# Patient Record
Sex: Female | Born: 1965 | Race: White | Hispanic: No | Marital: Married | Smoking: Never smoker
Health system: Southern US, Community
[De-identification: ages and names within clinical notes are randomized; demographics above are authoritative.]

## PROBLEM LIST (undated history)

## (undated) DIAGNOSIS — F32A Depression, unspecified: Secondary | ICD-10-CM

## (undated) DIAGNOSIS — K219 Gastro-esophageal reflux disease without esophagitis: Secondary | ICD-10-CM

## (undated) DIAGNOSIS — F329 Major depressive disorder, single episode, unspecified: Secondary | ICD-10-CM

## (undated) DIAGNOSIS — D649 Anemia, unspecified: Secondary | ICD-10-CM

## (undated) DIAGNOSIS — F419 Anxiety disorder, unspecified: Secondary | ICD-10-CM

## (undated) HISTORY — DX: Gastro-esophageal reflux disease without esophagitis: K21.9

## (undated) HISTORY — DX: Anemia, unspecified: D64.9

## (undated) HISTORY — DX: Depression, unspecified: F32.A

## (undated) HISTORY — DX: Major depressive disorder, single episode, unspecified: F32.9

## (undated) HISTORY — DX: Anxiety disorder, unspecified: F41.9

---

## 2005-09-28 ENCOUNTER — Ambulatory Visit: Payer: Self-pay | Admitting: Internal Medicine

## 2006-04-21 ENCOUNTER — Ambulatory Visit: Payer: Self-pay | Admitting: Internal Medicine

## 2006-05-09 ENCOUNTER — Inpatient Hospital Stay (HOSPITAL_COMMUNITY): Admission: AD | Admit: 2006-05-09 | Discharge: 2006-05-11 | Payer: Self-pay | Admitting: Internal Medicine

## 2006-05-09 ENCOUNTER — Ambulatory Visit: Payer: Self-pay | Admitting: Internal Medicine

## 2006-05-10 ENCOUNTER — Ambulatory Visit: Payer: Self-pay | Admitting: Internal Medicine

## 2006-05-18 ENCOUNTER — Ambulatory Visit: Payer: Self-pay | Admitting: Internal Medicine

## 2006-12-22 ENCOUNTER — Ambulatory Visit: Payer: Self-pay | Admitting: Internal Medicine

## 2006-12-22 LAB — CONVERTED CEMR LAB
Basophils Absolute: 0.1 10*3/uL (ref 0.0–0.1)
Basophils Relative: 1.4 % — ABNORMAL HIGH (ref 0.0–1.0)
Eosinophils Absolute: 0.5 10*3/uL (ref 0.0–0.6)
Eosinophils Relative: 8.9 % — ABNORMAL HIGH (ref 0.0–5.0)
HCT: 29.5 % — ABNORMAL LOW (ref 36.0–46.0)
Hemoglobin: 9.7 g/dL — ABNORMAL LOW (ref 12.0–15.0)
Iron: 18 ug/dL — ABNORMAL LOW (ref 42–145)
Lymphocytes Relative: 35.1 % (ref 12.0–46.0)
MCHC: 33 g/dL (ref 30.0–36.0)
MCV: 70.5 fL — ABNORMAL LOW (ref 78.0–100.0)
Monocytes Absolute: 0.5 10*3/uL (ref 0.2–0.7)
Monocytes Relative: 8.7 % (ref 3.0–11.0)
Neutro Abs: 2.6 10*3/uL (ref 1.4–7.7)
Neutrophils Relative %: 45.9 % (ref 43.0–77.0)
Platelets: 313 10*3/uL (ref 150–400)
RBC: 4.24 M/uL (ref 3.87–5.11)
RDW: 15.4 % — ABNORMAL HIGH (ref 11.5–14.6)
TSH: 2.39 microintl units/mL (ref 0.35–5.50)
WBC: 5.7 10*3/uL (ref 4.5–10.5)

## 2007-07-28 ENCOUNTER — Encounter: Payer: Self-pay | Admitting: *Deleted

## 2007-07-28 DIAGNOSIS — K2900 Acute gastritis without bleeding: Secondary | ICD-10-CM | POA: Insufficient documentation

## 2007-07-28 DIAGNOSIS — Z8719 Personal history of other diseases of the digestive system: Secondary | ICD-10-CM | POA: Insufficient documentation

## 2007-07-28 DIAGNOSIS — K2100 Gastro-esophageal reflux disease with esophagitis, without bleeding: Secondary | ICD-10-CM | POA: Insufficient documentation

## 2007-07-28 DIAGNOSIS — M797 Fibromyalgia: Secondary | ICD-10-CM | POA: Insufficient documentation

## 2007-07-28 DIAGNOSIS — K449 Diaphragmatic hernia without obstruction or gangrene: Secondary | ICD-10-CM | POA: Insufficient documentation

## 2007-07-28 DIAGNOSIS — Z87898 Personal history of other specified conditions: Secondary | ICD-10-CM | POA: Insufficient documentation

## 2007-07-28 DIAGNOSIS — K219 Gastro-esophageal reflux disease without esophagitis: Secondary | ICD-10-CM | POA: Insufficient documentation

## 2007-07-28 DIAGNOSIS — F329 Major depressive disorder, single episode, unspecified: Secondary | ICD-10-CM | POA: Insufficient documentation

## 2007-08-09 ENCOUNTER — Ambulatory Visit: Payer: Self-pay | Admitting: Internal Medicine

## 2007-08-09 DIAGNOSIS — F418 Other specified anxiety disorders: Secondary | ICD-10-CM | POA: Insufficient documentation

## 2007-08-09 DIAGNOSIS — F411 Generalized anxiety disorder: Secondary | ICD-10-CM | POA: Insufficient documentation

## 2007-08-09 DIAGNOSIS — D509 Iron deficiency anemia, unspecified: Secondary | ICD-10-CM | POA: Insufficient documentation

## 2007-08-10 LAB — CONVERTED CEMR LAB
BUN: 9 mg/dL (ref 6–23)
Basophils Absolute: 0.1 10*3/uL (ref 0.0–0.1)
Basophils Relative: 1.4 % — ABNORMAL HIGH (ref 0.0–1.0)
CO2: 27 meq/L (ref 19–32)
Calcium: 9 mg/dL (ref 8.4–10.5)
Chloride: 106 meq/L (ref 96–112)
Creatinine, Ser: 0.9 mg/dL (ref 0.4–1.2)
Eosinophils Absolute: 0.3 10*3/uL (ref 0.0–0.7)
Eosinophils Relative: 4.2 % (ref 0.0–5.0)
GFR calc Af Amer: 89 mL/min
GFR calc non Af Amer: 74 mL/min
Glucose, Bld: 98 mg/dL (ref 70–99)
HCT: 32.4 % — ABNORMAL LOW (ref 36.0–46.0)
Hemoglobin: 10.3 g/dL — ABNORMAL LOW (ref 12.0–15.0)
Iron: 20 ug/dL — ABNORMAL LOW (ref 42–145)
Lymphocytes Relative: 27.7 % (ref 12.0–46.0)
MCHC: 31.9 g/dL (ref 30.0–36.0)
MCV: 69 fL — ABNORMAL LOW (ref 78.0–100.0)
Monocytes Absolute: 0.5 10*3/uL (ref 0.1–1.0)
Monocytes Relative: 8.4 % (ref 3.0–12.0)
Neutro Abs: 3.6 10*3/uL (ref 1.4–7.7)
Neutrophils Relative %: 58.3 % (ref 43.0–77.0)
Platelets: 344 10*3/uL (ref 150–400)
Potassium: 4.4 meq/L (ref 3.5–5.1)
RBC: 4.69 M/uL (ref 3.87–5.11)
RDW: 16.9 % — ABNORMAL HIGH (ref 11.5–14.6)
Saturation Ratios: 4 % — ABNORMAL LOW (ref 20.0–50.0)
Sodium: 140 meq/L (ref 135–145)
Transferrin: 358.6 mg/dL (ref >=212.0)
WBC: 6.2 10*3/uL (ref 4.5–10.5)

## 2008-02-13 ENCOUNTER — Telehealth: Payer: Self-pay | Admitting: Internal Medicine

## 2008-04-29 ENCOUNTER — Ambulatory Visit: Payer: Self-pay | Admitting: Internal Medicine

## 2008-04-29 ENCOUNTER — Encounter: Payer: Self-pay | Admitting: Internal Medicine

## 2008-04-29 DIAGNOSIS — R079 Chest pain, unspecified: Secondary | ICD-10-CM | POA: Insufficient documentation

## 2008-05-08 ENCOUNTER — Ambulatory Visit: Payer: Self-pay | Admitting: Internal Medicine

## 2008-09-04 ENCOUNTER — Telehealth: Payer: Self-pay | Admitting: Internal Medicine

## 2008-12-04 ENCOUNTER — Encounter: Payer: Self-pay | Admitting: Internal Medicine

## 2009-01-20 ENCOUNTER — Ambulatory Visit: Payer: Self-pay | Admitting: Internal Medicine

## 2009-01-20 DIAGNOSIS — R209 Unspecified disturbances of skin sensation: Secondary | ICD-10-CM | POA: Insufficient documentation

## 2009-01-20 DIAGNOSIS — R5383 Other fatigue: Secondary | ICD-10-CM | POA: Insufficient documentation

## 2009-01-23 LAB — CONVERTED CEMR LAB
ALT: 14 units/L (ref 0–35)
AST: 19 units/L (ref 0–37)
Albumin: 4 g/dL (ref 3.5–5.2)
Alkaline Phosphatase: 54 units/L (ref 39–117)
BUN: 10 mg/dL (ref 6–23)
Basophils Absolute: 0.1 10*3/uL (ref 0.0–0.1)
Basophils Relative: 0.7 % (ref 0.0–3.0)
Bilirubin Urine: NEGATIVE
Bilirubin, Direct: 0 mg/dL (ref 0.0–0.3)
CO2: 28 meq/L (ref 19–32)
Calcium: 9 mg/dL (ref 8.4–10.5)
Chloride: 105 meq/L (ref 96–112)
Creatinine, Ser: 0.8 mg/dL (ref 0.4–1.2)
Eosinophils Absolute: 0.2 10*3/uL (ref 0.0–0.7)
Eosinophils Relative: 2.9 % (ref 0.0–5.0)
Ferritin: 3 ng/mL — ABNORMAL LOW (ref 10.0–291.0)
GFR calc non Af Amer: 83.73 mL/min (ref >60)
Glucose, Bld: 80 mg/dL (ref 70–99)
HCT: 31.8 % — ABNORMAL LOW (ref 36.0–46.0)
Hemoglobin: 10.1 g/dL — ABNORMAL LOW (ref 12.0–15.0)
Ketones, ur: NEGATIVE mg/dL
Leukocytes, UA: NEGATIVE
Lymphocytes Relative: 28.5 % (ref 12.0–46.0)
Lymphs Abs: 2.3 10*3/uL (ref 0.7–4.0)
MCHC: 31.7 g/dL (ref 30.0–36.0)
MCV: 71.5 fL — ABNORMAL LOW (ref 78.0–100.0)
Monocytes Absolute: 0.6 10*3/uL (ref 0.1–1.0)
Monocytes Relative: 7.3 % (ref 3.0–12.0)
Neutro Abs: 4.9 10*3/uL (ref 1.4–7.7)
Neutrophils Relative %: 60.6 % (ref 43.0–77.0)
Nitrite: NEGATIVE
Platelets: 351 10*3/uL (ref 150.0–400.0)
Potassium: 3.8 meq/L (ref 3.5–5.1)
RBC: 4.45 M/uL (ref 3.87–5.11)
RDW: 15.1 % — ABNORMAL HIGH (ref 11.5–14.6)
Sed Rate: 26 mm/hr — ABNORMAL HIGH (ref 0–22)
Sodium: 141 meq/L (ref 135–145)
Specific Gravity, Urine: 1.025 (ref 1.000–1.030)
TSH: 3.19 microintl units/mL (ref 0.35–5.50)
Total Bilirubin: 0.7 mg/dL (ref 0.3–1.2)
Total Protein, Urine: NEGATIVE mg/dL
Total Protein: 7.8 g/dL (ref 6.0–8.3)
Urine Glucose: NEGATIVE mg/dL
Urobilinogen, UA: 0.2 (ref 0.0–1.0)
Vitamin B-12: 224 pg/mL (ref 211–911)
WBC: 8.1 10*3/uL (ref 4.5–10.5)
pH: 6 (ref 5.0–8.0)

## 2009-02-03 ENCOUNTER — Telehealth: Payer: Self-pay | Admitting: Internal Medicine

## 2009-02-06 ENCOUNTER — Ambulatory Visit: Payer: Self-pay | Admitting: Internal Medicine

## 2009-02-27 ENCOUNTER — Telehealth: Payer: Self-pay | Admitting: Internal Medicine

## 2010-06-04 NOTE — Progress Notes (Signed)
Summary: zoloft  Phone Note Other Incoming Call back at fax   [facility] [phone] Summary of Call: reqest #90 supply----ok. per dr. Derrill Kay Initial call taken by: Tora Perches,  February 27, 2009 2:29 PM    Prescriptions: ZOLOFT 100 MG TABS (SERTRALINE HCL) 1 by mouth daily  #90 x 3   Entered by:   Tora Perches   Authorized by:   [name] MD   Signed by:   Tora Perches on 02/27/2009   Method used:   [facility]        [facility]  [address] (retail)       [address]       [place], [place]  [address]       Ph: [phone] or [phone]       Fax: [phone]   RxID:   [id number]

## 2010-06-04 NOTE — Assessment & Plan Note (Signed)
Summary: ROA/AWARE OF FEE/NML   Vital Signs:  Patient Profile:   45 Years Old Female Weight:      206 pounds Temp:     96.8 degrees F oral Pulse rate:   81 / minute BP sitting:   121 / 88  (left arm)  Vitals Entered By: Tora Perches (August 09, 2007 11:33 AM)             Is Patient Diabetic? No     Chief Complaint:  Multiple medical problems or concerns.  History of Present Illness: The patient presents for a follow up of back pain, anemia, depression and headaches.     Current Allergies: No known allergies   Past Medical History:    Reviewed history from 07/28/2007 and no changes required:       GASTROINTESTINAL HEMORRHAGE, HX OF (ICD-V12.79)       Hx of GASTRITIS, ACUTE (ICD-535.00)       HIATAL HERNIA (ICD-553.3)       ESOPHAGITIS, REFLUX (ICD-530.11)       * PREMENSTRUAL DISORDER       HEADACHES, HX OF (ICD-V13.8)       FIBROMYALGIA (ICD-729.1)       DEPRESSION (ICD-311)       GERD (ICD-530.81)               Anemia-iron deficiency       Anxiety   Family History:    M OA, DM, HTN    F Parkinson's  Social History:    Occupation: Investment banker, operational    Married    Never Smoked   Risk Factors:  Tobacco use:  never    Physical Exam  General:     Well-developed,well-nourished,in no acute distress; alert,appropriate and cooperative throughout examination Eyes:     No corneal or conjunctival inflammation noted. EOMI. Perrla. Funduscopic exam benign, without hemorrhages, exudates or papilledema. Vision grossly normal. Ears:     External ear exam shows no significant lesions or deformities.  Otoscopic examination reveals clear canals, tympanic membranes are intact bilaterally without bulging, retraction, inflammation or discharge. Hearing is grossly normal bilaterally. Nose:     External nasal examination shows no deformity or inflammation. Nasal mucosa are pink and moist without lesions or exudates. Mouth:     Oral mucosa and oropharynx without lesions or  exudates.  Teeth in good repair. Neck:     No deformities, masses, or tenderness noted. Lungs:     Normal respiratory effort, chest expands symmetrically. Lungs are clear to auscultation, no crackles or wheezes. Heart:     Normal rate and regular rhythm. S1 and S2 normal without gallop, murmur, click, rub or other extra sounds. Abdomen:     Bowel sounds positive,abdomen soft and non-tender without masses, organomegaly or hernias noted. Msk:     No deformity or scoliosis noted of thoracic or lumbar spine.   Extremities:     No clubbing, cyanosis, edema, or deformity noted with normal full range of motion of all joints.   Neurologic:     No cranial nerve deficits noted. Station and gait are normal. Plantar reflexes are down-going bilaterally. DTRs are symmetrical throughout. Sensory, motor and coordinative functions appear intact. Skin:     Intact without suspicious lesions or rashes Psych:     Oriented X3, normally interactive, good eye contact, and depressed affect.      Impression & Recommendations:  Problem # 1:  DEPRESSION (ICD-311) Assessment: Deteriorated  Her updated medication list for this problem includes:  Fluoxetine Hcl 10 Mg Tabs (Fluoxetine hcl) .Marland Kitchen... Take once daily   Problem # 2:  GERD (ICD-530.81) Assessment: Unchanged  Her updated medication list for this problem includes:    Protonix 40 Mg Tbec (Pantoprazole sodium) .Marland Kitchen... Take once daily   Problem # 3:  ANEMIA-IRON DEFICIENCY (ICD-280.9) Assessment: Comment Only  Orders: TLB-CBC Platelet - w/Differential (85025-CBCD) TLB-BMP (Basic Metabolic Panel-BMET) (80048-METABOL) TLB-IBC Pnl(Iron/FE;Transferrin) (83550-IBC)   Problem # 4:  * PREMENSTRUAL DISORDER Assessment: Unchanged Gyn apt next wk  Complete Medication List: 1)  Fluoxetine Hcl 10 Mg Tabs (Fluoxetine hcl) .... Take once daily 2)  Protonix 40 Mg Tbec (Pantoprazole sodium) .... Take once daily 3)  Vitamin D3 1000 Unit Tabs  (Cholecalciferol) .Marland Kitchen.. 1 by mouth daily   Patient Instructions: 1)  Please schedule a follow-up appointment in 3 months.    Prescriptions: PROTONIX 40 MG  TBEC (PANTOPRAZOLE SODIUM) Take once daily  #30 x 12   Entered and Authorized by:   [name] MD   Signed by:   [name] MD on 08/09/2007   Method used:   Print then Give to Patient   RxID:   [id number] FLUOXETINE HCL 10 MG  TABS (FLUOXETINE HCL) Take once daily  #30 x 6   Entered and Authorized by:   [name] MD   Signed by:   [name] MD on 08/09/2007   Method used:   Print then Give to Patient   RxID:   [id number]  ]

## 2010-06-04 NOTE — Progress Notes (Signed)
Summary: Paperwork  Phone Note Call from Patient Call back at Home Phone [phone]   Caller: Daughter Call For: [name] MD Summary of Call: Patient's daughter dropped off paperwork to be completed by Dr. Posey Rea. It needs to be mailed in the enclosed envelope when completed. Gave to Triage A. Initial call taken by: Irma Newness,  February 03, 2009 4:05 PM  Follow-up for Phone Call        Put paperwork on md desk Follow-up by: Orlan Leavens,  February 05, 2009 8:27 AM  Additional Follow-up for Phone Call Additional follow up Details #1::        Done Additional Follow-up by: [name] MD,  February 06, 2009 4:26 PM    Additional Follow-up for Phone Call Additional follow up Details #2::    Paperwork mailed. Copy sent to medical records to scan into chart. Follow-up by: Irma Newness,  February 07, 2009 9:08 AM

## 2010-06-04 NOTE — Progress Notes (Signed)
Summary: Work Note  Phone Note From Other Clinic   Caller: Elizabeth HR at [employer] Ph# [phone] Summary of Call: PT's HR is requesting a note that states pt is unable to climb ladders at work b/c of dizzyness, to be faxed to [phone]. Note needs to state a date that this ends. Initial call taken by: Lamar Sprinkles,  February 13, 2008 9:37 AM  Follow-up for Phone Call        OK to give x 1 year. Thx Follow-up by: [name] MD,  February 13, 2008 11:50 AM  Additional Follow-up for Phone Call Additional follow up Details #1::        Note to be faxed today Additional Follow-up by: Lamar Sprinkles,  February 13, 2008 1:56 PM

## 2010-06-04 NOTE — Letter (Signed)
Summary: Out of Work  [facility]  [address] [place], [place] [address]   Phone: [phone]  Fax: [phone]    April 29, 2008   Employee:  Richel Nelva Bush    To Whom It May Concern:   For Medical reasons, please excuse the above named employee from work for the following dates:  Start:   sun April 28, 2008  End:   sat May 04, 2008 ---   to return to work sun May 05, 2008  If you need additional information, please feel free to contact our office.         Sincerely,    Corwin Levins MD

## 2010-06-04 NOTE — Progress Notes (Signed)
Summary: PROTONIX  Phone Note Refill Request Message from:  Fax from Pharmacy on Sep 04, 2008 1:54 PM  Refills Requested: Medication #1:  PROTONIX 40 MG  TBEC Take 1 once daily   [facility]: [facility] [address] [phone]   Method Requested: Electronic Initial call taken by: Orlan Leavens,  Sep 04, 2008 1:54 PM      Prescriptions: PROTONIX 40 MG  TBEC (PANTOPRAZOLE SODIUM) Take 1 once daily  #30 x 3   Entered by:   Orlan Leavens   Authorized by:   [name] MD   Signed by:   Orlan Leavens on 09/04/2008   Method used:   [facility]        [facility]  [address] (retail)       [address]       [place], [place]  [address]       Ph: [phone] or [phone]       Fax: [phone]   RxID:   [id number]

## 2010-06-04 NOTE — Miscellaneous (Signed)
Summary: sertraline  Clinical Lists Changes  Medications: Rx of ZOLOFT 50 MG TABS (SERTRALINE HCL) 1 by mouth qd;  #30 x 5;  Signed;  Entered by: Tora Perches;  Authorized by: [name] MD;  Method used: [facility]  [address] [place], [place]  [address], Ph: [phone] or [phone], Fax: [phone]    Prescriptions: ZOLOFT 50 MG TABS (SERTRALINE HCL) 1 by mouth qd  #30 x 5   Entered by:   Tora Perches   Authorized by:   [name] MD   Signed by:   Tora Perches on 12/04/2008   Method used:   [facility]ronically [facility]        [facility]  [address] (retail)       [address]       [place], [place]  [address]       Ph: [phone] or [phone]       Fax: [phone]   RxID:   [id number]

## 2010-06-04 NOTE — Assessment & Plan Note (Signed)
Summary: wants to "talk" to dr plot/$50/cd   Vital Signs:  Patient Profile:   45 Years Old Female Weight:      205 pounds Temp:     97.4 degrees F oral Pulse rate:   80 / minute BP sitting:   116 / 84  (left arm)  Vitals Entered By: Tora Perches (May 08, 2008 4:11 PM)                 Chief Complaint:  Multiple medical problems or concerns.  History of Present Illness: C/o stress. C/o anxiety, can't focus well. Depressed. Had a CP episode x 1 F/u CP    Current Allergies: No known allergies   Past Medical History:    Reviewed history from 04/29/2008 and no changes required:       GASTROINTESTINAL HEMORRHAGE, HX OF (ICD-V12.79)       Hx of GASTRITIS, ACUTE (ICD-535.00)       HIATAL HERNIA (ICD-553.3)       ESOPHAGITIS, REFLUX (ICD-530.11)       * PREMENSTRUAL DISORDER       HEADACHES, HX OF (ICD-V13.8)       FIBROMYALGIA (ICD-729.1)       DEPRESSION (ICD-311)       GERD (ICD-530.81)       Anemia-iron deficiency       Anxiety   Family History:    Reviewed history from 08/09/2007 and no changes required:       M OA, DM, HTN       F Parkinson's  Social History:    Reviewed history from 04/29/2008 and no changes required:       Occupation: Investment banker, 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   Nose:     External nasal examination shows no deformity or inflammation. Nasal mucosa are pink and moist without lesions or exudates. Mouth:     Oral mucosa and oropharynx without lesions or exudates.  Teeth in good repair. Neck:     No deformities, masses, or tenderness noted. Lungs:     Normal respiratory effort, chest expands symmetrically. Lungs are clear to auscultation, no crackles or wheezes. Heart:     Normal rate and regular rhythm. S1 and S2 normal without gallop, murmur, click, rub or other extra sounds. Abdomen:     Bowel  sounds positive,abdomen soft and non-tender without masses, organomegaly or hernias noted. Msk:     tender left lower costal margin and left upper chest wall tender  Neurologic:     No cranial nerve deficits noted. Station and gait are normal. Plantar reflexes are down-going bilaterally. DTRs are symmetrical throughout. Sensory, motor and coordinative functions appear intact. Skin:     Intact without suspicious lesions or rashes Psych:     flat affect and moderately anxious.  depressed affect.  Not suicidal    Impression & Recommendations:  Problem # 1:  CHEST PAIN (ICD-786.50) due to #1 resolved Assessment: Comment Only  Problem # 2:  ANXIETY (ICD-300.00) Assessment: Deteriorated Stress discussed The following medications were removed from the medication list:    Fluoxetine Hcl 10 Mg Tabs (Fluoxetine hcl) .Marland Kitchen... Take once daily  Her updated medication list for this problem includes:    Lorazepam 0.5 Mg Tabs (Lorazepam) .Marland KitchenMarland KitchenMarland KitchenMarland Kitchen  1-2 by mouth two times a day as needed anxiety    Zoloft 50 Mg Tabs (Sertraline hcl) .Marland Kitchen... 1 by mouth qd   Problem # 3:  ANEMIA-IRON DEFICIENCY (ICD-280.9)  Her updated medication list for this problem includes:    Ferrous Sulfate 325 (65 Fe) Mg Tbec (Ferrous sulfate) .Marland Kitchen... 1 by mouth once daily with 100 mg of vit c   Problem # 4:  FIBROMYALGIA (ICD-729.1) Assessment: Unchanged  Her updated medication list for this problem includes:    Naproxen 500 Mg Tabs (Naproxen) .Marland Kitchen... 1 by mouth two times a day as needed   Problem # 5:  DEPRESSION (ICD-311) Assessment: Deteriorated  The following medications were removed from the medication list:    Fluoxetine Hcl 10 Mg Tabs (Fluoxetine hcl) .Marland Kitchen... Take once daily  Her updated medication list for this problem includes:    Lorazepam 0.5 Mg Tabs (Lorazepam) .Marland Kitchen... 1-2 by mouth two times a day as needed anxiety    Zoloft 50 Mg Tabs (Sertraline hcl) .Marland Kitchen... 1 by mouth qd   Complete Medication List: 1)  Protonix  40 Mg Tbec (Pantoprazole sodium) .... Take 1 once daily 2)  Naproxen 500 Mg Tabs (Naproxen) .Marland Kitchen.. 1 by mouth two times a day as needed 3)  Lorazepam 0.5 Mg Tabs (Lorazepam) .Marland Kitchen.. 1-2 by mouth two times a day as needed anxiety 4)  Zoloft 50 Mg Tabs (Sertraline hcl) .Marland Kitchen.. 1 by mouth qd 5)  Vitamin D3 1000 Unit Tabs (Cholecalciferol) .Marland Kitchen.. 1 by mouth daily 6)  Ferrous Sulfate 325 (65 Fe) Mg Tbec (Ferrous sulfate) .Marland Kitchen.. 1 by mouth once daily with 100 mg of vit c   Patient Instructions: 1)  Please schedule a follow-up appointment in 3 months. 2)  BMP prior to visit, ICD-9: 3)  Hepatic Panel prior to visit, ICD-9: 4)  TSH prior to visit, ICD-9: 5)  CBC w/ Diff prior to visit, ICD-9: 6)  iron   Prescriptions: LORAZEPAM 0.5 MG TABS (LORAZEPAM) 1-2 by mouth two times a day as needed anxiety  #60 x 3   Entered and Authorized by:   [name] MD   Signed by:   [name] MD on 05/08/2008   Method used:   Print then Give to Patient   RxID:   [id number] PROTONIX 40 MG  TBEC (PANTOPRAZOLE SODIUM) Take 1 once daily  #90 x 3   Entered and Authorized by:   [name] MD   Signed by:   [name] MD on 05/08/2008   Method used:   Print then Give to Patient   RxID:   [id number] ZOLOFT 50 MG TABS (SERTRALINE HCL) 1 by mouth qd  #30 x 6   Entered and Authorized by:   [name] MD   Signed by:   [name] MD on 05/08/2008   Method used:   Print then Give to Patient   RxID:   [id number]  ]

## 2010-06-04 NOTE — Assessment & Plan Note (Signed)
Summary: depression/cd   Vital Signs:  Patient profile:   45 year old female Height:      62 inches Weight:      187 pounds BMI:     34.33 Temp:     97.3 degrees F oral Pulse rate:   80 / minute BP sitting:   124 / 84  (left arm)  Vitals Entered By: Tora Perches (January 20, 2009 4:54 PM) CC: depression Is Patient Diabetic? No   CC:  depression.  History of Present Illness: C/o fatigue and depression - worse, no energy She could not work for a wk due to weakness  Current Medications (verified): 1)  Protonix 40 Mg  Tbec (Pantoprazole Sodium) .... Take 1 Once Daily 2)  Naproxen 500 Mg Tabs (Naproxen) .Marland Kitchen.. 1 By Mouth Two Times A Day As Needed 3)  Lorazepam 0.5 Mg Tabs (Lorazepam) .Marland Kitchen.. 1-2 By Mouth Two Times A Day As Needed Anxiety 4)  Zoloft 50 Mg Tabs (Sertraline Hcl) .Marland Kitchen.. 1 By Mouth Qd 5)  Vitamin D3 1000 Unit  Tabs (Cholecalciferol) .Marland Kitchen.. 1 By Mouth Daily 6)  Ferrous Sulfate 325 (65 Fe) Mg Tbec (Ferrous Sulfate) .Marland Kitchen.. 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   Mouth:  Oral mucosa and oropharynx without lesions or exudates.  Teeth in good repair. Lungs:  Normal respiratory effort, chest expands symmetrically. Lungs are clear to auscultation, no crackles or wheezes. Heart:  Normal rate and regular rhythm. S1 and S2 normal without gallop, murmur, click, rub or other  extra sounds. Abdomen:  Bowel sounds positive,abdomen soft and non-tender without masses, organomegaly or hernias noted. Msk:  tender left lower costal margin and left upper chest wall tender  Extremities:  no edema, no ulcers  Neurologic:  No cranial nerve deficits noted. Station and gait are normal. Plantar reflexes are down-going bilaterally. DTRs are symmetrical throughout. Sensory, motor and coordinative functions appear intact. Skin:  Intact without suspicious lesions or rashes Psych:  flat affect and moderately anxious.  depressed affect.  Not suicidal   Impression & Recommendations:  Problem # 1:  FATIGUE (ICD-780.79) Assessment Comment Only On prescription therapy  Orders: TLB-B12, Serum-Total ONLY ([address]-U04) TLB-BMP (Basic Metabolic Panel-BMET) (80048-METABOL) TLB-CBC Platelet - w/Differential (85025-CBCD) TLB-Hepatic/Liver Function Pnl (80076-HEPATIC) TLB-Sedimentation Rate (ESR) (85652-ESR) TLB-TSH (Thyroid Stimulating Hormone) (84443-TSH) TLB-Udip ONLY (81003-UDIP) TLB-Ferritin (82728-FER)  Problem # 2:  FIBROMYALGIA (ICD-729.1) Assessment: Deteriorated  Her updated medication list for this problem includes:    Naproxen 500 Mg Tabs (Naproxen) .Marland Kitchen... 1 by mouth two times a day as needed    Cyclobenzaprine Hcl 10 Mg Tabs (Cyclobenzaprine hcl) .Marland Kitchen... 1 by mouth 2 times daily as needed for pain  Orders: TLB-B12, Serum-Total ONLY (54098-J19) TLB-BMP (Basic Metabolic Panel-BMET) (80048-METABOL) TLB-CBC Platelet - w/Differential (85025-CBCD) TLB-Hepatic/Liver Function Pnl (80076-HEPATIC) TLB-Sedimentation Rate (ESR) (85652-ESR) TLB-TSH (Thyroid Stimulating Hormone) (84443-TSH) TLB-Udip ONLY (81003-UDIP) TLB-Ferritin (82728-FER)  Problem # 3:  PARESTHESIA (ICD-782.0) R>>L hand Assessment: New  Orders: TLB-B12, Serum-Total ONLY (14782-N56) TLB-BMP (Basic Metabolic Panel-BMET) (80048-METABOL) TLB-CBC Platelet - w/Differential (85025-CBCD) TLB-Hepatic/Liver Function  Pnl (80076-HEPATIC) TLB-Sedimentation Rate (ESR) (85652-ESR) TLB-TSH (Thyroid Stimulating Hormone) (84443-TSH) TLB-Udip ONLY (81003-UDIP) TLB-Ferritin (82728-FER) Splints- All Types (Y8657)  Problem # 4:  ANEMIA-IRON DEFICIENCY (ICD-280.9) Assessment: Comment Only  Her updated medication list for this problem includes:    Ferrous Sulfate 325 (65 Fe) Mg Tbec (Ferrous sulfate) .Marland Kitchen... 1 by mouth once daily with 100 mg of vit c    Vitamin B-12 Cr 1000 Mcg Tbcr (Cyanocobalamin) .Marland Kitchen... Take one tablet by mouth daily  Complete Medication List: 1)  Protonix 40 Mg Tbec (Pantoprazole sodium) .... Take 1 once daily 2)  Naproxen 500 Mg Tabs (Naproxen) .Marland Kitchen.. 1 by mouth two times a day as needed 3)  Lorazepam 0.5 Mg Tabs (Lorazepam) .Marland Kitchen.. 1-2 by mouth two times a day as needed anxiety 4)  Ferrous Sulfate 325 (65 Fe) Mg Tbec (Ferrous sulfate) .Marland Kitchen.. 1 by mouth once daily with 100 mg of vit c 5)  Cyclobenzaprine Hcl 10 Mg Tabs (Cyclobenzaprine hcl) .Marland Kitchen.. 1 by mouth 2 times daily as needed for pain 6)  Zoloft 100 Mg Tabs (Sertraline hcl) .Marland Kitchen.. 1 by mouth daily 7)  Vitamin D (ergocalciferol) 50000 Unit Caps (Ergocalciferol) .Marland Kitchen.. 1 by mouth q 1 month 8)  Vitamin B-12 Cr 1000 Mcg Tbcr (Cyanocobalamin) .... Take one tablet by mouth daily  Patient Instructions: 1)  Try to eat more raw plant food, fresh and dry fruit, raw almonds, leafy vegetables, whole foods and less red meat, less animal fat. Poultry and fish is better for you than pork and beef. Avoid processed foods (canned soups, hot dogs, sausage, bacon , frozen dinners). Avoid corn syrup, high fructose syrup or aspartam and Splenda  containing drinks. Honey, Agave and Stevia are better sweeteners. Make your own  dressing with olive oil, wine vinegar, lemon juce, garlic etc. for your salads. 2)  Use stretching exercises that I have provided (15 min. or longer every day)  3)  Please schedule a follow-up appointment in 2 months. 4)  To work 9/21 (sick since  9/14) 5)  Call if you are not better in a reasonable amount of time or if worse.  Prescriptions: VITAMIN B-12 CR 1000 MCG  TBCR (CYANOCOBALAMIN) Take one tablet by mouth daily  #30 x 12   Entered and Authorized by:   [name] MD   Signed by:   [name] MD on 01/21/2009   Method used:   Print then Give to Patient   RxID:   [id number] VITAMIN D (ERGOCALCIFEROL) 50000 UNIT CAPS (ERGOCALCIFEROL) 1 by mouth q 1 month  #3 x 4   Entered and Authorized by:   [name] MD   Signed by:   [name] MD on 01/20/2009   Method used:   Print then Give to Patient   RxID:   [id number] ZOLOFT 100 MG TABS (SERTRALINE HCL) 1 by mouth daily  #30 x 6   Entered and Authorized by:   [name] MD   Signed by:   [name] MD on 01/20/2009   Method used:   Print then Give to Patient   RxID:   [id number] CYCLOBENZAPRINE HCL 10 MG  TABS (CYCLOBENZAPRINE HCL) 1 by mouth 2 times daily as needed for pain  #30 x 6   Entered and Authorized by:   [name] MD   Signed by:   [name] MD on 01/20/2009   Method used:   Print then Give to Patient   RxID:   [id number]

## 2010-06-04 NOTE — Assessment & Plan Note (Signed)
Summary: NAUSEA/CHEST PRESSURE/DIZZY/CHEST PAIN/ NWS   Vital Signs:  Patient Profile:   45 Years Old Female Weight:      200 pounds O2 Sat:      97 % O2 treatment:    Room Air Temp:     96.5 degrees F oral Pulse rate:   80 / minute BP sitting:   130 / 90  (left arm) Cuff size:   regular  Vitals Entered By: Payton Spark CMA (April 29, 2008 1:42 PM)                 Chief Complaint:  chest pain and pressure/nausea/dizzy x 3 days.  History of Present Illness: here with pain to the upper and left mid chest burning pressure for 4 days since xmas eve, constant but better and worse - at the worst is about 7/10; no radaition except to the left upper back on occasion, assoc with some sob when the pain is worse, no nausea , diaphoresis, and palp's, syncope; non exertional it seems; moveement of the left shoulder does not affect it; she has valeria root med from her country ([place]) but it makes her sleepy;p no trauma or falls, has some dryness of the throat but no cough or DOE, no orthopnea, pnd or worsening LE edema; pain is better it seems to deep inspiration; is taking now antacid in the form of protonix;  has had 5 lbs wt loss recently she thinks due to cutting back on calories; did have EGD earlier this year    Updated Prior Medication List: FLUOXETINE HCL 10 MG  TABS (FLUOXETINE HCL) Take once daily PROTONIX 40 MG  TBEC (PANTOPRAZOLE SODIUM) Take once daily VITAMIN D3 1000 UNIT  TABS (CHOLECALCIFEROL) 1 by mouth daily NAPROXEN 500 MG TABS (NAPROXEN) 1 by mouth two times a day as needed  Current Allergies (reviewed today): No known allergies   Past Medical History:    Reviewed history from 08/09/2007 and no changes required:       GASTROINTESTINAL HEMORRHAGE, HX OF (ICD-V12.79)       Hx of GASTRITIS, ACUTE (ICD-535.00)       HIATAL HERNIA (ICD-553.3)       ESOPHAGITIS, REFLUX (ICD-530.11)       * PREMENSTRUAL DISORDER       HEADACHES, HX OF (ICD-V13.8)       FIBROMYALGIA  (ICD-729.1)       DEPRESSION (ICD-311)       GERD (ICD-530.81)       Anemia-iron deficiency       Anxiety  Past Surgical History:    Reviewed history and no changes required:       Denies surgical history   Social History:    Reviewed history from 08/09/2007 and no changes required:       Occupation: Investment banker, [employer] [place] [employer]       Married       Never Smoked       Alcohol use-no       3 children   Risk Factors:  Alcohol use:  no   Review of Systems       all otherwise negative , denies worsening depression or anxiety recently   Physical Exam  General:     alert and overweight-appearing.   Head:     Normocephalic and atraumatic without obvious abnormalities. No apparent alopecia or balding. Eyes:     No corneal or conjunctival inflammation noted. EOMI. Perrla.  Ears:     External ear exam shows  no significant lesions or deformities.  Otoscopic examination reveals clear canals, tympanic membranes are intact bilaterally without bulging, retraction, inflammation or discharge. Hearing is grossly normal bilaterally. Nose:     External nasal examination shows no deformity or inflammation. Nasal mucosa are pink and moist without lesions or exudates. Mouth:     Oral mucosa and oropharynx without lesions or exudates.  Teeth in good repair. Neck:     No deformities, masses, or tenderness noted. Lungs:     Normal respiratory effort, chest expands symmetrically. Lungs are clear to auscultation, no crackles or wheezes. Heart:     Normal rate and regular rhythm. S1 and S2 normal without gallop, murmur, click, rub or other extra sounds. Abdomen:     Bowel sounds positive,abdomen soft and non-tender without masses, organomegaly or hernias noted. Msk:     tender left lower costal margin and left upper chest wall tender  Extremities:     no edema, no ulcers  Psych:     flat affect and moderately anxious.      Impression & Recommendations:  Problem # 1:  CHEST  PAIN (ICD-786.50) exam c/w prob msk strain involving the left chest due to increase lifting at work recently - tx wiht nsaid, and given note for off work this week Orders: T-2 View CXR, Same Day (71020.5TC)   Problem # 2:  ANXIETY (ICD-300.00)  Her updated medication list for this problem includes:    Fluoxetine Hcl 10 Mg Tabs (Fluoxetine hcl) .Marland Kitchen... Take once daily overall o/w stable it seems, cont meds as is  Complete Medication List: 1)  Fluoxetine Hcl 10 Mg Tabs (Fluoxetine hcl) .... Take once daily 2)  Protonix 40 Mg Tbec (Pantoprazole sodium) .... Take once daily 3)  Vitamin D3 1000 Unit Tabs (Cholecalciferol) .Marland Kitchen.. 1 by mouth daily 4)  Naproxen 500 Mg Tabs (Naproxen) .Marland Kitchen.. 1 by mouth two times a day as needed   Patient Instructions: 1)  Please take all new medications as prescribed 2)  Continue all medications that you may have been taking previously 3)  you are given the note off work for this week 4)  Please go to Radiology in the basement level for your X-Ray today  5)  Please schedule an appointment with your primary doctor as needed   Prescriptions: NAPROXEN 500 MG TABS (NAPROXEN) 1 by mouth two times a day as needed  #60 x 3   Entered and Authorized by:   Corwin Levins MD   Signed by:   Corwin Levins MD on 04/29/2008   Method used:   Print then Give to Patient   RxID:   [id number]  ]

## 2010-06-04 NOTE — Miscellaneous (Signed)
Summary: pantoprazole  Clinical Lists Changes  Medications: Rx of PROTONIX 40 MG  TBEC (PANTOPRAZOLE SODIUM) Take 1 once daily;  #30 x 5;  Signed;  Entered by: Tora Perches;  Authorized by: [name] MD;  Method used: Electronically to [facility]  [address], [place], [place]  [address], Ph: [phone] or [phone], Fax: [phone]    Prescriptions: PROTONIX 40 MG  TBEC (PANTOPRAZOLE SODIUM) Take 1 once daily  #30 x 5   Entered by:   Tora Perches   Authorized by:   [name] MD   Signed by:   Tora Perches on 12/04/2008   Method used:   Electronically to        [facility]  [address] (retail)       [address]       [place], [place]  [address]       Ph: [phone] or [phone]       Fax: [phone]   RxID:   [id number]

## 2010-09-18 NOTE — Discharge Summary (Signed)
[name], [name] NO.:  [id number]   MEDICAL RECORD NO.:  [id number]          PATIENT TYPE:  INP   LOCATION:  3031                         FACILITY:  [facility]   PHYSICIAN:  Georgina Quint. Plotnikov, MDDATE OF BIRTH:  [birth year]   DATE OF ADMISSION:  05/09/2006  DATE OF DISCHARGE:  05/11/2006                               DISCHARGE SUMMARY   HISTORY:  This is a 45 year old female who was admitted from the office  with a clinical picture of upper gastrointestinal bleeding.  For the  details, please address to my history and physical from May 09, 2006.   HOSPITAL COURSE:  During the course of hospitalization, she was treated  with proton pump inhibitors intravenously.  Her labs were checked.  GI  consultation with Dr. Marina Goodell was obtained.  She underwent EGD on May 10, 2006 and was found to have a bleeding esophageal erosion.  On the day  of discharge she was feeling well.  Her physical examination was stable.   FINDINGS OF PROCEDURE:  EGD May 10, 2006 revealed reflux esophagitis,  hiatal hernia, acute gastritis, GERD, esophageal stricture, bleeding  erosion noted.   LAB WORK:  Hemoglobin lowest is 9.6, admission 10.4, white cell count  8.5, potassium 3.3 the lowest, glucose 112-143.  Urinalysis normal.   DISCHARGE DIAGNOSES:  1. Upper gastrointestinal bleeding minor due to bleeding erosion,      antral erosions.  2. Esophagitis reflux.  3. Hiatal hernia.  4. Acute gastritis.   DISCHARGE MEDICATIONS:  Protonix 40 mg a day.   FOLLOWUP PLANS:  1. Follow up with Dr. Marina Goodell.  2. Follow up with Dr. Posey Rea.  3. Recheck blood sugar, hemoglobin, iron.      Georgina Quint. Plotnikov, MD  Electronically Signed     AVP/MEDQ  D:  07/14/2006  T:  07/15/2006  Job:  [id number]   cc:   Wilhemina Bonito. Marina Goodell, MD

## 2010-09-18 NOTE — H&P (Signed)
NAMEHEAVENLEE, MAIORANA NO.:  [id number]   MEDICAL RECORD NO.:  [id number]          PATIENT TYPE:  EMS   LOCATION:  MAJO                         FACILITY:  [facility]   PHYSICIAN:  Georgina Quint. Plotnikov, MDDATE OF BIRTH:  [birth year]   DATE OF ADMISSION:  05/09/2006  DATE OF DISCHARGE:                              HISTORY & PHYSICAL   CHIEF COMPLAINT:  Vomiting, abdominal pain.   HISTORY OF PRESENT ILLNESS:  The patient is a 45 year old female, who  presents with complaint a bad heartburn over the past several weeks,  much worse over the past week; in the past 2-3 days having a lot of pain  in the epigastrium.  Yesterday she was vomiting all night long with  coffee ground like emesis.  Also had some dark stools, weakness, and  dizziness.  She took a BC powder this morning.   PAST MEDICAL HISTORY:  Depressed mood, history of indigestion.   CURRENT MEDICINES:  1. Fluoxetine 10 mg daily.  2. Furosemide 20 mg p.r.n. swelling.  3. Klor-Con 20 mEq p.r.n.  4. BC powder for the variety of headaches and pain.  5. She took Pepto-Bismol this morning, only.   SOCIAL HISTORY:  Does not drink alcohol, nonsmoker.  She is married with  children.   FAMILY HISTORY:  Father with Parkinson's.  Mother with arthritis and  hypertension.   ALLERGIES:  NONE.   REVIEW OF SYSTEMS:  No weight loss.  No dysphagia.  The rest of the 10-  point system review is as above or negative.   PHYSICAL:  VITAL SIGNS:  Blood pressure 123/80, pulse 76, temperature  98.1, weight 221 pounds, blood pressure rechecked supine 130/85, upright  139/85.  GENERAL:  She appears tired, no acute distress, no pallor.  HEENT:  With moist mucosa.  NECK:  Supple, no meningeal signs.  LUNGS:  Clear, no wheezes or rales.  HEART:  With S1, S2, very slight tachycardia.  ABDOMEN:  Soft, sensitive in the epigastric area, no rebound symptoms,  no masses felt.  EXTREMITIES:  Low extremities with no edema.  NEUROLOGIC:   She is alert, cooperative, denies being depressed.  RECTAL EXAM:  Was not done.   ASSESSMENT AND PLAN:  1. Coffee ground emesis likely due to problem number 2.  We will admit      for 24-hour observation, check hemoglobin tonight and tomorrow.      Protonix IV 40 mg twice daily.  GI consultation called.  Possibly      EGD tomorrow.  Check serology for Helicobacter pylori.  2. Likely bleeding ulcer, could be NSAID use related; discontinue      Goody Powder.  Rectal exam was not done due to a Pepto-Bismol      intake earlier.  3. Gastroesophageal reflux disease.  Plan as above.  4. Aspirin use, will have to discontinue.  5. Mild depression, on therapy.      Georgina Quint. Plotnikov, MD  Electronically Signed     AVP/MEDQ  D:  05/09/2006  T:  05/10/2006  Job:  [id number]

## 2013-04-04 ENCOUNTER — Other Ambulatory Visit: Payer: Self-pay | Admitting: Internal Medicine

## 2013-04-04 MED ORDER — PANTOPRAZOLE SODIUM 40 MG PO TBEC: 40.0000 mg | DELAYED_RELEASE_TABLET | Freq: Every day | ORAL | Status: DC

## 2013-09-16 ENCOUNTER — Other Ambulatory Visit: Payer: Self-pay | Admitting: Internal Medicine

## 2016-05-31 ENCOUNTER — Telehealth: Payer: Self-pay | Admitting: Emergency Medicine

## 2016-05-31 NOTE — Telephone Encounter (Signed)
Pts daughter brought her grandfather Amanda [name] in for an appt today. She is wondering if you will still be willing to see her as a patient. She hasnt been seen since 2008. Please advise thanks.

## 2016-05-31 NOTE — Telephone Encounter (Signed)
Ok Thx 

## 2016-06-28 ENCOUNTER — Ambulatory Visit (INDEPENDENT_AMBULATORY_CARE_PROVIDER_SITE_OTHER)
Admission: RE | Admit: 2016-06-28 | Discharge: 2016-06-28 | Disposition: A | Discharge status: Home / Self Care | Payer: Managed Care, Other (non HMO) | Source: Ambulatory Visit | Attending: Internal Medicine | Admitting: Internal Medicine

## 2016-06-28 ENCOUNTER — Other Ambulatory Visit (INDEPENDENT_AMBULATORY_CARE_PROVIDER_SITE_OTHER): Payer: Managed Care, Other (non HMO)

## 2016-06-28 ENCOUNTER — Ambulatory Visit (INDEPENDENT_AMBULATORY_CARE_PROVIDER_SITE_OTHER): Payer: Managed Care, Other (non HMO) | Admitting: Internal Medicine

## 2016-06-28 ENCOUNTER — Encounter: Payer: Self-pay | Admitting: Internal Medicine

## 2016-06-28 VITALS — BP 108/88 | HR 78 | Temp 97.8°F | Resp 16 | Ht 62.0 in | Wt 215.5 lb

## 2016-06-28 DIAGNOSIS — F4320 Adjustment disorder, unspecified: Secondary | ICD-10-CM | POA: Diagnosis not present

## 2016-06-28 DIAGNOSIS — G5710 Meralgia paresthetica, unspecified lower limb: Secondary | ICD-10-CM

## 2016-06-28 DIAGNOSIS — M545 Low back pain, unspecified: Secondary | ICD-10-CM

## 2016-06-28 DIAGNOSIS — G8929 Other chronic pain: Secondary | ICD-10-CM

## 2016-06-28 DIAGNOSIS — J069 Acute upper respiratory infection, unspecified: Secondary | ICD-10-CM | POA: Diagnosis not present

## 2016-06-28 DIAGNOSIS — F329 Major depressive disorder, single episode, unspecified: Secondary | ICD-10-CM

## 2016-06-28 DIAGNOSIS — F4321 Adjustment disorder with depressed mood: Secondary | ICD-10-CM

## 2016-06-28 DIAGNOSIS — M549 Dorsalgia, unspecified: Secondary | ICD-10-CM | POA: Insufficient documentation

## 2016-06-28 LAB — HEPATIC FUNCTION PANEL
ALT: 15 U/L (ref 0–35)
AST: 18 U/L (ref 0–37)
Albumin: 4.5 g/dL (ref 3.5–5.2)
Alkaline Phosphatase: 64 U/L (ref 39–117)
Bilirubin, Direct: 0 mg/dL (ref 0.0–0.3)
Total Bilirubin: 0.3 mg/dL (ref 0.2–1.2)
Total Protein: 8.4 g/dL — ABNORMAL HIGH (ref 6.0–8.3)

## 2016-06-28 LAB — CBC WITH DIFFERENTIAL/PLATELET
Basophils Absolute: 0.2 10*3/uL — ABNORMAL HIGH (ref 0.0–0.1)
Basophils Relative: 3.1 % — ABNORMAL HIGH (ref 0.0–3.0)
Eosinophils Absolute: 0.3 10*3/uL (ref 0.0–0.7)
Eosinophils Relative: 4.1 % (ref 0.0–5.0)
HCT: 33.1 % — ABNORMAL LOW (ref 36.0–46.0)
Hemoglobin: 10.1 g/dL — ABNORMAL LOW (ref 12.0–15.0)
Lymphocytes Relative: 25.2 % (ref 12.0–46.0)
Lymphs Abs: 1.6 10*3/uL (ref 0.7–4.0)
MCHC: 30.7 g/dL (ref 30.0–36.0)
MCV: 63 fl — ABNORMAL LOW (ref 78.0–100.0)
Monocytes Absolute: 0.4 10*3/uL (ref 0.1–1.0)
Monocytes Relative: 6.5 % (ref 3.0–12.0)
Neutro Abs: 3.9 10*3/uL (ref 1.4–7.7)
Neutrophils Relative %: 61.1 % (ref 43.0–77.0)
Platelets: 421 10*3/uL — ABNORMAL HIGH (ref 150.0–400.0)
RBC: 5.25 Mil/uL — ABNORMAL HIGH (ref 3.87–5.11)
RDW: 17.2 % — ABNORMAL HIGH (ref 11.5–15.5)
WBC: 6.4 10*3/uL (ref 4.0–10.5)

## 2016-06-28 LAB — VITAMIN D 25 HYDROXY (VIT D DEFICIENCY, FRACTURES): VITD: 15.83 ng/mL — ABNORMAL LOW (ref 30.00–100.00)

## 2016-06-28 LAB — URINALYSIS
Bilirubin Urine: NEGATIVE
Hgb urine dipstick: NEGATIVE
Ketones, ur: NEGATIVE
Leukocytes, UA: NEGATIVE
Nitrite: NEGATIVE
Specific Gravity, Urine: 1.015 (ref 1.000–1.030)
Total Protein, Urine: NEGATIVE
Urine Glucose: NEGATIVE
Urobilinogen, UA: 0.2 (ref 0.0–1.0)
pH: 6 (ref 5.0–8.0)

## 2016-06-28 LAB — BASIC METABOLIC PANEL
BUN: 10 mg/dL (ref 6–23)
CO2: 26 mEq/L (ref 19–32)
Calcium: 9.5 mg/dL (ref 8.4–10.5)
Chloride: 100 mEq/L (ref 96–112)
Creatinine, Ser: 0.8 mg/dL (ref 0.40–1.20)
GFR: 80.99 mL/min (ref >60.00)
Glucose, Bld: 89 mg/dL (ref 70–99)
Potassium: 4 mEq/L (ref 3.5–5.1)
Sodium: 136 mEq/L (ref 135–145)

## 2016-06-28 LAB — TSH: TSH: 2.18 u[IU]/mL (ref 0.35–4.50)

## 2016-06-28 LAB — SEDIMENTATION RATE: Sed Rate: 73 mm/hr — ABNORMAL HIGH (ref 0–20)

## 2016-06-28 LAB — VITAMIN B12: Vitamin B-12: 289 pg/mL (ref 211–911)

## 2016-06-28 MED ORDER — NORTRIPTYLINE HCL 10 MG PO CAPS: 10.0000 mg | ORAL_CAPSULE | Freq: Every day | ORAL | 5 refills | Status: DC

## 2016-06-28 MED ORDER — NAPROXEN 500 MG PO TABS: 500.0000 mg | ORAL_TABLET | Freq: Two times a day (BID) | ORAL | 2 refills | Status: DC | PRN

## 2016-06-28 MED ORDER — VITAMIN D3 50 MCG (2000 UT) PO CAPS: 2000.0000 [IU] | ORAL_CAPSULE | Freq: Every day | ORAL | 3 refills | Status: DC

## 2016-06-28 MED ORDER — PROMETHAZINE-CODEINE 6.25-10 MG/5ML PO SYRP: 5.0000 mL | ORAL_SOLUTION | ORAL | 0 refills | Status: DC | PRN

## 2016-06-28 MED ORDER — AZITHROMYCIN 250 MG PO TABS: ORAL_TABLET | ORAL | 0 refills | Status: DC

## 2016-06-28 NOTE — Progress Notes (Signed)
Pre-visit discussion using our clinic review tool. No additional management support is needed unless otherwise documented below in the visit note.  

## 2016-06-28 NOTE — Assessment & Plan Note (Signed)
Son died in MVA 3 years ago; mom died in 2018

## 2016-06-28 NOTE — Assessment & Plan Note (Addendum)
Loose waist pants Labs

## 2016-06-28 NOTE — Progress Notes (Signed)
Subjective:  Patient ID: Amanda [name], female    DOB: August 08, 1965  Age: 51 y.o. MRN: [id number]  CC: Annual Exam (back pain, coughing, sore throat)   HPI Amanda [name] presents for new pt visit; Ibuprofen helps some C/o severe LBP x 2-3 years - worse Pain is worse w/standing C/o burning in the R lat thigh at times Grieving  Outpatient Medications Prior to Visit  Medication Sig Dispense Refill  . pantoprazole (PROTONIX) 40 MG tablet TAKE ONE TABLET BY MOUTH ONE TIME DAILY  30 tablet 2   No facility-administered medications prior to visit.     ROS Review of Systems  Constitutional: Negative for activity change, appetite change, chills, fatigue and unexpected weight change.  HENT: Negative for congestion, mouth sores and sinus pressure.   Eyes: Negative for visual disturbance.  Respiratory: Negative for cough and chest tightness.   Gastrointestinal: Negative for abdominal pain and nausea.  Genitourinary: Negative for difficulty urinating, frequency and vaginal pain.  Musculoskeletal: Positive for gait problem. Negative for back pain.  Skin: Negative for pallor and rash.  Neurological: Negative for dizziness, tremors, weakness, numbness and headaches.  Psychiatric/Behavioral: Negative for confusion and sleep disturbance. The patient is nervous/anxious.     Objective:  BP 108/88   Pulse 78   Temp 97.8 F (36.6 C) (Oral)   Resp 16   Ht 5\' 2"  (1.575 m)   Wt 215 lb 8 oz (97.8 kg)   SpO2 98%   BMI 39.42 kg/m   BP Readings from Last 3 Encounters:  06/28/16 108/88  01/20/09 124/84  05/08/08 116/84    Wt Readings from Last 3 Encounters:  06/28/16 215 lb 8 oz (97.8 kg)  01/20/09 187 lb (84.8 kg)  05/08/08 205 lb (93 kg)    Physical Exam  Constitutional: She appears well-developed. No distress.  HENT:  Head: Normocephalic.  Right Ear: External ear normal.  Left Ear: External ear normal.  Nose: Nose normal.  Mouth/Throat: Oropharynx is clear and moist.  Eyes:  Conjunctivae are normal. Pupils are equal, round, and reactive to light. Right eye exhibits no discharge. Left eye exhibits no discharge.  Neck: Normal range of motion. Neck supple. No JVD present. No tracheal deviation present. No thyromegaly present.  Cardiovascular: Normal rate, regular rhythm and normal heart sounds.   Pulmonary/Chest: No stridor. No respiratory distress. She has no wheezes.  Abdominal: Soft. Bowel sounds are normal. She exhibits no distension and no mass. There is no tenderness. There is no rebound and no guarding.  Musculoskeletal: She exhibits tenderness. She exhibits no edema.  Lymphadenopathy:    She has no cervical adenopathy.  Neurological: She displays normal reflexes. No cranial nerve deficit. She exhibits normal muscle tone. Coordination normal.  Skin: No rash noted. No erythema.  Psychiatric: She has a normal mood and affect. Her behavior is normal. Judgment and thought content normal.  Obese  Lab Results  Component Value Date   WBC 8.1 01/20/2009   HGB 10.1 (L) 01/20/2009   HCT 31.8 (L) 01/20/2009   PLT 351.0 01/20/2009   GLUCOSE 80 01/20/2009   ALT 14 01/20/2009   AST 19 01/20/2009   NA 141 01/20/2009   K 3.8 01/20/2009   CL 105 01/20/2009   CREATININE 0.8 01/20/2009   BUN 10 01/20/2009   CO2 28 01/20/2009   TSH 3.19 01/20/2009    No results found.  Assessment & Plan:   There are no diagnoses linked to this encounter. I have discontinued Ms. Dzuima's pantoprazole. I am  also having her maintain her naproxen sodium and cetirizine.  Meds ordered this encounter  Medications  . naproxen sodium (ANAPROX) 220 MG tablet    Sig: Take 220 mg by mouth 2 (two) times daily with a meal.  . cetirizine (ZYRTEC) 10 MG tablet    Sig: Take 10 mg by mouth daily.     Follow-up: No Follow-up on file.  Sonda PrimesAlex Alydia Gosser, MD

## 2016-06-28 NOTE — Assessment & Plan Note (Addendum)
[facility] McKenzie lumbar roll Exercise Labs X ray

## 2016-06-28 NOTE — Patient Instructions (Addendum)
Get a McKenzie lumbar roll Exercise Back Exercises Introduction If you have pain in your back, do these exercises 2-3 times each day or as told by your doctor. When the pain goes away, do the exercises once each day, but repeat the steps more times for each exercise (do more repetitions). If you do not have pain in your back, do these exercises once each day or as told by your doctor. Exercises Single Knee to Chest  Do these steps 3-5 times in a row for each leg: 1. Lie on your back on a firm bed or the floor with your legs stretched out. 2. Bring one knee to your chest. 3. Hold your knee to your chest by grabbing your knee or thigh. 4. Pull on your knee until you feel a gentle stretch in your lower back. 5. Keep doing the stretch for 10-30 seconds. 6. Slowly let go of your leg and straighten it. Pelvic Tilt  Do these steps 5-10 times in a row: 1. Lie on your back on a firm bed or the floor with your legs stretched out. 2. Bend your knees so they point up to the ceiling. Your feet should be flat on the floor. 3. Tighten your lower belly (abdomen) muscles to press your lower back against the floor. This will make your tailbone point up to the ceiling instead of pointing down to your feet or the floor. 4. Stay in this position for 5-10 seconds while you gently tighten your muscles and breathe evenly. Cat-Cow  Do these steps until your lower back bends more easily: 1. Get on your hands and knees on a firm surface. Keep your hands under your shoulders, and keep your knees under your hips. You may put padding under your knees. 2. Let your head hang down, and make your tailbone point down to the floor so your lower back is round like the back of a cat. 3. Stay in this position for 5 seconds. 4. Slowly lift your head and make your tailbone point up to the ceiling so your back hangs low (sags) like the back of a cow. 5. Stay in this position for 5 seconds. Press-Ups  Do these steps 5-10 times in  a row: 1. Lie on your belly (face-down) on the floor. 2. Place your hands near your head, about shoulder-width apart. 3. While you keep your back relaxed and keep your hips on the floor, slowly straighten your arms to raise the top half of your body and lift your shoulders. Do not use your back muscles. To make yourself more comfortable, you may change where you place your hands. 4. Stay in this position for 5 seconds. 5. Slowly return to lying flat on the floor. Bridges  Do these steps 10 times in a row: 1. Lie on your back on a firm surface. 2. Bend your knees so they point up to the ceiling. Your feet should be flat on the floor. 3. Tighten your butt muscles and lift your butt off of the floor until your waist is almost as high as your knees. If you do not feel the muscles working in your butt and the back of your thighs, slide your feet 1-2 inches farther away from your butt. 4. Stay in this position for 3-5 seconds. 5. Slowly lower your butt to the floor, and let your butt muscles relax. If this exercise is too easy, try doing it with your arms crossed over your chest. Belly Crunches  Do these steps 5-10  times in a row: 1. Lie on your back on a firm bed or the floor with your legs stretched out. 2. Bend your knees so they point up to the ceiling. Your feet should be flat on the floor. 3. Cross your arms over your chest. 4. Tip your chin a little bit toward your chest but do not bend your neck. 5. Tighten your belly muscles and slowly raise your chest just enough to lift your shoulder blades a tiny bit off of the floor. 6. Slowly lower your chest and your head to the floor. Back Lifts  Do these steps 5-10 times in a row: 1. Lie on your belly (face-down) with your arms at your sides, and rest your forehead on the floor. 2. Tighten the muscles in your legs and your butt. 3. Slowly lift your chest off of the floor while you keep your hips on the floor. Keep the back of your head in line  with the curve in your back. Look at the floor while you do this. 4. Stay in this position for 3-5 seconds. 5. Slowly lower your chest and your face to the floor. Contact a doctor if:  Your back pain gets a lot worse when you do an exercise.  Your back pain does not lessen 2 hours after you exercise. If you have any of these problems, stop doing the exercises. Do not do them again unless your doctor says it is okay. Get help right away if:  You have sudden, very bad back pain. If this happens, stop doing the exercises. Do not do them again unless your doctor says it is okay. This information is not intended to replace advice given to you by your health care provider. Make sure you discuss any questions you have with your health care provider. Document Released: 05/22/2010 Document Revised: 09/25/2015 Document Reviewed: 06/13/2014  2017 Elsevier   Use over-the-counter  "cold" medicines  such as "Tylenol cold" , "Advil cold",  "Mucinex" or" Mucinex D"  for cough and congestion.   Avoid decongestants if you have high blood pressure and use "Afrin" nasal spray for nasal congestion as directed instead. Use " Delsym" or" Robitussin" cough syrup varietis for cough.  You can use plain "Tylenol" or "Advil" for fever, chills and achyness. Use Halls or Ricola cough drops.  On average, a" viral cold" illness would take 4-7 days to resolve. Please, make an appointment if you are not better or if you're worse.

## 2016-06-28 NOTE — Assessment & Plan Note (Signed)
Grieving - mom ust died Son died 3 years ago in a MVA Discussed Pamelor Rx

## 2016-06-28 NOTE — Assessment & Plan Note (Signed)
As directed Zpac

## 2016-06-29 ENCOUNTER — Other Ambulatory Visit: Payer: Self-pay | Admitting: Internal Medicine

## 2016-06-29 MED ORDER — VITAMIN D3 1.25 MG (50000 UT) PO CAPS: 1.0000 | ORAL_CAPSULE | ORAL | 0 refills | Status: DC

## 2016-06-29 MED ORDER — FERROUS SULFATE 325 (65 FE) MG PO TABS: 325.0000 mg | ORAL_TABLET | Freq: Every day | ORAL | 6 refills | Status: DC

## 2016-08-09 ENCOUNTER — Encounter: Payer: Self-pay | Admitting: Internal Medicine

## 2016-08-09 ENCOUNTER — Ambulatory Visit (INDEPENDENT_AMBULATORY_CARE_PROVIDER_SITE_OTHER): Payer: Managed Care, Other (non HMO) | Admitting: Internal Medicine

## 2016-08-09 DIAGNOSIS — M797 Fibromyalgia: Secondary | ICD-10-CM | POA: Diagnosis not present

## 2016-08-09 DIAGNOSIS — E538 Deficiency of other specified B group vitamins: Secondary | ICD-10-CM | POA: Diagnosis not present

## 2016-08-09 DIAGNOSIS — F411 Generalized anxiety disorder: Secondary | ICD-10-CM | POA: Diagnosis not present

## 2016-08-09 DIAGNOSIS — E559 Vitamin D deficiency, unspecified: Secondary | ICD-10-CM | POA: Diagnosis not present

## 2016-08-09 MED ORDER — B COMPLEX PO TABS: 1.0000 | ORAL_TABLET | Freq: Every day | ORAL | 3 refills | Status: AC

## 2016-08-09 MED ORDER — CYCLOBENZAPRINE HCL 10 MG PO TABS: 10.0000 mg | ORAL_TABLET | Freq: Every day | ORAL | 5 refills | Status: DC

## 2016-08-09 MED ORDER — CYANOCOBALAMIN 1000 MCG/ML IJ SOLN
1000.0000 ug | Freq: Once | INTRAMUSCULAR | Status: AC
  Administered 2016-08-09: 1000 ug via INTRAMUSCULAR

## 2016-08-09 MED ORDER — DULOXETINE HCL 30 MG PO CPEP
30.0000 mg | ORAL_CAPSULE | Freq: Every day | ORAL | 5 refills | Status: DC
Start: 2016-08-09 — End: 2017-10-11

## 2016-08-09 NOTE — Assessment & Plan Note (Signed)
vitamin d - see Rx

## 2016-08-09 NOTE — Assessment & Plan Note (Signed)
Cymbalta Flexeril

## 2016-08-09 NOTE — Assessment & Plan Note (Signed)
Start Cymbalta 

## 2016-08-09 NOTE — Progress Notes (Signed)
Pre visit review using our clinic review tool, if applicable. No additional management support is needed unless otherwise documented below in the visit note. 

## 2016-08-09 NOTE — Assessment & Plan Note (Signed)
Start B12/B complex

## 2016-08-09 NOTE — Progress Notes (Signed)
Subjective:  Patient ID: Amanda [name], female    DOB: December 01, 1965  Age: 51 y.o. MRN: [id number]  CC: No chief complaint on file.   HPI [name] presents for LBP, body aches - not better F/u anemia - on iron, Vit D def  Outpatient Medications Prior to Visit  Medication Sig Dispense Refill  . azithromycin (ZITHROMAX) 250 MG tablet As directed 6 tablet 0  . cetirizine (ZYRTEC) 10 MG tablet Take 10 mg by mouth daily.    . Cholecalciferol (VITAMIN D3) 2000 units capsule Take 1 capsule (2,000 Units total) by mouth daily. 100 capsule 3  . Cholecalciferol (VITAMIN D3) 50000 units CAPS Take 1 capsule by mouth once a week. 6 capsule 0  . ferrous sulfate 325 (65 FE) MG tablet Take 1 tablet (325 mg total) by mouth daily with breakfast. 30 tablet 6  . naproxen (NAPROSYN) 500 MG tablet Take 1 tablet (500 mg total) by mouth 2 (two) times daily as needed. 60 tablet 2  . naproxen sodium (ANAPROX) 220 MG tablet Take 220 mg by mouth 2 (two) times daily with a meal.    . nortriptyline (PAMELOR) 10 MG capsule Take 1 capsule (10 mg total) by mouth at bedtime. 30 capsule 5  . promethazine-codeine (PHENERGAN WITH CODEINE) 6.25-10 MG/5ML syrup Take 5 mLs by mouth every 4 (four) hours as needed. 300 mL 0   No facility-administered medications prior to visit.     ROS Review of Systems  Constitutional: Negative for activity change, appetite change, chills, fatigue and unexpected weight change.  HENT: Negative for congestion, mouth sores and sinus pressure.   Eyes: Negative for visual disturbance.  Respiratory: Negative for cough and chest tightness.   Gastrointestinal: Negative for abdominal pain and nausea.  Genitourinary: Negative for difficulty urinating, frequency and vaginal pain.  Musculoskeletal: Positive for arthralgias and back pain. Negative for gait problem.  Skin: Negative for pallor and rash.  Neurological: Negative for dizziness, tremors, weakness, numbness and headaches.    Psychiatric/Behavioral: Negative for confusion and sleep disturbance.    Objective:  BP 124/72 (BP Location: Right Arm, Patient Position: Sitting, Cuff Size: Large)   Pulse (!) 103   Temp 98 F (36.7 C) (Oral)   Ht  (1.575 m)   Wt 215 lb 1.3 oz (97.6 kg)   SpO2 99%   BMI 39.34 kg/m   BP Readings from Last 3 Encounters:  08/09/16 124/72  06/28/16 108/88  01/20/09 124/84    Wt Readings from Last 3 Encounters:  08/09/16 215 lb 1.3 oz (97.6 kg)  06/28/16 215 lb 8 oz (97.8 kg)  01/20/09 187 lb (84.8 kg)    Physical Exam  Constitutional: She appears well-developed. No distress.  HENT:  Head: Normocephalic.  Right Ear: External ear normal.  Left Ear: External ear normal.  Nose: Nose normal.  Mouth/Throat: Oropharynx is clear and moist.  Eyes: Conjunctivae are normal. Pupils are equal, round, and reactive to light. Right eye exhibits no discharge. Left eye exhibits no discharge.  Neck: Normal range of motion. Neck supple. No JVD present. No tracheal deviation present. No thyromegaly present.  Cardiovascular: Normal rate, regular rhythm and normal heart sounds.   Pulmonary/Chest: No stridor. No respiratory distress. She has no wheezes.  Abdominal: Soft. Bowel sounds are normal. She exhibits no distension and no mass. There is no tenderness. There is no rebound and no guarding.  Musculoskeletal: She exhibits tenderness. She exhibits no edema.  Lymphadenopathy:    She has no cervical adenopathy.  Neurological: She displays normal reflexes. No cranial nerve deficit. She exhibits normal muscle tone. Coordination normal.  Skin: No rash noted. No erythema.  Psychiatric: She has a normal mood and affect. Her behavior is normal. Judgment and thought content normal.    Lab Results  Component Value Date   WBC 6.4 06/28/2016   HGB 10.1 (L) 06/28/2016   HCT 33.1 (L) 06/28/2016   PLT 421.0 (H) 06/28/2016   GLUCOSE 89 06/28/2016   ALT 15 06/28/2016   AST 18 06/28/2016   NA 136  06/28/2016   K 4.0 06/28/2016   CL 100 06/28/2016   CREATININE 0.80 06/28/2016   BUN 10 06/28/2016   CO2 26 06/28/2016   TSH 2.18 06/28/2016    Dg Chest 2 View  Result Date: 06/28/2016 CLINICAL DATA:  Cough and congestion. EXAM: CHEST  2 VIEW COMPARISON:  04/29/2008. FINDINGS: Mediastinum and hilar structures normal. Mild infiltrate anteriorly on the lateral view cannot be excluded. Follow-up PA lateral chest x-ray suggested to demonstrate resolution . Heart size normal. No pleural effusion or pneumothorax. Sliding hiatal hernia. IMPRESSION: 1. Mild anterior pulmonary infiltrate seen on lateral view noted. Follow-up PA lateral chest x-ray suggested to demonstrate resolution. 2. Sliding hiatal hernia. Electronically Signed   By: Maisie Fus  Register   On: 06/28/2016 15:51   Dg Lumbar Spine 2-3 Views  Result Date: 06/28/2016 CLINICAL DATA:  Chronic back pain. EXAM: LUMBAR SPINE - 2-3 VIEW COMPARISON:  11/24/2005 . FINDINGS: Mild scoliosis. Mild degenerative change. No acute bony abnormality identified. Normal alignment in mineralization. Pelvic calcifications noted consistent phleboliths. Stool noted throughout the colon . IMPRESSION: Mild scoliosis concave right with diffuse mild degenerative change. No acute bony abnormality. Electronically Signed   By: Maisie Fus  Register   On: 06/28/2016 15:51    Assessment & Plan:   There are no diagnoses linked to this encounter. I am having Ms. Beckom maintain her naproxen sodium, cetirizine, azithromycin, naproxen, Vitamin D3, nortriptyline, promethazine-codeine, ferrous sulfate, and Vitamin D3.  No orders of the defined types were placed in this encounter.    Follow-up: No Follow-up on file.  Sonda Primes, MD

## 2016-08-12 ENCOUNTER — Telehealth: Payer: Self-pay

## 2016-08-12 NOTE — Telephone Encounter (Signed)
Rec'd fax from [facility]. They only have a B Complex with Vitamin C, is this okay? Please advise.  [facility] [address] [place].

## 2016-08-13 ENCOUNTER — Other Ambulatory Visit: Payer: Self-pay

## 2016-08-13 MED ORDER — B COMPLEX-C PO TABS: 1.0000 | ORAL_TABLET | Freq: Every day | ORAL | 3 refills | Status: DC

## 2016-08-13 NOTE — Telephone Encounter (Signed)
Ok Thx 

## 2016-08-13 NOTE — Telephone Encounter (Signed)
Pharmacy notified, new RX sent

## 2016-09-20 ENCOUNTER — Ambulatory Visit: Payer: Managed Care, Other (non HMO) | Admitting: Internal Medicine

## 2016-10-04 ENCOUNTER — Ambulatory Visit: Payer: Managed Care, Other (non HMO) | Admitting: Internal Medicine

## 2016-11-12 ENCOUNTER — Telehealth: Payer: Self-pay | Admitting: Internal Medicine

## 2016-11-12 MED ORDER — FERROUS SULFATE 325 (65 FE) MG PO TABS: 325.0000 mg | ORAL_TABLET | Freq: Every day | ORAL | 6 refills | Status: DC

## 2016-11-12 NOTE — Telephone Encounter (Signed)
RX sent

## 2016-11-12 NOTE — Telephone Encounter (Signed)
Pt needs a refill of ferrous sulfate 325 (65 FE) MG tablet

## 2016-11-22 ENCOUNTER — Other Ambulatory Visit: Payer: Self-pay | Admitting: Internal Medicine

## 2016-11-23 NOTE — Telephone Encounter (Signed)
Pls advise if ok to refill../lmb 

## 2016-12-31 ENCOUNTER — Other Ambulatory Visit: Payer: Self-pay | Admitting: Internal Medicine

## 2017-01-10 ENCOUNTER — Other Ambulatory Visit: Payer: Self-pay | Admitting: Internal Medicine

## 2017-01-12 ENCOUNTER — Other Ambulatory Visit: Payer: Self-pay | Admitting: Internal Medicine

## 2017-01-12 NOTE — Telephone Encounter (Signed)
Routing to dr plotnikov, please advise, thanks 

## 2017-03-16 ENCOUNTER — Other Ambulatory Visit: Payer: Self-pay | Admitting: Internal Medicine

## 2017-03-31 ENCOUNTER — Telehealth: Payer: Self-pay | Admitting: Internal Medicine

## 2017-03-31 NOTE — Telephone Encounter (Signed)
Called patient.  States she has to work tomorrow but is feeling better so she is cancelling her appt.

## 2017-03-31 NOTE — Telephone Encounter (Signed)
Copied from CRM [id number]. Topic: Quick Communication - See Telephone Encounter >> Mar 31, 2017 12:39 PM Rudi CocoLathan, Samah Lapiana M, NT wrote: CRM for notification. See Telephone encounter for:   03/31/17. Pt. Running a low grade fever of 100.0 tried to schedule pt. For appt. Tomorrow 11/30 with Dr. Lawerance BachBurns but pt. Denied and said she will wait until Dr. Posey ReaPlotnikov has something open. Please give pt. A call if able to schedule something sooner than 12/24 [phone] CRM for notification. See Telephone encounter for: 03/31/17.

## 2017-03-31 NOTE — Telephone Encounter (Signed)
Provider is at session limit for tomorrow.  Can we schedule?

## 2017-03-31 NOTE — Telephone Encounter (Signed)
noted 

## 2017-03-31 NOTE — Telephone Encounter (Signed)
3:45 tomorrow

## 2017-04-19 ENCOUNTER — Other Ambulatory Visit: Payer: Self-pay | Admitting: Internal Medicine

## 2017-04-21 ENCOUNTER — Other Ambulatory Visit: Payer: Self-pay

## 2017-04-21 MED ORDER — FERROUS SULFATE 325 (65 FE) MG PO TABS: 325.0000 mg | ORAL_TABLET | Freq: Every day | ORAL | 1 refills | Status: DC

## 2017-04-25 ENCOUNTER — Ambulatory Visit: Payer: Managed Care, Other (non HMO) | Admitting: Internal Medicine

## 2017-05-03 DIAGNOSIS — D649 Anemia, unspecified: Secondary | ICD-10-CM

## 2017-05-03 HISTORY — DX: Anemia, unspecified: D64.9

## 2017-10-11 ENCOUNTER — Encounter: Payer: Self-pay | Admitting: Internal Medicine

## 2017-10-11 ENCOUNTER — Other Ambulatory Visit (INDEPENDENT_AMBULATORY_CARE_PROVIDER_SITE_OTHER): Payer: BLUE CROSS/BLUE SHIELD

## 2017-10-11 ENCOUNTER — Ambulatory Visit: Payer: BLUE CROSS/BLUE SHIELD | Admitting: Internal Medicine

## 2017-10-11 ENCOUNTER — Ambulatory Visit (INDEPENDENT_AMBULATORY_CARE_PROVIDER_SITE_OTHER)
Admission: RE | Admit: 2017-10-11 | Discharge: 2017-10-11 | Disposition: A | Discharge status: Home / Self Care | Payer: BLUE CROSS/BLUE SHIELD | Source: Ambulatory Visit | Attending: Internal Medicine | Admitting: Internal Medicine

## 2017-10-11 VITALS — BP 122/74 | HR 93 | Temp 97.9°F | Ht 62.0 in | Wt 211.0 lb

## 2017-10-11 DIAGNOSIS — F329 Major depressive disorder, single episode, unspecified: Secondary | ICD-10-CM

## 2017-10-11 DIAGNOSIS — R079 Chest pain, unspecified: Secondary | ICD-10-CM

## 2017-10-11 DIAGNOSIS — D509 Iron deficiency anemia, unspecified: Secondary | ICD-10-CM | POA: Diagnosis not present

## 2017-10-11 DIAGNOSIS — F411 Generalized anxiety disorder: Secondary | ICD-10-CM | POA: Diagnosis not present

## 2017-10-11 DIAGNOSIS — E538 Deficiency of other specified B group vitamins: Secondary | ICD-10-CM

## 2017-10-11 LAB — CBC WITH DIFFERENTIAL/PLATELET
Basophils Absolute: 0.1 10*3/uL (ref 0.0–0.1)
Basophils Relative: 2 % (ref 0.0–3.0)
Eosinophils Absolute: 0.2 10*3/uL (ref 0.0–0.7)
Eosinophils Relative: 4 % (ref 0.0–5.0)
HCT: 19.7 % — CL (ref 36.0–46.0)
Hemoglobin: 5.7 g/dL — CL (ref 12.0–15.0)
Lymphocytes Relative: 24.6 % (ref 12.0–46.0)
Lymphs Abs: 1.1 10*3/uL (ref 0.7–4.0)
MCHC: 28.8 g/dL — ABNORMAL LOW (ref 30.0–36.0)
MCV: 54.9 fl — ABNORMAL LOW (ref 78.0–100.0)
Monocytes Absolute: 0.4 10*3/uL (ref 0.1–1.0)
Monocytes Relative: 8.4 % (ref 3.0–12.0)
Neutro Abs: 2.7 10*3/uL (ref 1.4–7.7)
Neutrophils Relative %: 61 % (ref 43.0–77.0)
Platelets: 269 10*3/uL (ref 150.0–400.0)
RBC: 3.59 Mil/uL — ABNORMAL LOW (ref 3.87–5.11)
RDW: 20.7 % — ABNORMAL HIGH (ref 11.5–15.5)
WBC: 4.4 10*3/uL (ref 4.0–10.5)

## 2017-10-11 LAB — BASIC METABOLIC PANEL
BUN: 12 mg/dL (ref 6–23)
CO2: 27 mEq/L (ref 19–32)
Calcium: 9.2 mg/dL (ref 8.4–10.5)
Chloride: 104 mEq/L (ref 96–112)
Creatinine, Ser: 0.84 mg/dL (ref 0.40–1.20)
GFR: 76.15 mL/min (ref >60.00)
Glucose, Bld: 103 mg/dL — ABNORMAL HIGH (ref 70–99)
Potassium: 3.8 mEq/L (ref 3.5–5.1)
Sodium: 140 mEq/L (ref 135–145)

## 2017-10-11 LAB — URINALYSIS
Bilirubin Urine: NEGATIVE
Hgb urine dipstick: NEGATIVE
Ketones, ur: NEGATIVE
Leukocytes, UA: NEGATIVE
Nitrite: NEGATIVE
Specific Gravity, Urine: 1.015 (ref 1.000–1.030)
Total Protein, Urine: NEGATIVE
Urine Glucose: NEGATIVE
Urobilinogen, UA: 0.2 (ref 0.0–1.0)
pH: 7.5 (ref 5.0–8.0)

## 2017-10-11 LAB — HEPATIC FUNCTION PANEL
ALT: 11 U/L (ref 0–35)
AST: 12 U/L (ref 0–37)
Albumin: 4.2 g/dL (ref 3.5–5.2)
Alkaline Phosphatase: 60 U/L (ref 39–117)
Bilirubin, Direct: 0.1 mg/dL (ref 0.0–0.3)
Total Bilirubin: 0.4 mg/dL (ref 0.2–1.2)
Total Protein: 7.5 g/dL (ref 6.0–8.3)

## 2017-10-11 LAB — TSH: TSH: 1.64 u[IU]/mL (ref 0.35–4.50)

## 2017-10-11 LAB — H. PYLORI ANTIBODY, IGG: H Pylori IgG: NEGATIVE

## 2017-10-11 LAB — T4, FREE: Free T4: 0.89 ng/dL (ref 0.60–1.60)

## 2017-10-11 LAB — VITAMIN B12: Vitamin B-12: 222 pg/mL (ref 211–911)

## 2017-10-11 MED ORDER — VITAMIN B-12 1000 MCG SL SUBL: 1.0000 | SUBLINGUAL_TABLET | Freq: Every day | SUBLINGUAL | 3 refills | Status: DC

## 2017-10-11 MED ORDER — ZOLPIDEM TARTRATE 5 MG PO TABS: 2.5000 mg | ORAL_TABLET | Freq: Every evening | ORAL | 1 refills | Status: DC | PRN

## 2017-10-11 NOTE — Assessment & Plan Note (Signed)
Not taking meds The stress is worse

## 2017-10-11 NOTE — Assessment & Plan Note (Signed)
Labs

## 2017-10-11 NOTE — Assessment & Plan Note (Addendum)
Labs On B12 - take SL

## 2017-10-11 NOTE — Progress Notes (Signed)
Subjective:  Patient ID: Amanda [name], female    DOB: [birth year]  Age: 52 y.o. MRN: [id number]  CC: No chief complaint on file.   HPI Amanda [name] presents for a severe stress - tearful. C/o stress at work - just quit 18 h/d. C/o insomnia C/o severe weakness C/o CP with bending over x 3 -4 mo Stress w/dtrs's divorce   Outpatient Medications Prior to Visit  Medication Sig Dispense Refill  . b complex vitamins tablet Take 1 tablet by mouth daily. 100 tablet 3  . cetirizine (ZYRTEC) 10 MG tablet Take 10 mg by mouth daily.    . DULoxetine (CYMBALTA) 30 MG capsule Take 1 capsule (30 mg total) by mouth daily. 30 capsule 5  . ferrous sulfate 325 (65 FE) MG tablet Take 1 tablet (325 mg total) by mouth daily with breakfast. 90 tablet 1  . naproxen (NAPROSYN) 500 MG tablet TAKE 1 TABLET (500 MG TOTAL) BY MOUTH 2 (TWO) TIMES DAILY AS NEEDED. 60 tablet 2  . SUPER B COMPLEX & C TABS TAKE 1 TABLET BY MOUTH DAILY. 100 tablet 3  . Cholecalciferol (VITAMIN D3) 2000 units capsule Take 1 capsule (2,000 Units total) by mouth daily. 100 capsule 3  . Cholecalciferol (VITAMIN D3) 50000 units CAPS TAKE 1 CAPSULE BY MOUTH ONCE A WEEK (Patient not taking: Reported on 10/11/2017) 6 capsule 0  . cyclobenzaprine (FLEXERIL) 10 MG tablet Take 1 tablet (10 mg total) by mouth at bedtime. (Patient not taking: Reported on 10/11/2017) 30 tablet 5   No facility-administered medications prior to visit.     ROS: Review of Systems  Constitutional: Positive for fatigue. Negative for activity change, appetite change, chills and unexpected weight change.  HENT: Negative for congestion, mouth sores and sinus pressure.   Eyes: Negative for visual disturbance.  Respiratory: Negative for cough and chest tightness.   Cardiovascular: Positive for chest pain.  Gastrointestinal: Negative for abdominal pain and nausea.  Genitourinary: Negative for difficulty urinating, frequency and vaginal pain.  Musculoskeletal: Negative for back  pain and gait problem.  Skin: Positive for pallor. Negative for rash.  Neurological: Positive for dizziness and weakness. Negative for tremors, numbness and headaches.  Psychiatric/Behavioral: Positive for dysphoric mood. Negative for confusion, self-injury, sleep disturbance and suicidal ideas. The patient is nervous/anxious.     Objective:  BP 122/74 (BP Location: Left Arm, Patient Position: Sitting, Cuff Size: Large)   Pulse 93   Temp 97.9 F (36.6 C) (Oral)   Ht 5\' 2"  (1.575 m)   Wt 211 lb (95.7 kg)   SpO2 100%   BMI 38.59 kg/m   BP Readings from Last 3 Encounters:  10/11/17 122/74  08/09/16 124/72  06/28/16 108/88    Wt Readings from Last 3 Encounters:  10/11/17 211 lb (95.7 kg)  08/09/16 215 lb 1.3 oz (97.6 kg)  06/28/16 215 lb 8 oz (97.8 kg)    Physical Exam  Constitutional: She appears well-developed. No distress.  HENT:  Head: Normocephalic.  Right Ear: External ear normal.  Left Ear: External ear normal.  Nose: Nose normal.  Mouth/Throat: Oropharynx is clear and moist.  Eyes: Pupils are equal, round, and reactive to light. Conjunctivae are normal. Right eye exhibits no discharge. Left eye exhibits no discharge.  Neck: Normal range of motion. Neck supple. No JVD present. No tracheal deviation present. No thyromegaly present.  Cardiovascular: Normal rate, regular rhythm and normal heart sounds.  Pulmonary/Chest: No stridor. No respiratory distress. She has no wheezes.  Abdominal: Soft. Bowel sounds  are normal. She exhibits no distension and no mass. There is no tenderness. There is no rebound and no guarding.  Musculoskeletal: She exhibits no edema or tenderness.  Lymphadenopathy:    She has no cervical adenopathy.  Neurological: She displays normal reflexes. No cranial nerve deficit. She exhibits normal muscle tone. Coordination normal.  Skin: No rash noted. No erythema.  Psychiatric: She has a normal mood and affect. Her behavior is normal. Judgment and  thought content normal.  pale Sad Chest NT  Procedure: EKG Indication: chest pain Impression: NSR. No acute changes.   Lab Results  Component Value Date   WBC 6.4 06/28/2016   HGB 10.1 (L) 06/28/2016   HCT 33.1 (L) 06/28/2016   PLT 421.0 (H) 06/28/2016   GLUCOSE 89 06/28/2016   ALT 15 06/28/2016   AST 18 06/28/2016   NA 136 06/28/2016   K 4.0 06/28/2016   CL 100 06/28/2016   CREATININE 0.80 06/28/2016   BUN 10 06/28/2016   CO2 26 06/28/2016   TSH 2.18 06/28/2016    Dg Chest 2 View  Result Date: 06/28/2016 CLINICAL DATA:  Cough and congestion. EXAM: CHEST  2 VIEW COMPARISON:  04/29/2008. FINDINGS: Mediastinum and hilar structures normal. Mild infiltrate anteriorly on the lateral view cannot be excluded. Follow-up PA lateral chest x-ray suggested to demonstrate resolution . Heart size normal. No pleural effusion or pneumothorax. Sliding hiatal hernia. IMPRESSION: 1. Mild anterior pulmonary infiltrate seen on lateral view noted. Follow-up PA lateral chest x-ray suggested to demonstrate resolution. 2. Sliding hiatal hernia. Electronically Signed   By: Maisie Fushomas  Register   On: 06/28/2016 15:51   Dg Lumbar Spine 2-3 Views  Result Date: 06/28/2016 CLINICAL DATA:  Chronic back pain. EXAM: LUMBAR SPINE - 2-3 VIEW COMPARISON:  11/24/2005 . FINDINGS: Mild scoliosis. Mild degenerative change. No acute bony abnormality identified. Normal alignment in mineralization. Pelvic calcifications noted consistent phleboliths. Stool noted throughout the colon . IMPRESSION: Mild scoliosis concave right with diffuse mild degenerative change. No acute bony abnormality. Electronically Signed   By: Maisie Fushomas  Register   On: 06/28/2016 15:51    Assessment & Plan:   There are no diagnoses linked to this encounter.   No orders of the defined types were placed in this encounter.    Follow-up: No follow-ups on file.  Sonda PrimesAlex Codie Krogh, MD

## 2017-10-11 NOTE — Assessment & Plan Note (Addendum)
Labs    severe chronic anemia with Hgb 5.7  Hem and GI ref

## 2017-10-11 NOTE — Assessment & Plan Note (Addendum)
Not taking meds The stress is worse Grieving - mom died 2017 Son died  in a MVA 2014 Husband w/cancer 2019 discussed

## 2017-10-11 NOTE — Assessment & Plan Note (Signed)
?  costochondritis EKG CXR Labs

## 2017-10-12 LAB — IRON,TIBC AND FERRITIN PANEL
%SAT: 1 % (calc) — ABNORMAL LOW (ref 11–50)
Ferritin: 1 ng/mL — ABNORMAL LOW (ref 10–232)
Iron: <10 ug/dL — ABNORMAL LOW (ref 45–160)
TIBC: 514 mcg/dL (calc) — ABNORMAL HIGH (ref 250–450)

## 2017-10-13 ENCOUNTER — Telehealth: Payer: Self-pay | Admitting: Internal Medicine

## 2017-10-13 NOTE — Telephone Encounter (Signed)
Pt scheduled to see Hyacinth MeekerJennifer Lemmon PA 10/14/17@2 :45pm. Please notify pt of appt.

## 2017-10-13 NOTE — Telephone Encounter (Signed)
Left message for patient to return my call to confirm appointment. °

## 2017-10-14 ENCOUNTER — Encounter (HOSPITAL_COMMUNITY): Payer: Self-pay | Admitting: *Deleted

## 2017-10-14 ENCOUNTER — Encounter: Payer: Self-pay | Admitting: Physician Assistant

## 2017-10-14 ENCOUNTER — Ambulatory Visit: Payer: BLUE CROSS/BLUE SHIELD | Admitting: Physician Assistant

## 2017-10-14 ENCOUNTER — Inpatient Hospital Stay (HOSPITAL_COMMUNITY)
Admission: AD | Admit: 2017-10-14 | Discharge: 2017-10-16 | DRG: 812 | Disposition: A | Discharge status: Home / Self Care | Payer: BLUE CROSS/BLUE SHIELD | Source: Ambulatory Visit | Attending: Family Medicine | Admitting: Family Medicine

## 2017-10-14 VITALS — BP 110/70 | HR 113 | Ht 62.0 in | Wt 209.0 lb

## 2017-10-14 DIAGNOSIS — K319 Disease of stomach and duodenum, unspecified: Secondary | ICD-10-CM | POA: Diagnosis not present

## 2017-10-14 DIAGNOSIS — Z79899 Other long term (current) drug therapy: Secondary | ICD-10-CM | POA: Diagnosis not present

## 2017-10-14 DIAGNOSIS — R5383 Other fatigue: Secondary | ICD-10-CM | POA: Diagnosis not present

## 2017-10-14 DIAGNOSIS — D509 Iron deficiency anemia, unspecified: Secondary | ICD-10-CM | POA: Diagnosis not present

## 2017-10-14 DIAGNOSIS — D649 Anemia, unspecified: Secondary | ICD-10-CM | POA: Diagnosis not present

## 2017-10-14 DIAGNOSIS — K2100 Gastro-esophageal reflux disease with esophagitis, without bleeding: Secondary | ICD-10-CM | POA: Diagnosis present

## 2017-10-14 DIAGNOSIS — K449 Diaphragmatic hernia without obstruction or gangrene: Secondary | ICD-10-CM | POA: Diagnosis not present

## 2017-10-14 DIAGNOSIS — D500 Iron deficiency anemia secondary to blood loss (chronic): Secondary | ICD-10-CM | POA: Diagnosis not present

## 2017-10-14 DIAGNOSIS — R195 Other fecal abnormalities: Secondary | ICD-10-CM | POA: Diagnosis not present

## 2017-10-14 DIAGNOSIS — K2970 Gastritis, unspecified, without bleeding: Secondary | ICD-10-CM | POA: Diagnosis not present

## 2017-10-14 LAB — CBC WITH DIFFERENTIAL/PLATELET
Basophils Absolute: 0.1 10*3/uL (ref 0.0–0.1)
Basophils Relative: 1 %
Eosinophils Absolute: 0.2 10*3/uL (ref 0.0–0.7)
Eosinophils Relative: 2 %
HCT: 23 % — ABNORMAL LOW (ref 36.0–46.0)
Hemoglobin: 5.8 g/dL — CL (ref 12.0–15.0)
Lymphocytes Relative: 14 %
Lymphs Abs: 1.2 10*3/uL (ref 0.7–4.0)
MCH: 15.3 pg — ABNORMAL LOW (ref 26.0–34.0)
MCHC: 25.2 g/dL — ABNORMAL LOW (ref 30.0–36.0)
MCV: 60.5 fL — ABNORMAL LOW (ref 78.0–100.0)
Monocytes Absolute: 0.4 10*3/uL (ref 0.1–1.0)
Monocytes Relative: 5 %
Neutro Abs: 6.4 10*3/uL (ref 1.7–7.7)
Neutrophils Relative %: 78 %
Platelets: 390 10*3/uL (ref 150–400)
RBC: 3.8 MIL/uL — ABNORMAL LOW (ref 3.87–5.11)
RDW: 20.4 % — ABNORMAL HIGH (ref 11.5–15.5)
WBC: 8.3 10*3/uL (ref 4.0–10.5)

## 2017-10-14 LAB — COMPREHENSIVE METABOLIC PANEL
ALT: 15 U/L (ref 14–54)
AST: 34 U/L (ref 15–41)
Albumin: 4.2 g/dL (ref 3.5–5.0)
Alkaline Phosphatase: 58 U/L (ref 38–126)
Anion gap: 10 (ref 5–15)
BUN: 18 mg/dL (ref 6–20)
CO2: 21 mmol/L — ABNORMAL LOW (ref 22–32)
Calcium: 9 mg/dL (ref 8.9–10.3)
Chloride: 105 mmol/L (ref 101–111)
Creatinine, Ser: 0.98 mg/dL (ref 0.44–1.00)
GFR calc Af Amer: >60 mL/min (ref >60)
GFR calc non Af Amer: >60 mL/min (ref >60)
Glucose, Bld: 208 mg/dL — ABNORMAL HIGH (ref 65–99)
Potassium: 3.9 mmol/L (ref 3.5–5.1)
Sodium: 136 mmol/L (ref 135–145)
Total Bilirubin: 0.4 mg/dL (ref 0.3–1.2)
Total Protein: 7.7 g/dL (ref 6.5–8.1)

## 2017-10-14 LAB — VITAMIN B12: Vitamin B-12: 257 pg/mL (ref 180–914)

## 2017-10-14 LAB — RETICULOCYTES
RBC.: 3.8 MIL/uL — ABNORMAL LOW (ref 3.87–5.11)
Retic Count, Absolute: 117.8 10*3/uL (ref 19.0–186.0)
Retic Ct Pct: 3.1 % (ref 0.4–3.1)

## 2017-10-14 LAB — PREPARE RBC (CROSSMATCH)

## 2017-10-14 LAB — ABO/RH: ABO/RH(D): A NEG

## 2017-10-14 LAB — PROTIME-INR
INR: 1.03
Prothrombin Time: 13.4 seconds (ref 11.4–15.2)

## 2017-10-14 LAB — FOLATE: Folate: 18.3 ng/mL (ref >5.9)

## 2017-10-14 MED ORDER — SODIUM CHLORIDE 0.9 % IV SOLN
Freq: Once | INTRAVENOUS | Status: AC
  Administered 2017-10-14: 19:00:00 via INTRAVENOUS

## 2017-10-14 NOTE — Consult Note (Signed)
Consult Note for [facility]  Reason for Consult: Severe iron deficiency anemia Referring Physician: [facility]  Hershal Coria HPI: This is a 52 year old female with a PMH of anemia with a baseline HGB in the 10 g/dL range admitted for an HGB of 5.8 g/dL.  Her MCV was noted to be at 60.5, but she has a history of microcytosis.  There is no known family history of anemias.  Her iron saturation is at 1% and her ferritin is at 1.  In the chart there is a listing for a visit with Dr. Marina Goodell on 05/2006, but the records are not accessible.  She feels that Dr. Marina Goodell saw her for abdominal pain complaints and an EGD was performed with minimal findings.  The patient denies any issues with hematochezia, melena, abdominal pain, hematuria, or hemoptysis.  There is no known family history of colon cancer.  She was referred to [facility] for further work up, but in the office today she was noted to be tachycardic.  The tachycardia and her severe anemia prompted the admission.  Per her report, she started to feel progressive fatigue over the past two months.  During this time she was using two tablets of ASA 325 gm QHS for generalized body aches.  Past Medical History:  Diagnosis Date  . Anemia   . Depression   . GERD (gastroesophageal reflux disease)     History reviewed. No pertinent surgical history.  Family History  Problem Relation Age of Onset  . Diabetes Mother 6  . Hypertension Mother   . Parkinson's disease Father   . Asthma Father   . Cancer Sister   . Heart Problems Maternal Grandmother   . Asthma Maternal Grandfather     Social History:  reports that she has never smoked. She has never used smokeless tobacco. She reports that she does not drink alcohol or use drugs.  Allergies: No Known Allergies  Medications: Scheduled: Continuous:  Results for orders placed or performed during the hospital encounter of 10/14/17 (from the past 24 hour(s))  CBC with Differential/Platelet      Status: Abnormal (Preliminary result)   Collection Time: 10/14/17  6:18 PM  Result Value Ref Range   WBC 8.3 4.0 - 10.5 K/uL   RBC 3.80 (L) 3.87 - 5.11 MIL/uL   Hemoglobin 5.8 (LL) 12.0 - 15.0 g/dL   HCT 16.1 (L) 09.6 - 04.5 %   MCV 60.5 (L) 78.0 - 100.0 fL   MCH 15.3 (L) 26.0 - 34.0 pg   MCHC 25.2 (L) 30.0 - 36.0 g/dL   RDW 40.9 (H) 81.1 - 91.4 %   Platelets 390 150 - 400 K/uL   Neutrophils Relative % PENDING %   Neutro Abs PENDING 1.7 - 7.7 K/uL   Band Neutrophils PENDING %   Lymphocytes Relative PENDING %   Lymphs Abs PENDING 0.7 - 4.0 K/uL   Monocytes Relative PENDING %   Monocytes Absolute PENDING 0.1 - 1.0 K/uL   Eosinophils Relative PENDING %   Eosinophils Absolute PENDING 0.0 - 0.7 K/uL   Basophils Relative PENDING %   Basophils Absolute PENDING 0.0 - 0.1 K/uL   WBC Morphology PENDING    RBC Morphology PENDING    Smear Review PENDING    nRBC PENDING 0 /100 WBC   Metamyelocytes Relative PENDING %   Myelocytes PENDING %   Promyelocytes Relative PENDING %   Blasts PENDING %  Protime-INR     Status: None   Collection Time: 10/14/17  6:18 PM  Result Value Ref Range   Prothrombin Time 13.4 11.4 - 15.2 seconds   INR 1.03   Comprehensive metabolic panel     Status: Abnormal   Collection Time: 10/14/17  6:18 PM  Result Value Ref Range   Sodium 136 135 - 145 mmol/L   Potassium 3.9 3.5 - 5.1 mmol/L   Chloride 105 101 - 111 mmol/L   CO2 21 (L) 22 - 32 mmol/L   Glucose, Bld 208 (H) 65 - 99 mg/dL   BUN 18 6 - 20 mg/dL   Creatinine, Ser 1.610.98 0.44 - 1.00 mg/dL   Calcium 9.0 8.9 - 09.610.3 mg/dL   Total Protein 7.7 6.5 - 8.1 g/dL   Albumin 4.2 3.5 - 5.0 g/dL   AST 34 15 - 41 U/L   ALT 15 14 - 54 U/L   Alkaline Phosphatase 58 38 - 126 U/L   Total Bilirubin 0.4 0.3 - 1.2 mg/dL   GFR calc non Af Amer >60 >60 mL/min   GFR calc Af Amer >60 >60 mL/min   Anion gap 10 5 - 15  Type and screen [facility]     Status: None (Preliminary result)   Collection Time:  10/14/17  6:18 PM  Result Value Ref Range   ABO/RH(D) A NEG    Antibody Screen PENDING    Sample Expiration      10/17/2017 Performed at [facility], [address] [place], [place] [address]   Reticulocytes     Status: Abnormal   Collection Time: 10/14/17  6:18 PM  Result Value Ref Range   Retic Ct Pct 3.1 0.4 - 3.1 %   RBC. 3.80 (L) 3.87 - 5.11 MIL/uL   Retic Count, Absolute 117.8 19.0 - 186.0 K/uL  Prepare RBC     Status: None   Collection Time: 10/14/17  6:18 PM  Result Value Ref Range   Order Confirmation      ORDER PROCESSED BY BLOOD BANK Performed at [facility], [address] [place], [place] [address]      No results found.  ROS:  As stated above in the HPI otherwise negative.  Blood pressure (!) 141/78, pulse (!) 134, temperature 98.3 F (36.8 C), temperature source Oral, resp. rate 18, SpO2 100 %.    PE: Gen: NAD, Alert and Oriented, pale HEENT:  [place]/AT, EOMI Neck: Supple, no LAD Lungs: CTA Bilaterally CV: RRR without M/G/R ABM: Soft, NTND, +BS Ext: No C/C/E  Assessment/Plan: 1) Severe IDA. 2) Fatigue. 3) SOB and chest pain.   She has severe IDA that requires further work up.  It is not clear about the timing of using the ASA.  Because of her complaints of SOB and mild chest discomfort, the EGD/Colonoscopy will be held until she is s/p blood transfusions.  Plan: 1) Agree with blood transfusions. 2) EGD/Colonoscopy on Sunday once she is more hemodynamically stable.  Kahli Fitzgerald D 10/14/2017, 7:02 PM

## 2017-10-14 NOTE — Plan of Care (Signed)
Received call for direct admission from [facility] office. Patient presented after seeing PCP on 6/11 with chest pain/dyspnea. Found to have a hemoglobin of 5.7. Seen today at GI office. No evidence of bleeding. Patient with significant iron deficiency. Vitals stable except mild tachycardia. With regard to chest pain, review of EKG is significant for sinus rhythm; likely related to anemia. Since no active bleeding, okay for medical floor, observation status. Patient agreeable to blood transfusion. [facility] PA to contact inpatient team regarding patient transfer to the hospital.  Jacquelin Hawkingalph Ruther Ephraim, MD [facility] 10/14/2017, 3:48 PM

## 2017-10-14 NOTE — Progress Notes (Signed)
Patient is admitted to room 1536.

## 2017-10-14 NOTE — Progress Notes (Signed)
Chief Complaint: Symptomatic anemia  HPI:    Mrs. Amanda [name] is a 52 year old female, history with Dr. Marina GoodellPerry, with a past medical history as listed below, who was referred to me by Plotnikov, Georgina QuintAleksei V, MD for a complaint of symptomatic anemia.      05/10/2006 EGD by Dr. Marina GoodellPerry for microcytic anemia with hematemesis with finding of reflux esophagitis, hiatal hernia and acute gastritis as well as GERD.    10/11/2017 CBC with a hemoglobin of 5.7 (10.1-year ago), MCV low at 54.9.  BMP normal.  LFTs normal.  H. pylori IgG negative.  Iron low less than 10, TIBC 514, percent saturation 1, ferritin 1.    (It should be noted patient is a poor historian and there is some language barrier.)     Today, explains that she has been on prescription iron for 2 years as she was told this was low in the past, but she is unsure what the cause of this was as it was never worked up.  Lately they "stopped prescribing the iron and told me to buy it over-the-counter".  She bought over-the-counter iron and upon taking this developed terrible back pain so she discontinued this a week or so ago.  Overall patient feels "terrible".  Describes some chest pressure/pain, dyspnea on exertion, nausea and dizziness with positional changes as well as generalized weakness.  Has been feeling this way for 2 to 3 months.  Denies any signs of acute blood loss.  Denies seeing any bright red blood in her stool or black tarry sticky stools.      Does tell me she has been having trouble with constipation worse over the past 2 months with some green stool produced which is "hard to get out".    Past medical history is positive for heavy menstrual periods though patient tells me that these stopped a year ago.    Denies fever, weight loss, vomiting or symptoms that awaken her at night.  Past Medical History:  Diagnosis Date  . Anemia   . Depression   . GERD (gastroesophageal reflux disease)     History reviewed. No pertinent surgical  history.  Current Outpatient Medications  Medication Sig Dispense Refill  . b complex vitamins tablet Take 1 tablet by mouth daily. 100 tablet 3  . cetirizine (ZYRTEC) 10 MG tablet Take 10 mg by mouth daily.    . Cyanocobalamin (VITAMIN B-12) 1000 MCG SUBL Place 1 tablet (1,000 mcg total) under the tongue daily. 100 tablet 3  . zolpidem (AMBIEN) 5 MG tablet Take 0.5-1 tablets (2.5-5 mg total) by mouth at bedtime as needed for sleep. 30 tablet 1   No current facility-administered medications for this visit.     Allergies as of 10/14/2017  . (No Known Allergies)    Family History  Problem Relation Age of Onset  . Diabetes Mother 170  . Hypertension Mother   . Parkinson's disease Father   . Asthma Father   . Cancer Sister   . Heart Problems Maternal Grandmother   . Asthma Maternal Grandfather     Social History   Socioeconomic History  . Marital status: Married    Spouse name: Not on file  . Number of children: Not on file  . Years of education: Not on file  . Highest education level: Not on file  Occupational History  . Not on file  Social Needs  . Financial resource strain: Not on file  . Food insecurity:    Worry: Not on file  Inability: Not on file  . Transportation needs:    Medical: Not on file    Non-medical: Not on file  Tobacco Use  . Smoking status: Never Smoker  . Smokeless tobacco: Never Used  Substance and Sexual Activity  . Alcohol use: No  . Drug use: No  . Sexual activity: Not on file  Lifestyle  . Physical activity:    Days per week: Not on file    Minutes per session: Not on file  . Stress: Not on file  Relationships  . Social connections:    Talks on phone: Not on file    Gets together: Not on file    Attends religious service: Not on file    Active member of club or organization: Not on file    Attends meetings of clubs or organizations: Not on file    Relationship status: Not on file  . Intimate partner violence:    Fear of current  or ex partner: Not on file    Emotionally abused: Not on file    Physically abused: Not on file    Forced sexual activity: Not on file  Other Topics Concern  . Not on file  Social History Narrative  . Not on file    Review of Systems:    Constitutional: No weight loss, fever or chills Skin: No rash Cardiovascular: +chest pressure Respiratory: +DOE Gastrointestinal: See HPI and otherwise negative Genitourinary: No dysuria  Neurological: + positional dizziness Musculoskeletal: No new muscle or joint pain Hematologic: No bleeding Psychiatric: No history of depression or anxiety   Physical Exam:  Vital signs: BP 110/70   Pulse (!) 113   Ht 5\' 2"  (1.575 m)   Wt 209 lb (94.8 kg)   BMI 38.23 kg/m   Constitutional:   Very pale appearing Pleasant female appears to be in NAD, Well developed, Well nourished, alert and cooperative Head:  Normocephalic and atraumatic. Eyes:   PEERL, EOMI. No icterus. Conjunctiva pale Ears:  Normal auditory acuity. Neck:  Supple Throat: Oral cavity and pharynx without inflammation, swelling or lesion.  Respiratory: Respirations even and unlabored. Lungs clear to auscultation bilaterally.   No wheezes, crackles, or rhonchi.  Cardiovascular: Normal S1, S2. No MRG. Regular rate and rhythm. No peripheral edema, cyanosis or pallor.  Gastrointestinal:  Soft, nondistended, nontender. No rebound or guarding. Normal bowel sounds. No appreciable masses or hepatomegaly. Rectal:  Not performed.  Msk:  Symmetrical without gross deformities. Without edema, no deformity or joint abnormality.  Neurologic:  Alert and  oriented x4;  grossly normal neurologically.  Skin:   Dry and intact without significant lesions or rashes. Psychiatric: Demonstrates good judgement and reason without abnormal affect or behaviors.  RELEVANT LABS AND IMAGING: CBC    Component Value Date/Time   WBC 4.4 10/11/2017 1408   RBC 3.59 (L) 10/11/2017 1408   HGB 5.7 Repeated and verified  X2. (LL) 10/11/2017 1408   HCT 19.7 Repeated and verified X2. (LL) 10/11/2017 1408   PLT 269.0 10/11/2017 1408   MCV 54.9 Repeated and verified X2. (L) 10/11/2017 1408   MCHC 28.8 Repeated and verified X2. (L) 10/11/2017 1408   RDW 20.7 (H) 10/11/2017 1408   LYMPHSABS 1.1 10/11/2017 1408   MONOABS 0.4 10/11/2017 1408   EOSABS 0.2 10/11/2017 1408   BASOSABS 0.1 10/11/2017 1408    CMP     Component Value Date/Time   NA 140 10/11/2017 1408   K 3.8 10/11/2017 1408   CL 104 10/11/2017 1408  CO2 27 10/11/2017 1408   GLUCOSE 103 (H) 10/11/2017 1408   BUN 12 10/11/2017 1408   CREATININE 0.84 10/11/2017 1408   CALCIUM 9.2 10/11/2017 1408   PROT 7.5 10/11/2017 1408   ALBUMIN 4.2 10/11/2017 1408   AST 12 10/11/2017 1408   ALT 11 10/11/2017 1408   ALKPHOS 60 10/11/2017 1408   BILITOT 0.4 10/11/2017 1408   GFRNONAA 83.73 01/20/2009 1705   GFRAA 89 08/09/2007 1144    Assessment: 1.  Symptomatic anemia: See recent labs 10/11/2017 with hemoglobin of 5.7, iron studies also show extremely low ferritin at 1 and iron at 1, patient describes vague history of anemia in the past and being on iron with no cause, currently having chest pressure/pain, positional dizziness, dyspnea on exertion and weakness, likely all related to iron deficiency anemia  Plan: 1.  Spent a long time with this patient.  Initially she was refusing blood products.  She did ask me to call her husband and I discussed recommendations with him.  Explained that she should proceed to the hospital for admission where she would be requiring at least 2 to 3 units of blood and further work-up including EGD and colonoscopy.  Eventually they both agreed.  Spoke with hospitalist who agreed to admit. 2.  Spoke with our inpatient team who are aware that patient is being directly admitted to the hospitalist team.  Please call them once she arrives. 3.  Patient will need at least 2 to 3 units of blood and further monitoring of her hemoglobin  with transfusion less than 7. 4.  Patient will also benefit from IV iron 5.  Once patient is more stabilized and hemoglobin over 7 she will need procedures including EGD and colonoscopy for further evaluation.  This was discussed during her clinic visit with me and she agrees to proceed. 6. Discussed case with Dr. Lavon Paganini 7. Please await further recommendations from our inpatient team- Dr. Adela Lank today and Dr. Elnoria Howard covering our service over the weekend.  Hyacinth Meeker, PA-C [facility] 10/14/2017, 2:42 PM  Cc: [name], MD

## 2017-10-14 NOTE — Patient Instructions (Signed)
If you are age 50 or older, your body mass index should be between 23-30. Your Body mass index is 38.23 kg/m. If this is out of the aforementioned range listed, please consider follow up with your Primary Care Provider.  If you are age 364 or younger, your body mass index should be between 19-25. Your Body mass index is 38.23 kg/m. If this is out of the aformentioned range listed, please consider follow up with your Primary Care Provider.   We will call you as soon as [facility] is [facility].  Thank you for choosing me and [facility].   Hyacinth MeekerJennifer Lemmon, PA-C

## 2017-10-14 NOTE — H&P (Signed)
History and Physical  Amanda [name] ZOX:[id number] DOB: 1966/01/08 DOA: 10/14/2017  Referring physician: EDP PCP: [name], MD   Chief Complaint: Shortness of breath and weakness  HPI: Amanda [name] is a 52 y.o. female   With history of microcytic anemia, currently not taking any medication, is referred from [facility] GI office for direct admission due to symptomatic anemia.  Patient started feel short of breath and weak, was evaluated by primary care doctor on 6/11and she is anemic hemoglobin 5.7.  Iron <10, Tsats less than 1%, ferritin 1, She had a EKG done on 6/11 which is unremarkable.  She denies any active bleed.  She states she stopped having menses a year ago, she denies blood in the stool.  No hematemesis.  No hematuria.  She denies weight loss. She reports occasional epigastric pain after eating.  No Chest pain, no syncope.  Primary care physician sent her to GI for further evaluation for anemia, she is found to have tachycardia, she is sent to [facility] for direct admission.  Review of Systems:  Detail per HPI, Review of systems are otherwise negative  Past Medical History:  Diagnosis Date  . Anemia   . Depression   . GERD (gastroesophageal reflux disease)    No past surgical history on file. Social History:  reports that she has never smoked. She has never used smokeless tobacco. She reports that she does not drink alcohol or use drugs. Patient lives at home & is able to participate in activities of daily living independently   No Known Allergies  Family History  Problem Relation Age of Onset  . Diabetes Mother 74  . Hypertension Mother   . Parkinson's disease Father   . Asthma Father   . Cancer Sister   . Heart Problems Maternal Grandmother   . Asthma Maternal Grandfather       Prior to Admission medications   Medication Sig Start Date End Date Taking? Authorizing Provider  b complex vitamins tablet Take 1 tablet by mouth daily. 08/09/16   Plotnikov,  Georgina Quint, MD  cetirizine (ZYRTEC) 10 MG tablet Take 10 mg by mouth daily.    [provider]  Cyanocobalamin (VITAMIN B-12) 1000 MCG SUBL Place 1 tablet (1,000 mcg total) under the tongue daily. 10/11/17   Plotnikov, Georgina Quint, MD  zolpidem (AMBIEN) 5 MG tablet Take 0.5-1 tablets (2.5-5 mg total) by mouth at bedtime as needed for sleep. 10/11/17   Plotnikov, Georgina Quint, MD    Physical Exam: BP (!) 141/78 (BP Location: Left Arm)   Pulse (!) 134   Temp 98.3 F (36.8 C) (Oral)   Resp 18   SpO2 100%   General:  NAD, pale Eyes: PERRL ENT: unremarkable Neck: supple, no JVD Cardiovascular: sinus tachcyardia Respiratory: CTABL Abdomen: soft/ND/ND, positive bowel sounds Skin: no rash Musculoskeletal:  No edema Psychiatric: calm/cooperative Neurologic: no focal findings            Labs on Admission:  Basic Metabolic Panel: Recent Labs  Lab 10/11/17 1408  NA 140  K 3.8  CL 104  CO2 27  GLUCOSE 103*  BUN 12  CREATININE 0.84  CALCIUM 9.2   Liver Function Tests: Recent Labs  Lab 10/11/17 1408  AST 12  ALT 11  ALKPHOS 60  BILITOT 0.4  PROT 7.5  ALBUMIN 4.2   No results for input(s): LIPASE, AMYLASE in the last 168 hours. No results for input(s): AMMONIA in the last 168 hours. CBC: Recent Labs  Lab 10/11/17  1408  WBC 4.4  NEUTROABS 2.7  HGB 5.7 Repeated and verified X2.*  HCT 19.7 Repeated and verified X2.*  MCV 54.9 Repeated and verified X2.*  PLT 269.0   Cardiac Enzymes: No results for input(s): CKTOTAL, CKMB, CKMBINDEX, TROPONINI in the last 168 hours.  BNP (last 3 results) No results for input(s): BNP in the last 8760 hours.  ProBNP (last 3 results) No results for input(s): PROBNP in the last 8760 hours.  CBG: No results for input(s): GLUCAP in the last 168 hours.  Radiological Exams on Admission: No results found.    Assessment/Plan Present on Admission: . ANEMIA-IRON DEFICIENCY . ESOPHAGITIS, REFLUX . Symptomatic  anemia   Symptomatic anemia -direct admit from [facility]  -no active bleed -t bili wnl , she is less likely having hemolysis.  -she is profound iron deficient, from chronic gi bleed vs malabsorption? -will check b12/folate/INR/retic/tsh -Transfuse prbcx2, she will need iv iron as well -case discussed with [facility], likely scope on Sunday ( Dr Elnoria HowardHung will cover over the weekend)  H/o hiatal hernia, GERD/ reflux esophagitis She does not take any meds at home.  She reports occasional epigastric pain after eating Gi will see patient      DVT prophylaxis: scd's  Consultants: [facility]  Code Status: full   Family Communication:  Patient and husband at bedside  Disposition Plan: admit to med surg  Time spent: 50mins  Albertine GratesFang Jissell Trafton MD, PhD [facility] Pager [phone] If 7PM-7AM, please contact night-coverage at [email or web address], password [email or web address]

## 2017-10-15 ENCOUNTER — Other Ambulatory Visit: Payer: Self-pay

## 2017-10-15 DIAGNOSIS — D649 Anemia, unspecified: Secondary | ICD-10-CM

## 2017-10-15 DIAGNOSIS — D500 Iron deficiency anemia secondary to blood loss (chronic): Secondary | ICD-10-CM

## 2017-10-15 DIAGNOSIS — K210 Gastro-esophageal reflux disease with esophagitis: Secondary | ICD-10-CM

## 2017-10-15 LAB — CBC
HCT: 29.1 % — ABNORMAL LOW (ref 36.0–46.0)
Hemoglobin: 8.2 g/dL — ABNORMAL LOW (ref 12.0–15.0)
MCH: 18.8 pg — ABNORMAL LOW (ref 26.0–34.0)
MCHC: 28.2 g/dL — ABNORMAL LOW (ref 30.0–36.0)
MCV: 66.7 fL — ABNORMAL LOW (ref 78.0–100.0)
Platelets: 349 10*3/uL (ref 150–400)
RBC: 4.36 MIL/uL (ref 3.87–5.11)
RDW: 25.5 % — ABNORMAL HIGH (ref 11.5–15.5)
WBC: 7.8 10*3/uL (ref 4.0–10.5)

## 2017-10-15 LAB — BASIC METABOLIC PANEL
Anion gap: 5 (ref 5–15)
BUN: 14 mg/dL (ref 6–20)
CO2: 28 mmol/L (ref 22–32)
Calcium: 9.2 mg/dL (ref 8.9–10.3)
Chloride: 108 mmol/L (ref 101–111)
Creatinine, Ser: 0.81 mg/dL (ref 0.44–1.00)
GFR calc Af Amer: >60 mL/min (ref >60)
GFR calc non Af Amer: >60 mL/min (ref >60)
Glucose, Bld: 114 mg/dL — ABNORMAL HIGH (ref 65–99)
Potassium: 4.3 mmol/L (ref 3.5–5.1)
Sodium: 141 mmol/L (ref 135–145)

## 2017-10-15 LAB — HIV ANTIBODY (ROUTINE TESTING W REFLEX): HIV Screen 4th Generation wRfx: NONREACTIVE

## 2017-10-15 LAB — IRON AND TIBC
Iron: 15 ug/dL — ABNORMAL LOW (ref 28–170)
Saturation Ratios: 3 % — ABNORMAL LOW (ref 10.4–31.8)
TIBC: 513 ug/dL — ABNORMAL HIGH (ref 250–450)
UIBC: 498 ug/dL

## 2017-10-15 MED ORDER — PEG 3350-KCL-NA BICARB-NACL 420 G PO SOLR
4000.0000 mL | Freq: Once | ORAL | Status: AC
  Administered 2017-10-15: 4000 mL via ORAL

## 2017-10-15 NOTE — Progress Notes (Signed)
PROGRESS NOTE    Amanda Coriaadia Maceachern  ZOX:[id number] DOB: 1965-10-29 DOA: 10/14/2017 PCP: Tresa GarterPlotnikov, Aleksei V, MD   Brief Narrative: Amanda [name] is a 52 y.o. female with a history of eophagitis, depression, B12 deficiency. Patient presented from her GI office secondary to symptomatic anemia.   Assessment & Plan:   Active Problems:   ANEMIA-IRON DEFICIENCY   ESOPHAGITIS, REFLUX   Symptomatic anemia   Symptomatic anemia Patient given 2 units of PRBC for a hemoglobin of 5.8 on admission. Improved to 8.2 today. Chest pain and dyspnea improved. Doubt this is all secondary to iron deficiency. Vitamin B12 normal. -GI: EGD and colonoscopy tomorrow -FOBT pending  Iron deficiency anemia Likely contributed to significant anemia.  Chest pain Secondary to anemia. EKG normal sinus rhythm from outpatient. Resolved.   DVT prophylaxis: SCDs Code Status:   Code Status: Full Code Family Communication: None at bedside Disposition Plan: Discharge in 1-2 days pending GI management   Consultants:   Gastroenterology  Procedures:   2 units of PRBC (6/14)  Antimicrobials:  None    Subjective: No issues. No chest pain or dyspnea.  Objective: Vitals:   10/15/17 0143 10/15/17 0209 10/15/17 0214 10/15/17 0510  BP: 125/71 110/72 108/65 114/69  Pulse: 89 89 85 85  Resp: 16 18 16 13   Temp: 98.7 F (37.1 C) 98.3 F (36.8 C) 98.3 F (36.8 C) 98.7 F (37.1 C)  TempSrc: Oral Oral  Oral  SpO2: 100% 99% 100% 100%    Intake/Output Summary (Last 24 hours) at 10/15/2017 1151 Last data filed at 10/15/2017 1025 Gross per 24 hour  Intake 1987 ml  Output 400 ml  Net 1587 ml   There were no vitals filed for this visit.  Examination:  General exam: Appears calm and comfortable Respiratory system: Clear to auscultation. Respiratory effort normal. Cardiovascular system: S1 & S2 heard, RRR. No murmurs, rubs, gallops or clicks. Gastrointestinal system: Abdomen is nondistended, soft and  nontender. Normal bowel sounds heard. Central nervous system: Alert and oriented. No focal neurological deficits. Extremities: No edema. No calf tenderness Skin: No cyanosis. No rashes. Somewhat pale Psychiatry: Judgement and insight appear normal. Mood & affect appropriate.     Data Reviewed: I have personally reviewed following labs and imaging studies  CBC: Recent Labs  Lab 10/11/17 1408 10/14/17 1818 10/15/17 0727  WBC 4.4 8.3 7.8  NEUTROABS 2.7 6.4  --   HGB 5.7 Repeated and verified X2.* 5.8* 8.2*  HCT 19.7 Repeated and verified X2.* 23.0* 29.1*  MCV 54.9 Repeated and verified X2.* 60.5* 66.7*  PLT 269.0 390 349   Basic Metabolic Panel: Recent Labs  Lab 10/11/17 1408 10/14/17 1818 10/15/17 0727  NA 140 136 141  K 3.8 3.9 4.3  CL 104 105 108  CO2 27 21* 28  GLUCOSE 103* 208* 114*  BUN 12 18 14   CREATININE 0.84 0.98 0.81  CALCIUM 9.2 9.0 9.2   GFR: Estimated Creatinine Clearance: 89.2 mL/min (by C-G formula based on SCr of 0.81 mg/dL). Liver Function Tests: Recent Labs  Lab 10/11/17 1408 10/14/17 1818  AST 12 34  ALT 11 15  ALKPHOS 60 58  BILITOT 0.4 0.4  PROT 7.5 7.7  ALBUMIN 4.2 4.2   No results for input(s): LIPASE, AMYLASE in the last 168 hours. No results for input(s): AMMONIA in the last 168 hours. Coagulation Profile: Recent Labs  Lab 10/14/17 1818  INR 1.03   Cardiac Enzymes: No results for input(s): CKTOTAL, CKMB, CKMBINDEX, TROPONINI in the last 168 hours.  BNP (last 3 results) No results for input(s): PROBNP in the last 8760 hours. HbA1C: No results for input(s): HGBA1C in the last 72 hours. CBG: No results for input(s): GLUCAP in the last 168 hours. Lipid Profile: No results for input(s): CHOL, HDL, LDLCALC, TRIG, CHOLHDL, LDLDIRECT in the last 72 hours. Thyroid Function Tests: No results for input(s): TSH, T4TOTAL, FREET4, T3FREE, THYROIDAB in the last 72 hours. Anemia Panel: Recent Labs    10/14/17 1818 10/15/17 0727    VITAMINB12 257  --   FOLATE 18.3  --   TIBC  --  513*  IRON  --  15*  RETICCTPCT 3.1  --    Sepsis Labs: No results for input(s): PROCALCITON, LATICACIDVEN in the last 168 hours.  No results found for this or any previous visit (from the past 240 hour(s)).       Radiology Studies: No results found.      Scheduled Meds: . polyethylene glycol-electrolytes  4,000 mL Oral Once   Continuous Infusions:   LOS: 1 day     Jacquelin Hawking, MD [facility] 10/15/2017, 11:51 AM Pager: [phone]  If 7PM-7AM, please contact night-coverage [email or web address] 10/15/2017, 11:51 AM

## 2017-10-15 NOTE — Progress Notes (Signed)
Progress note for [facility]  Subjective: No complaints.  Feeling well after the blood transfusions.  Objective: Vital signs in last 24 hours: Temp:  [98.3 F (36.8 C)-99.6 F (37.6 C)] 98.7 F (37.1 C) (06/15 0510) Pulse Rate:  [85-134] 85 (06/15 0510) Resp:  [13-18] 13 (06/15 0510) BP: (102-141)/(60-78) 114/69 (06/15 0510) SpO2:  [99 %-100 %] 100 % (06/15 0510) Weight:  [94.8 kg (209 lb)] 94.8 kg (209 lb) (06/14 1437) Last BM Date: 10/14/17  Intake/Output from previous day: 06/14 0701 - 06/15 0700 In: 1257 [P.O.:200; Blood:1057] Out: 400 [Urine:400] Intake/Output this shift: No intake/output data recorded.  General appearance: alert and no distress GI: soft, non-tender; bowel sounds normal; no masses,  no organomegaly  Lab Results: Recent Labs    10/14/17 1818  WBC 8.3  HGB 5.8*  HCT 23.0*  PLT 390   BMET Recent Labs    10/14/17 1818  NA 136  K 3.9  CL 105  CO2 21*  GLUCOSE 208*  BUN 18  CREATININE 0.98  CALCIUM 9.0   LFT Recent Labs    10/14/17 1818  PROT 7.7  ALBUMIN 4.2  AST 34  ALT 15  ALKPHOS 58  BILITOT 0.4   PT/INR Recent Labs    10/14/17 1818  LABPROT 13.4  INR 1.03   Hepatitis Panel No results for input(s): HEPBSAG, HCVAB, HEPAIGM, HEPBIGM in the last 72 hours. C-Diff No results for input(s): CDIFFTOX in the last 72 hours. Fecal Lactopherrin No results for input(s): FECLLACTOFRN in the last 72 hours.  Studies/Results: No results found.  Medications: Scheduled: Continuous:  Assessment/Plan: 1) Severe IDA.   Clinically she feels better, but she is still very pale.  AM HGB is not available at this time.  Plan: 1) EGD/Colonoscopy tomorrow.  LOS: 1 day   Amanda [name] D 10/15/2017, 7:43 AM 

## 2017-10-15 NOTE — H&P (View-Only) (Signed)
Progress note for [facility]  Subjective: No complaints.  Feeling well after the blood transfusions.  Objective: Vital signs in last 24 hours: Temp:  [98.3 F (36.8 C)-99.6 F (37.6 C)] 98.7 F (37.1 C) (06/15 0510) Pulse Rate:  [85-134] 85 (06/15 0510) Resp:  [13-18] 13 (06/15 0510) BP: (102-141)/(60-78) 114/69 (06/15 0510) SpO2:  [99 %-100 %] 100 % (06/15 0510) Weight:  [94.8 kg (209 lb)] 94.8 kg (209 lb) (06/14 1437) Last BM Date: 10/14/17  Intake/Output from previous day: 06/14 0701 - 06/15 0700 In: 1257 [P.O.:200; Blood:1057] Out: 400 [Urine:400] Intake/Output this shift: No intake/output data recorded.  General appearance: alert and no distress GI: soft, non-tender; bowel sounds normal; no masses,  no organomegaly  Lab Results: Recent Labs    10/14/17 1818  WBC 8.3  HGB 5.8*  HCT 23.0*  PLT 390   BMET Recent Labs    10/14/17 1818  NA 136  K 3.9  CL 105  CO2 21*  GLUCOSE 208*  BUN 18  CREATININE 0.98  CALCIUM 9.0   LFT Recent Labs    10/14/17 1818  PROT 7.7  ALBUMIN 4.2  AST 34  ALT 15  ALKPHOS 58  BILITOT 0.4   PT/INR Recent Labs    10/14/17 1818  LABPROT 13.4  INR 1.03   Hepatitis Panel No results for input(s): HEPBSAG, HCVAB, HEPAIGM, HEPBIGM in the last 72 hours. C-Diff No results for input(s): CDIFFTOX in the last 72 hours. Fecal Lactopherrin No results for input(s): FECLLACTOFRN in the last 72 hours.  Studies/Results: No results found.  Medications: Scheduled: Continuous:  Assessment/Plan: 1) Severe IDA.   Clinically she feels better, but she is still very pale.  AM HGB is not available at this time.  Plan: 1) EGD/Colonoscopy tomorrow.  LOS: 1 day   Kayson Tasker D 10/15/2017, 7:43 AM

## 2017-10-16 ENCOUNTER — Encounter (HOSPITAL_COMMUNITY): Payer: Self-pay | Admitting: *Deleted

## 2017-10-16 ENCOUNTER — Encounter (HOSPITAL_COMMUNITY)
Admission: AD | Disposition: A | Discharge status: Home / Self Care | Payer: Self-pay | Source: Ambulatory Visit | Attending: Family Medicine

## 2017-10-16 HISTORY — PX: COLONOSCOPY: SHX5424

## 2017-10-16 HISTORY — PX: ESOPHAGOGASTRODUODENOSCOPY: SHX5428

## 2017-10-16 HISTORY — PX: BIOPSY: SHX5522

## 2017-10-16 LAB — CBC
HCT: 30 % — ABNORMAL LOW (ref 36.0–46.0)
Hemoglobin: 8.7 g/dL — ABNORMAL LOW (ref 12.0–15.0)
MCH: 19.5 pg — ABNORMAL LOW (ref 26.0–34.0)
MCHC: 29 g/dL — ABNORMAL LOW (ref 30.0–36.0)
MCV: 67.1 fL — ABNORMAL LOW (ref 78.0–100.0)
Platelets: 352 10*3/uL (ref 150–400)
RBC: 4.47 MIL/uL (ref 3.87–5.11)
RDW: 25.8 % — ABNORMAL HIGH (ref 11.5–15.5)
WBC: 7.3 10*3/uL (ref 4.0–10.5)

## 2017-10-16 SURGERY — COLONOSCOPY
Anesthesia: Moderate Sedation

## 2017-10-16 MED ORDER — PANTOPRAZOLE SODIUM 40 MG PO TBEC: 40.0000 mg | DELAYED_RELEASE_TABLET | Freq: Every day | ORAL | 0 refills | Status: DC

## 2017-10-16 MED ORDER — SODIUM CHLORIDE 0.9 % IV SOLN
INTRAVENOUS | Status: DC
  Administered 2017-10-16: 08:00:00 via INTRAVENOUS

## 2017-10-16 MED ORDER — EPINEPHRINE PF 1 MG/10ML IJ SOSY
PREFILLED_SYRINGE | INTRAMUSCULAR | Status: AC
  Filled 2017-10-16: qty 10

## 2017-10-16 MED ORDER — SODIUM CHLORIDE 0.9 % IV SOLN
510.0000 mg | Freq: Once | INTRAVENOUS | Status: AC
  Administered 2017-10-16: 510 mg via INTRAVENOUS
  Filled 2017-10-16: qty 17

## 2017-10-16 MED ORDER — BUTAMBEN-TETRACAINE-BENZOCAINE 2-2-14 % EX AERO
INHALATION_SPRAY | CUTANEOUS | Status: DC | PRN
  Administered 2017-10-16: 2 via TOPICAL

## 2017-10-16 MED ORDER — DIPHENHYDRAMINE HCL 50 MG/ML IJ SOLN
INTRAMUSCULAR | Status: DC | PRN
  Administered 2017-10-16: 25 mg via INTRAVENOUS

## 2017-10-16 MED ORDER — DIPHENHYDRAMINE HCL 50 MG/ML IJ SOLN
INTRAMUSCULAR | Status: AC
  Filled 2017-10-16: qty 1

## 2017-10-16 MED ORDER — FENTANYL CITRATE (PF) 100 MCG/2ML IJ SOLN
INTRAMUSCULAR | Status: AC
  Filled 2017-10-16: qty 4

## 2017-10-16 MED ORDER — FENTANYL CITRATE (PF) 100 MCG/2ML IJ SOLN
INTRAMUSCULAR | Status: DC | PRN
  Administered 2017-10-16 (×4): 25 ug via INTRAVENOUS

## 2017-10-16 MED ORDER — MIDAZOLAM HCL 5 MG/ML IJ SOLN
INTRAMUSCULAR | Status: AC
  Filled 2017-10-16: qty 3

## 2017-10-16 MED ORDER — MIDAZOLAM HCL 10 MG/2ML IJ SOLN
INTRAMUSCULAR | Status: DC | PRN
  Administered 2017-10-16 (×2): 2 mg via INTRAVENOUS
  Administered 2017-10-16: 1 mg via INTRAVENOUS
  Administered 2017-10-16: 2 mg via INTRAVENOUS

## 2017-10-16 MED ORDER — SPOT INK MARKER SYRINGE KIT
PACK | SUBMUCOSAL | Status: AC
  Filled 2017-10-16: qty 5

## 2017-10-16 NOTE — Op Note (Addendum)
[facility] Patient Name: Amanda [name] Procedure Date: 10/16/2017 MRN: [id number] Attending MD: Jeani Hawking , MD Date of Birth: January 05, 1966 CSN: [id number] Age: 52 Admit Type: Inpatient Procedure:                Colonoscopy Indications:              Iron deficiency anemia Providers:                Jeani Hawking, MD, Tomma Rakers, RN, Madalyn Rob, Technician Referring MD:              Medicines:                Fentanyl 100 micrograms IV, Midazolam 7 mg IV,                            Diphenhydramine 25 mg IV Complications:            No immediate complications. Estimated Blood Loss:      Procedure:                Pre-Anesthesia Assessment:                           - Prior to the procedure, a History and Physical                            was performed, and patient medications and                            allergies were reviewed. The patient's tolerance of                            previous anesthesia was also reviewed. The risks                            and benefits of the procedure and the sedation                            options and risks were discussed with the patient.                            All questions were answered, and informed consent                            was obtained. Prior Anticoagulants: The patient has                            taken no previous anticoagulant or antiplatelet                            agents. ASA Grade Assessment: II - A patient with                            mild systemic disease.  After reviewing the risks                            and benefits, the patient was deemed in                            satisfactory condition to undergo the procedure.                           - Sedation was administered by an endoscopy nurse.                            The sedation level attained was moderate.                           After obtaining informed consent, the colonoscope           was passed under direct vision. Throughout the                            procedure, the patient's blood pressure, pulse, and                            oxygen saturations were monitored continuously. The                            EC-3490LI ([id number]) scope was introduced through                            the anus and advanced to the the cecum, identified                            by appendiceal orifice and ileocecal valve. The                            colonoscopy was somewhat difficult due to                            significant looping. Successful completion of the                            procedure was aided by applying abdominal pressure.                            The patient tolerated the procedure well. The                            quality of the bowel preparation was good. The                            ileocecal valve, appendiceal orifice, and rectum                            were photographed. Scope In: 8:27:47 AM Scope Out: 8:51:11 AM Scope Withdrawal Time: 0  hours 14 minutes 29 seconds  Total Procedure Duration: 0 hours 23 minutes 24 seconds  Findings:      The entire examined colon appeared normal. Impression:               - The entire examined colon is normal.                           - No specimens collected. Moderate Sedation:      Moderate (conscious) sedation was administered by the endoscopy nurse       and supervised by the endoscopist. The following parameters were       monitored: oxygen saturation, heart rate, blood pressure, and response       to care. Total physician intraservice time was 35 minutes. Recommendation:           - Return patient to hospital ward for ongoing care.                           - Resume regular diet.                           - Continue present medications.                           - Repeat colonoscopy in 10 years for surveillance. Procedure Code(s):        --- Professional ---                           [phone],  Colonoscopy, flexible; diagnostic, including                            collection of specimen(s) by brushing or washing,                            when performed (separate procedure)                           99153, Moderate sedation services provided by the                            same physician or other qualified health care                            professional performing the diagnostic or                            therapeutic service that the sedation supports,                            requiring the presence of an independent trained                            observer to assist in the monitoring of the                            patient's level of consciousness and physiological  status; each additional 15 minutes intraservice                            time (List separately in addition to code for                            primary service) Diagnosis Code(s):        --- Professional ---                           D50.9, Iron deficiency anemia, unspecified CPT copyright 2017 American Medical Association. All rights reserved. The codes documented in this report are preliminary and upon coder review may  be revised to meet current compliance requirements. Jeani Hawking, MD Jeani Hawking, MD 10/16/2017 8:58:58 AM This report has been signed electronically. Number of Addenda: 0

## 2017-10-16 NOTE — Progress Notes (Signed)
Pt was discharged home today. Instructions were reviewed with patient, and questions were answered. Pt was taken to main entrance via wheelchair by NT.  

## 2017-10-16 NOTE — Interval H&P Note (Signed)
History and Physical Interval Note:  10/16/2017 8:15 AM  Amanda [name]  has presented today for surgery, with the diagnosis of IDA  The various methods of treatment have been discussed with the patient and family. After consideration of risks, benefits and other options for treatment, the patient has consented to  Procedure(s): COLONOSCOPY (N/A) ESOPHAGOGASTRODUODENOSCOPY (EGD) (N/A) as a surgical intervention .  The patient's history has been reviewed, patient examined, no change in status, stable for surgery.  I have reviewed the patient's chart and labs.  Questions were answered to the patient's satisfaction.     Lanina Larranaga D

## 2017-10-16 NOTE — Op Note (Addendum)
[facility] Patient Name: Amanda [name] Procedure Date: 10/16/2017 MRN: [id number] Attending MD: Jeani HawkingPatrick Eliana Lueth , MD Date of Birth: 1965/06/26 CSN: [id number] Age: [age] Admit Type: Inpatient Procedure:                Upper GI endoscopy Indications:              Iron deficiency anemia Providers:                Jeani HawkingPatrick Tharon Bomar, MD, Tomma RakersJennifer Kappus, RN, Madalyn RobMarilyn                            Everhart, Technician Referring MD:              Medicines:                See the colonoscopy report. Complications:            No immediate complications. Estimated Blood Loss:      Procedure:                Pre-Anesthesia Assessment:                           - Prior to the procedure, a History and Physical                            was performed, and patient medications and                            allergies were reviewed. The patient's tolerance of                            previous anesthesia was also reviewed. The risks                            and benefits of the procedure and the sedation                            options and risks were discussed with the patient.                            All questions were answered, and informed consent                            was obtained. Prior Anticoagulants: The patient has                            taken no previous anticoagulant or antiplatelet                            agents. ASA Grade Assessment: II - A patient with                            mild systemic disease. After reviewing the risks                            and benefits,  the patient was deemed in                            satisfactory condition to undergo the procedure.                           - Sedation was administered by an endoscopy nurse.                            The sedation level attained was moderate.                           After obtaining informed consent, the endoscope was                            passed under direct vision. Throughout the                   procedure, the patient's blood pressure, pulse, and                            oxygen saturations were monitored continuously. The                            EC-3490LI ([id number]) scope was introduced through                            the mouth, and advanced to the second part of                            duodenum. The upper GI endoscopy was accomplished                            without difficulty. The patient tolerated the                            procedure well. Scope In: Scope Out: Findings:      A 5 cm hiatal hernia was present.      Multiple dispersed, small non-bleeding erosions were found in the       gastric body and in the gastric antrum. There were no stigmata of recent       bleeding. Biopsies were taken with a cold forceps for histology.      The examined duodenum was normal.      The Z-line was not clearly visible, but there is suspicion of a mild       esophagitis. At the diaphragmatic incursion there was evidence of       Cameron's lesions as well as gastritis/erosions in the antrum. Biopsies       were obtained in the antrum to evaluate for H. pylori. Impression:               - 5 cm hiatal hernia.                           - Non-bleeding erosive gastropathy. Biopsied.                           -  Normal examined duodenum. Moderate Sedation:      Moderate (conscious) sedation was administered by the endoscopy nurse       and supervised by the endoscopist. The following parameters were       monitored: oxygen saturation, heart rate, blood pressure, and response       to care. Total physician intraservice time was 35 minutes. Recommendation:           - Return patient to hospital ward for ongoing care.                           - Resume regular diet.                           - Continue present medications.                           - PPI QD.                           - Okay to D/C home.                           - Follow up with [facility] in 2-4  weeks.                           - Await biopsy results. Procedure Code(s):        --- Professional ---                           [phone], Esophagogastroduodenoscopy, flexible,                            transoral; with biopsy, single or multiple Diagnosis Code(s):        --- Professional ---                           K44.9, Diaphragmatic hernia without obstruction or                            gangrene                           K31.89, Other diseases of stomach and duodenum                           D50.9, Iron deficiency anemia, unspecified CPT copyright 2017 American Medical Association. All rights reserved. The codes documented in this report are preliminary and upon coder review may  be revised to meet current compliance requirements. Jeani Hawking, MD Jeani Hawking, MD 10/16/2017 9:06:01 AM This report has been signed electronically. Number of Addenda: 0

## 2017-10-16 NOTE — Discharge Instructions (Signed)

## 2017-10-16 NOTE — Discharge Summary (Addendum)
Physician Discharge Summary  [name] ZOX:[id number] DOB: 18-Sep-1965 DOA: 10/14/2017  PCP: [name], MD  Admit date: 10/14/2017 Discharge date: 10/16/2017  Admitted From: Home Disposition: Home  Recommendations for Outpatient Follow-up:  1. Follow up with PCP in 1 week 2. Please obtain BMP/CBC in one week 3. Please follow up on the following pending results: EGD biopsy results  Home Health: None Equipment/Devices: None  Discharge Condition: Stable CODE STATUS: Full code Diet recommendation: Regular diet   Brief/Interim Summary:  Admission HPI written by Albertine Grates, MD   HPI: Amanda [name] is a 52 y.o. female   With history of microcytic anemia, currently not taking any medication, is referred from [facility] office for direct admission due to symptomatic anemia.  Patient started feel short of breath and weak, was evaluated by primary care doctor on 6/11and she is anemic hemoglobin 5.7.  Iron <10, Tsats less than 1%, ferritin 1, She had a EKG done on 6/11 which is unremarkable.  She denies any active bleed.  She states she stopped having menses a year ago, she denies blood in the stool.  No hematemesis.  No hematuria.  She denies weight loss. She reports occasional epigastric pain after eating.  No Chest pain, no syncope.  Primary care physician sent her to GI for further evaluation for anemia, she is found to have tachycardia, she is sent to [facility] long for direct admission.    Hospital course:  Symptomatic anemia Patient given 2 units of PRBC for a hemoglobin of 5.8 on admission. Improved to 8.2 today. Chest pain and dyspnea improved. Doubt this is all secondary to iron deficiency. Vitamin B12 normal. EGD significant for hiatal hernia and non-bleeding erosive gastropathy. Colonoscopy unremarkable. Given IV iron prior to discharge. Recommend continued workup for anemia. Consider hematology consult.  Iron deficiency anemia Likely contributed to significant  anemia.  Chest pain Secondary to anemia. EKG normal sinus rhythm from outpatient. Resolved.   Discharge Diagnoses:  Active Problems:   ANEMIA-IRON DEFICIENCY   ESOPHAGITIS, REFLUX   Symptomatic anemia    Discharge Instructions  Discharge Instructions    Call MD for:  difficulty breathing, headache or visual disturbances   Complete by:  As directed    Call MD for:  extreme fatigue   Complete by:  As directed    Call MD for:  temperature >100.4   Complete by:  As directed    Diet - low sodium heart healthy   Complete by:  As directed    Increase activity slowly   Complete by:  As directed      Allergies as of 10/16/2017   No Known Allergies     Medication List    TAKE these medications   b complex vitamins tablet Take 1 tablet by mouth daily.   cetirizine 10 MG tablet Commonly known as:  ZYRTEC Take 10 mg by mouth daily.   pantoprazole 40 MG tablet Commonly known as:  PROTONIX Take 1 tablet (40 mg total) by mouth daily.   Vitamin B-12 1000 MCG Subl Place 1 tablet (1,000 mcg total) under the tongue daily.   zolpidem 5 MG tablet Commonly known as:  AMBIEN Take 0.5-1 tablets (2.5-5 mg total) by mouth at bedtime as needed for sleep.      Follow-up Information    Plotnikov, Georgina Quint, MD. Schedule an appointment as soon as possible for a visit in 1 week(s).   Specialty:  Internal Medicine Why:  Hospital follow-up Contact information: [address] [place] [address] [phone]        Unk LightningLemmon, Jennifer Lynne, PA. Schedule an appointment as soon as possible for a visit in 1 week(s).   Specialty:  Gastroenterology Why:  EGD biopsy results Contact information: [address] [place] [address] [phone]          No Known Allergies  Consultations:  Gastroenterology   Procedures/Studies: Dg Chest 2 View  Result Date: 10/12/2017 CLINICAL DATA:  Substernal chest pain for several months EXAM: CHEST - 2 VIEW COMPARISON:   06/28/2016 FINDINGS: Cardiac shadow is within normal limits. Lungs are clear. No acute bony abnormality is noted. Moderate-sized hiatal hernia is again noted. IMPRESSION: Hiatal hernia stable from the prior exam. No acute abnormality noted. Electronically Signed   By: Alcide CleverMark  Lukens M.D.   On: 10/12/2017 08:07    EGD (6/16)  Impression:   - 5 cm hiatal hernia. - Non-bleeding erosive gastropathy. Biopsied. - Normal examined duodenum.  Recommendation: - Return patient to hospital ward for ongoing care. - Resume regular diet. - Continue present medications. - PPI QD. - Okay to D/C home. - Follow up with [facility] in 2-4 weeks. - Await biopsy results.   Colonoscopy (6/16)  Impression: - The entire examined colon is normal. - No specimens collected.  Recommendation: - Return patient to hospital ward for ongoing care. - Resume regular diet. - Continue present medications. - Repeat colonoscopy in 10 years for surveillance.   Subjective: Feels okay today. No chest pain or dyspnea.  Discharge Exam: Vitals:   10/16/17 0855 10/16/17 0900  BP: (!) 115/58 (!) 112/54  Pulse: 87 88  Resp: 15 16  Temp:    SpO2: 100% 98%   Vitals:   10/16/17 0845 10/16/17 0850 10/16/17 0855 10/16/17 0900  BP: 118/78 122/77 (!) 115/58 (!) 112/54  Pulse: 92 91 87 88  Resp: 17 20 15 16   Temp:  98 F (36.7 C)    TempSrc:  Oral    SpO2: 100% 100% 100% 98%    General: Pt is alert, awake, not in acute distress    The results of significant diagnostics from this hospitalization (including imaging, microbiology, ancillary and laboratory) are listed below for reference.     Microbiology: No results found for this or any previous visit (from the past 240 hour(s)).   Labs: BNP (last 3 results) No results for input(s): BNP in the last 8760 hours. Basic Metabolic Panel: Recent Labs  Lab 10/11/17 1408 10/14/17 1818 10/15/17 0727  NA 140 136 141  K 3.8 3.9 4.3  CL 104 105 108  CO2 27 21*  28  GLUCOSE 103* 208* 114*  BUN 12 18 14   CREATININE 0.84 0.98 0.81  CALCIUM 9.2 9.0 9.2   Liver Function Tests: Recent Labs  Lab 10/11/17 1408 10/14/17 1818  AST 12 34  ALT 11 15  ALKPHOS 60 58  BILITOT 0.4 0.4  PROT 7.5 7.7  ALBUMIN 4.2 4.2   No results for input(s): LIPASE, AMYLASE in the last 168 hours. No results for input(s): AMMONIA in the last 168 hours. CBC: Recent Labs  Lab 10/11/17 1408 10/14/17 1818 10/15/17 0727 10/16/17 0402  WBC 4.4 8.3 7.8 7.3  NEUTROABS 2.7 6.4  --   --   HGB 5.7 Repeated and verified X2.* 5.8* 8.2* 8.7*  HCT 19.7 Repeated and verified X2.* 23.0* 29.1* 30.0*  MCV 54.9 Repeated and verified X2.* 60.5* 66.7* 67.1*  PLT 269.0 390 349 352  Anemia work up Recent Labs    10/14/17 1818 10/15/17 0727  VITAMINB12 257  --   FOLATE 18.3  --   TIBC  --  513*  IRON  --  15*  RETICCTPCT 3.1  --    Urinalysis    Component Value Date/Time   COLORURINE YELLOW 10/11/2017 1408   APPEARANCEUR CLEAR 10/11/2017 1408   LABSPEC 1.015 10/11/2017 1408   PHURINE 7.5 10/11/2017 1408   GLUCOSEU NEGATIVE 10/11/2017 1408   HGBUR NEGATIVE 10/11/2017 1408   BILIRUBINUR NEGATIVE 10/11/2017 1408   KETONESUR NEGATIVE 10/11/2017 1408   UROBILINOGEN 0.2 10/11/2017 1408   NITRITE NEGATIVE 10/11/2017 1408   LEUKOCYTESUR NEGATIVE 10/11/2017 1408     SIGNED:   Jacquelin Hawking, MD [facility] 10/16/2017, 11:26 AM

## 2017-10-17 ENCOUNTER — Ambulatory Visit: Payer: Managed Care, Other (non HMO) | Admitting: Internal Medicine

## 2017-10-17 ENCOUNTER — Encounter (HOSPITAL_COMMUNITY): Payer: Self-pay | Admitting: Gastroenterology

## 2017-10-17 LAB — TYPE AND SCREEN
ABO/RH(D): A NEG
Antibody Screen: NEGATIVE
Unit division: 0
Unit division: 0

## 2017-10-17 LAB — BPAM RBC
Blood Product Expiration Date: 201907042359
Blood Product Expiration Date: 201907062359
ISSUE DATE / TIME: 201906142119
ISSUE DATE / TIME: 201906150202
Unit Type and Rh: 600
Unit Type and Rh: 600

## 2017-10-18 ENCOUNTER — Telehealth: Payer: Self-pay

## 2017-10-18 NOTE — Telephone Encounter (Signed)
Pt is on TCM Report after admission per [facility] for symptomatic anemia.   Pt to follow up with PCP and GI  Called patient and left a message.

## 2017-10-19 ENCOUNTER — Other Ambulatory Visit: Payer: Self-pay | Admitting: Physician Assistant

## 2017-10-19 NOTE — Progress Notes (Signed)
10/19/2017-  Pathology results from EGD 10/16/17-Dr. Elnoria HowardHung (covering for our service over the weekend)-showed benign gastric mucosa in the stomach with no intestinal metaplasia or malignancy.  No change to current recommendations.  Amanda [name] lemon, PA-C 10/19/2017 1025

## 2017-10-24 NOTE — Progress Notes (Signed)
Assessment and plans reviewed. Subsequent hospitalization reviewed. Patient's colonoscopy was normal. Upper endoscopy revealed Cameron erosions to account for chronic iron deficiency anemia

## 2017-10-25 ENCOUNTER — Encounter: Payer: Self-pay | Admitting: Internal Medicine

## 2017-10-25 ENCOUNTER — Ambulatory Visit: Payer: BLUE CROSS/BLUE SHIELD | Admitting: Internal Medicine

## 2017-10-25 DIAGNOSIS — M797 Fibromyalgia: Secondary | ICD-10-CM

## 2017-10-25 DIAGNOSIS — F329 Major depressive disorder, single episode, unspecified: Secondary | ICD-10-CM

## 2017-10-25 DIAGNOSIS — R079 Chest pain, unspecified: Secondary | ICD-10-CM

## 2017-10-25 DIAGNOSIS — R5382 Chronic fatigue, unspecified: Secondary | ICD-10-CM | POA: Diagnosis not present

## 2017-10-25 DIAGNOSIS — E538 Deficiency of other specified B group vitamins: Secondary | ICD-10-CM

## 2017-10-25 DIAGNOSIS — E559 Vitamin D deficiency, unspecified: Secondary | ICD-10-CM | POA: Diagnosis not present

## 2017-10-25 MED ORDER — VITAMIN D3 50 MCG (2000 UT) PO CAPS: 2000.0000 [IU] | ORAL_CAPSULE | Freq: Every day | ORAL | 3 refills | Status: AC

## 2017-10-25 MED ORDER — TRAMADOL HCL 50 MG PO TABS: 50.0000 mg | ORAL_TABLET | Freq: Four times a day (QID) | ORAL | 0 refills | Status: DC | PRN

## 2017-10-25 MED ORDER — CYANOCOBALAMIN 1000 MCG/ML IJ SOLN
1000.0000 ug | Freq: Once | INTRAMUSCULAR | Status: AC
  Administered 2017-10-25: 1000 ug via INTRAMUSCULAR

## 2017-10-25 NOTE — Addendum Note (Signed)
Addended by: Scarlett PrestoFRIEDENBACH, Shene Maxfield on: 10/25/2017 04:40 PM   Modules accepted: Orders

## 2017-10-25 NOTE — Assessment & Plan Note (Signed)
Tramadol prn ° Potential benefits of a long term opioids use as well as potential risks (i.e. addiction risk, apnea etc) and complications (i.e. Somnolence, constipation and others) were explained to the patient and were aknowledged. ° ° °

## 2017-10-25 NOTE — Assessment & Plan Note (Signed)
On B12 

## 2017-10-25 NOTE — Assessment & Plan Note (Signed)
Better w/anemia Rx

## 2017-10-25 NOTE — Progress Notes (Signed)
Subjective:  Patient ID: Amanda [name], female    DOB: [birth year]  Age: 52 y.o. MRN: [id number]  CC: No chief complaint on file.   HPI Amanda [name] presents for severe anemia,FMS, GERD f/u Feeling much better after IV iron/RBC  Outpatient Medications Prior to Visit  Medication Sig Dispense Refill  . b complex vitamins tablet Take 1 tablet by mouth daily. 100 tablet 3  . cetirizine (ZYRTEC) 10 MG tablet Take 10 mg by mouth daily.    . Cyanocobalamin (VITAMIN B-12) 1000 MCG SUBL Place 1 tablet (1,000 mcg total) under the tongue daily. 100 tablet 3  . pantoprazole (PROTONIX) 40 MG tablet Take 1 tablet (40 mg total) by mouth daily. 30 tablet 0  . zolpidem (AMBIEN) 5 MG tablet Take 0.5-1 tablets (2.5-5 mg total) by mouth at bedtime as needed for sleep. 30 tablet 1   No facility-administered medications prior to visit.     ROS: Review of Systems  Constitutional: Positive for fatigue. Negative for activity change, appetite change, chills and unexpected weight change.  HENT: Negative for congestion, mouth sores and sinus pressure.   Eyes: Negative for visual disturbance.  Respiratory: Negative for cough and chest tightness.   Cardiovascular: Negative for chest pain.  Gastrointestinal: Negative for abdominal pain and nausea.  Genitourinary: Negative for difficulty urinating, frequency and vaginal pain.  Musculoskeletal: Negative for arthralgias, back pain, gait problem and neck stiffness.  Skin: Negative for pallor and rash.  Neurological: Negative for dizziness, tremors, weakness, numbness and headaches.  Psychiatric/Behavioral: Negative for confusion, sleep disturbance and suicidal ideas.    Objective:  BP 116/82 (BP Location: Left Arm, Patient Position: Sitting, Cuff Size: Large)   Pulse 77   Temp 98.9 F (37.2 C) (Oral)   Ht 5\' 2"  (1.575 m)   Wt 212 lb (96.2 kg)   SpO2 99%   BMI 38.78 kg/m   BP Readings from Last 3 Encounters:  10/25/17 116/82  10/16/17 (!) 112/54    10/14/17 110/70    Wt Readings from Last 3 Encounters:  10/25/17 212 lb (96.2 kg)  10/14/17 209 lb (94.8 kg)  10/11/17 211 lb (95.7 kg)    Physical Exam  Constitutional: She appears well-developed. No distress.  HENT:  Head: Normocephalic.  Right Ear: External ear normal.  Left Ear: External ear normal.  Nose: Nose normal.  Mouth/Throat: Oropharynx is clear and moist.  Eyes: Pupils are equal, round, and reactive to light. Conjunctivae are normal. Right eye exhibits no discharge. Left eye exhibits no discharge.  Neck: Normal range of motion. Neck supple. No JVD present. No tracheal deviation present. No thyromegaly present.  Cardiovascular: Normal rate, regular rhythm and normal heart sounds.  Pulmonary/Chest: No stridor. No respiratory distress. She has no wheezes.  Abdominal: Soft. Bowel sounds are normal. She exhibits no distension and no mass. There is no tenderness. There is no rebound and no guarding.  Musculoskeletal: She exhibits no edema or tenderness.  Lymphadenopathy:    She has no cervical adenopathy.  Neurological: She displays normal reflexes. No cranial nerve deficit. She exhibits normal muscle tone. Coordination normal.  Skin: No rash noted. No erythema.  Psychiatric: She has a normal mood and affect. Her behavior is normal. Judgment and thought content normal.  not pale Animated Not depressed  Lab Results  Component Value Date   WBC 7.3 10/16/2017   HGB 8.7 (L) 10/16/2017   HCT 30.0 (L) 10/16/2017   PLT 352 10/16/2017   GLUCOSE 114 (H) 10/15/2017   ALT 15  10/14/2017   AST 34 10/14/2017   NA 141 10/15/2017   K 4.3 10/15/2017   CL 108 10/15/2017   CREATININE 0.81 10/15/2017   BUN 14 10/15/2017   CO2 28 10/15/2017   TSH 1.64 10/11/2017   INR 1.03 10/14/2017    No results found.  Assessment & Plan:   There are no diagnoses linked to this encounter.   No orders of the defined types were placed in this encounter.    Follow-up: No follow-ups on  file.  Sonda PrimesAlex Dilraj Killgore, MD

## 2017-10-25 NOTE — Assessment & Plan Note (Signed)
Resolved after IV iron/RBC

## 2017-10-25 NOTE — Assessment & Plan Note (Signed)
Vit D 

## 2017-10-25 NOTE — Assessment & Plan Note (Signed)
Resolving w/anemia Rx

## 2017-11-25 ENCOUNTER — Ambulatory Visit: Payer: BLUE CROSS/BLUE SHIELD | Admitting: Family

## 2017-11-25 ENCOUNTER — Other Ambulatory Visit: Payer: BLUE CROSS/BLUE SHIELD

## 2017-12-01 ENCOUNTER — Inpatient Hospital Stay (HOSPITAL_BASED_OUTPATIENT_CLINIC_OR_DEPARTMENT_OTHER): Payer: BLUE CROSS/BLUE SHIELD | Admitting: Hematology & Oncology

## 2017-12-01 ENCOUNTER — Encounter: Payer: Self-pay | Admitting: Hematology & Oncology

## 2017-12-01 ENCOUNTER — Inpatient Hospital Stay: Payer: BLUE CROSS/BLUE SHIELD | Attending: Hematology & Oncology

## 2017-12-01 ENCOUNTER — Other Ambulatory Visit: Payer: Self-pay

## 2017-12-01 VITALS — BP 125/81 | HR 82 | Temp 98.3°F | Resp 16 | Wt 211.0 lb

## 2017-12-01 DIAGNOSIS — D500 Iron deficiency anemia secondary to blood loss (chronic): Secondary | ICD-10-CM

## 2017-12-01 DIAGNOSIS — K2960 Other gastritis without bleeding: Secondary | ICD-10-CM

## 2017-12-01 DIAGNOSIS — D508 Other iron deficiency anemias: Secondary | ICD-10-CM | POA: Insufficient documentation

## 2017-12-01 DIAGNOSIS — N921 Excessive and frequent menstruation with irregular cycle: Secondary | ICD-10-CM

## 2017-12-01 DIAGNOSIS — E538 Deficiency of other specified B group vitamins: Secondary | ICD-10-CM

## 2017-12-01 DIAGNOSIS — K449 Diaphragmatic hernia without obstruction or gangrene: Secondary | ICD-10-CM

## 2017-12-01 LAB — COMPREHENSIVE METABOLIC PANEL
ALT: 17 U/L (ref 0–44)
AST: 20 U/L (ref 15–41)
Albumin: 4.2 g/dL (ref 3.5–5.0)
Alkaline Phosphatase: 58 U/L (ref 38–126)
Anion gap: 8 (ref 5–15)
BUN: 13 mg/dL (ref 6–20)
CO2: 27 mmol/L (ref 22–32)
Calcium: 9.4 mg/dL (ref 8.9–10.3)
Chloride: 108 mmol/L (ref 98–111)
Creatinine, Ser: 0.82 mg/dL (ref 0.44–1.00)
GFR calc Af Amer: >60 mL/min (ref >60)
GFR calc non Af Amer: >60 mL/min (ref >60)
Glucose, Bld: 97 mg/dL (ref 70–99)
Potassium: 4.1 mmol/L (ref 3.5–5.1)
Sodium: 143 mmol/L (ref 135–145)
Total Bilirubin: 0.1 mg/dL — ABNORMAL LOW (ref 0.3–1.2)
Total Protein: 7.9 g/dL (ref 6.5–8.1)

## 2017-12-01 LAB — RETICULOCYTES
RBC.: 4.89 MIL/uL (ref 3.70–5.45)
Retic Count, Absolute: 34.2 10*3/uL (ref 33.7–90.7)
Retic Ct Pct: 0.7 % (ref 0.7–2.1)

## 2017-12-01 LAB — CBC WITH DIFFERENTIAL (CANCER CENTER ONLY)
Basophils Absolute: 0 10*3/uL (ref 0.0–0.1)
Basophils Relative: 1 %
Eosinophils Absolute: 0.2 10*3/uL (ref 0.0–0.5)
Eosinophils Relative: 6 %
HCT: 37.8 % (ref 34.8–46.6)
Hemoglobin: 11.8 g/dL (ref 11.6–15.9)
Lymphocytes Relative: 33 %
Lymphs Abs: 1.3 10*3/uL (ref 0.9–3.3)
MCH: 24 pg — ABNORMAL LOW (ref 26.0–34.0)
MCHC: 31.2 g/dL — ABNORMAL LOW (ref 32.0–36.0)
MCV: 76.8 fL — ABNORMAL LOW (ref 81.0–101.0)
Monocytes Absolute: 0.4 10*3/uL (ref 0.1–0.9)
Monocytes Relative: 10 %
Neutro Abs: 2.1 10*3/uL (ref 1.5–6.5)
Neutrophils Relative %: 50 %
Platelet Count: 265 10*3/uL (ref 145–400)
RBC: 4.92 MIL/uL (ref 3.70–5.32)
RDW: 23.6 % — ABNORMAL HIGH (ref 11.1–15.7)
WBC Count: 4.1 10*3/uL (ref 3.9–10.0)

## 2017-12-01 LAB — SAVE SMEAR

## 2017-12-01 NOTE — Progress Notes (Signed)
Referral MD  Reason for Referral: Iron deficiency anemia  Chief Complaint  Patient presents with  . New Patient (Initial Visit)  : My blood was very low.  HPI: Ms. Eustice is a very nice 52 year old [place] female.  She actually is a sister of 1 of my patients.  She has been in the [place] for over 20 years.  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  She has had heavy monthly cycles.  She began to feel symptomatic back in June.  She was very tired and weak.  She had shortness of breath with minimal exertion.  She was seen by Dr. Alain Marion.  He found that she had a hemoglobin of 5.7.  Her ferritin was 1.  Her iron saturation was 1%.  She was admitted to the hospital.  She received 2 units of blood.  She also got some IV iron.  She was seen by gastroenterology.  She underwent upper endoscopy which showed a hiatal hernia and some nonbleeding erosive gastritis.  The colonoscopy that she had was unremarkable.  She was hospitalized for total of 4 days.  She said that she had been taking some aspirin.  She does think that she took all that much.  She has felt better since she was hospitalized.  She has had more energy.  She does not smoke.  She really does not drink alcohol.  She is had no melena or bright red blood per rectum.   She has had menometrorrhagia.  Her sister has myelofibrosis.  Outside of that, there is no other history of blood problems in the family.  Overall, her performance status is ECOG 0.     Past Medical History:  Diagnosis Date  . Anemia 2019   iron def  . Anxiety   . Depression   . GERD (gastroesophageal reflux disease)   :  Past Surgical History:  Procedure Laterality Date  . BIOPSY  10/16/2017   Procedure: BIOPSY;  Surgeon: Carol Ada, MD;  Location: [facility];  Service: Endoscopy;;  . COLONOSCOPY N/A 10/16/2017   Procedure: COLONOSCOPY;  Surgeon: Carol Ada, MD;  Location: [facility];  Service: Endoscopy;  Laterality: N/A;  .  ESOPHAGOGASTRODUODENOSCOPY N/A 10/16/2017   Procedure: ESOPHAGOGASTRODUODENOSCOPY (EGD);  Surgeon: Carol Ada, MD;  Location: [facility];  Service: Endoscopy;  Laterality: N/A;  :   Current Outpatient Medications:  .  b complex vitamins tablet, Take 1 tablet by mouth daily., Disp: 100 tablet, Rfl: 3 .  cetirizine (ZYRTEC) 10 MG tablet, Take 10 mg by mouth daily., Disp: , Rfl:  .  Cholecalciferol (VITAMIN D3) 2000 units capsule, Take 1 capsule (2,000 Units total) by mouth daily., Disp: 100 capsule, Rfl: 3 .  Cyanocobalamin (VITAMIN B-12) 1000 MCG SUBL, Place 1 tablet (1,000 mcg total) under the tongue daily., Disp: 100 tablet, Rfl: 3 .  pantoprazole (PROTONIX) 40 MG tablet, Take 1 tablet (40 mg total) by mouth daily., Disp: 30 tablet, Rfl: 0 .  traMADol (ULTRAM) 50 MG tablet, Take 1 tablet (50 mg total) by mouth every 6 (six) hours as needed for severe pain., Disp: 20 tablet, Rfl: 0 .  zolpidem (AMBIEN) 5 MG tablet, Take 0.5-1 tablets (2.5-5 mg total) by mouth at bedtime as needed for sleep., Disp: 30 tablet, Rfl: 1:  :  No Known Allergies:  Family History  Problem Relation Age of Onset  . Diabetes Mother 16  . Hypertension Mother   . Parkinson's disease Father   . Asthma Father   . Cancer Sister   .  Heart Problems Maternal Grandmother   . Asthma Maternal Grandfather   :  Social History   Socioeconomic History  . Marital status: Married    Spouse name: Not on file  . Number of children: Not on file  . Years of education: Not on file  . Highest education level: Not on file  Occupational History  . Not on file  Social Needs  . Financial resource strain: Not on file  . Food insecurity:    Worry: Not on file    Inability: Not on file  . Transportation needs:    Medical: Not on file    Non-medical: Not on file  Tobacco Use  . Smoking status: Never Smoker  . Smokeless tobacco: Never Used  Substance and Sexual Activity  . Alcohol use: No  . Drug use: No  . Sexual  activity: Not on file  Lifestyle  . Physical activity:    Days per week: Not on file    Minutes per session: Not on file  . Stress: Not on file  Relationships  . Social connections:    Talks on phone: Not on file    Gets together: Not on file    Attends religious service: Not on file    Active member of club or organization: Not on file    Attends meetings of clubs or organizations: Not on file    Relationship status: Not on file  . Intimate partner violence:    Fear of current or ex partner: Not on file    Emotionally abused: Not on file    Physically abused: Not on file    Forced sexual activity: Not on file  Other Topics Concern  . Not on file  Social History Narrative  . Not on file  :  Review of Systems  Constitutional: Negative.   HENT: Negative.   Eyes: Negative.   Respiratory: Negative.   Cardiovascular: Negative.   Gastrointestinal: Negative.   Genitourinary: Negative.   Musculoskeletal: Positive for myalgias.  Skin: Negative.   Neurological: Positive for sensory change.  Endo/Heme/Allergies: Negative.   Psychiatric/Behavioral: Negative.      Exam: Well-developed and well-nourished white female in no obvious distress.  Vital signs show temperature of 98.3.  Pulse 82.  Blood pressure 125/81.  Weight is 212 pounds.  Head neck exam shows no ocular or oral lesions.  She has pink conjunctiva.  She has no scleral icterus.  She has no adenopathy in the neck.  Thyroid is nonpalpable.  Lungs are clear bilaterally.  Cardiac exam regular rate and rhythm with a normal S1 and S2.  There are no murmurs, rubs or bruits.  Abdomen is soft.  She has good bowel sounds.  There is no fluid wave.  There is no palpable liver or spleen tip.I back exam shows no tenderness over the spine, ribs or hips.  Extremities shows no clubbing, cyanosis or edema.  She has good range of motion of her joints.  Skin exam shows no rashes, ecchymoses or petechia.  Neurological exam shows no focal  neurological deficits. @IPVITALS @   Recent Labs    12/01/17 1400  WBC 4.1  HGB 11.8  HCT 37.8  PLT 265   No results for input(s): NA, K, CL, CO2, GLUCOSE, BUN, CREATININE, CALCIUM in the last 72 hours.  Blood smear review: Normochromic and normocytic population of red blood cells.  There might be some slight microcytic red blood cell population.  I see no target cells.  She has no rouleaux  formation.  There are no nucleated red blood cells.  I see no teardrop cells.  She has no inclusion bodies.  White blood cells appear normal in morphology maturation.  She has no immature myeloid or lymphoid forms.  There are no hypersegmented polys.  Platelets are adequate in number and size.  Pathology: None    Assessment and Plan: Ms. Gane is a very nice 52 year old white female.  She is originally from [place].  She had marked iron deficiency.  This obviously is getting much better.  Being hospitalized and getting the blood in the IV iron really did a nice job for her.  Her MCV is still on the low side.  However, it is slowly coming up.  We will see what her iron studies show.  She may need some more IV iron.  If she does, we can certainly do this in the office.  I do not see a need for a bone marrow biopsy.  I do not see need for any radiographic studies.  Her graph she had an upper and lower endoscopy so this was adequate and did not show any malignancy.  I will plan to get her back to see [place] in another 3 months.  We will get her back sooner if her iron studies are low.  Her graph I spent a good 45 minutes with she and her sister.  All the time was spent face-to-face.  I counseled her and coordinated care for her.  I answered all of her questions.

## 2017-12-02 ENCOUNTER — Telehealth: Payer: Self-pay | Admitting: *Deleted

## 2017-12-02 LAB — COMPREHENSIVE METABOLIC PANEL
ALT: 17 U/L (ref 0–44)
AST: 20 U/L (ref 15–41)
Albumin: 4.2 g/dL (ref 3.5–5.0)
Alkaline Phosphatase: 58 U/L (ref 38–126)
Anion gap: 8 (ref 5–15)
BUN: 13 mg/dL (ref 6–20)
CO2: 27 mmol/L (ref 22–32)
Calcium: 9.4 mg/dL (ref 8.9–10.3)
Chloride: 108 mmol/L (ref 98–111)
Creatinine, Ser: 0.82 mg/dL (ref 0.44–1.00)
GFR calc Af Amer: >60 mL/min (ref >60)
GFR calc non Af Amer: >60 mL/min (ref >60)
Glucose, Bld: 97 mg/dL (ref 70–99)
Potassium: 4.1 mmol/L (ref 3.5–5.1)
Sodium: 143 mmol/L (ref 135–145)
Total Bilirubin: 0.1 mg/dL — ABNORMAL LOW (ref 0.3–1.2)
Total Protein: 7.9 g/dL (ref 6.5–8.1)

## 2017-12-02 LAB — FERRITIN: Ferritin: 8 ng/mL — ABNORMAL LOW (ref 11–307)

## 2017-12-02 LAB — IRON AND TIBC
Iron: 30 ug/dL — ABNORMAL LOW (ref 41–142)
Saturation Ratios: 7 % — ABNORMAL LOW (ref 21–57)
TIBC: 431 ug/dL (ref 236–444)
UIBC: 401 ug/dL

## 2017-12-02 LAB — VITAMIN B12: Vitamin B-12: 236 pg/mL (ref 180–914)

## 2017-12-02 LAB — ERYTHROPOIETIN: Erythropoietin: 13 m[IU]/mL (ref 2.6–18.5)

## 2017-12-02 NOTE — Telephone Encounter (Addendum)
  Patient is aware of results. Appointments made  ----- Message from Josph MachoPeter R Ennever, MD sent at 12/02/2017  1:20 PM EDT ----- Call - the iron is still very low!!! Needs 2 doses of Feraheme.  Amanda [name]

## 2017-12-09 ENCOUNTER — Inpatient Hospital Stay: Payer: BLUE CROSS/BLUE SHIELD

## 2017-12-09 VITALS — BP 114/76 | HR 85 | Temp 98.0°F | Resp 17

## 2017-12-09 DIAGNOSIS — D508 Other iron deficiency anemias: Secondary | ICD-10-CM | POA: Diagnosis not present

## 2017-12-09 DIAGNOSIS — N921 Excessive and frequent menstruation with irregular cycle: Secondary | ICD-10-CM | POA: Diagnosis not present

## 2017-12-09 DIAGNOSIS — D500 Iron deficiency anemia secondary to blood loss (chronic): Secondary | ICD-10-CM

## 2017-12-09 DIAGNOSIS — K2960 Other gastritis without bleeding: Secondary | ICD-10-CM | POA: Diagnosis not present

## 2017-12-09 DIAGNOSIS — K449 Diaphragmatic hernia without obstruction or gangrene: Secondary | ICD-10-CM | POA: Diagnosis not present

## 2017-12-09 MED ORDER — SODIUM CHLORIDE 0.9 % IV SOLN
510.0000 mg | Freq: Once | INTRAVENOUS | Status: AC
  Administered 2017-12-09: 510 mg via INTRAVENOUS
  Filled 2017-12-09: qty 17

## 2017-12-09 MED ORDER — SODIUM CHLORIDE 0.9 % IV SOLN
Freq: Once | INTRAVENOUS | Status: AC
  Administered 2017-12-09: 14:00:00 via INTRAVENOUS
  Filled 2017-12-09: qty 250

## 2017-12-09 NOTE — Patient Instructions (Signed)

## 2017-12-12 ENCOUNTER — Inpatient Hospital Stay: Payer: BLUE CROSS/BLUE SHIELD

## 2017-12-16 ENCOUNTER — Other Ambulatory Visit: Payer: Self-pay | Admitting: Family

## 2017-12-16 ENCOUNTER — Inpatient Hospital Stay: Payer: BLUE CROSS/BLUE SHIELD

## 2017-12-16 VITALS — BP 123/72 | HR 83 | Temp 97.9°F | Resp 18

## 2017-12-16 DIAGNOSIS — K2960 Other gastritis without bleeding: Secondary | ICD-10-CM | POA: Diagnosis not present

## 2017-12-16 DIAGNOSIS — K449 Diaphragmatic hernia without obstruction or gangrene: Secondary | ICD-10-CM | POA: Diagnosis not present

## 2017-12-16 DIAGNOSIS — N921 Excessive and frequent menstruation with irregular cycle: Secondary | ICD-10-CM | POA: Diagnosis not present

## 2017-12-16 DIAGNOSIS — D508 Other iron deficiency anemias: Secondary | ICD-10-CM | POA: Diagnosis not present

## 2017-12-16 DIAGNOSIS — D500 Iron deficiency anemia secondary to blood loss (chronic): Secondary | ICD-10-CM

## 2017-12-16 MED ORDER — SODIUM CHLORIDE 0.9 % IV SOLN
510.0000 mg | Freq: Once | INTRAVENOUS | Status: AC
  Administered 2017-12-16: 510 mg via INTRAVENOUS
  Filled 2017-12-16: qty 17

## 2017-12-16 MED ORDER — SODIUM CHLORIDE 0.9 % IV SOLN
Freq: Once | INTRAVENOUS | Status: AC
  Administered 2017-12-16: 14:00:00 via INTRAVENOUS
  Filled 2017-12-16: qty 250

## 2017-12-28 ENCOUNTER — Other Ambulatory Visit: Payer: Self-pay | Admitting: Internal Medicine

## 2017-12-29 NOTE — Telephone Encounter (Signed)
Please advise about refill in Dr. Plotnikovs absence. 

## 2018-01-24 ENCOUNTER — Ambulatory Visit: Payer: BLUE CROSS/BLUE SHIELD | Admitting: Internal Medicine

## 2018-02-27 ENCOUNTER — Encounter: Payer: Self-pay | Admitting: Family

## 2018-02-27 ENCOUNTER — Ambulatory Visit: Payer: BLUE CROSS/BLUE SHIELD | Admitting: Family

## 2018-02-27 ENCOUNTER — Telehealth: Payer: Self-pay | Admitting: Hematology & Oncology

## 2018-02-27 ENCOUNTER — Other Ambulatory Visit: Payer: Self-pay

## 2018-02-27 ENCOUNTER — Ambulatory Visit: Payer: Self-pay

## 2018-02-27 ENCOUNTER — Ambulatory Visit (INDEPENDENT_AMBULATORY_CARE_PROVIDER_SITE_OTHER)
Admission: RE | Admit: 2018-02-27 | Discharge: 2018-02-27 | Disposition: A | Discharge status: Home / Self Care | Payer: BLUE CROSS/BLUE SHIELD | Source: Ambulatory Visit | Attending: Family | Admitting: Family

## 2018-02-27 VITALS — BP 128/90 | HR 93 | Temp 98.2°F | Ht 62.0 in | Wt 208.1 lb

## 2018-02-27 DIAGNOSIS — M542 Cervicalgia: Secondary | ICD-10-CM

## 2018-02-27 DIAGNOSIS — R519 Headache, unspecified: Secondary | ICD-10-CM

## 2018-02-27 DIAGNOSIS — R51 Headache: Secondary | ICD-10-CM | POA: Diagnosis not present

## 2018-02-27 MED ORDER — METHYLPREDNISOLONE 4 MG PO TBPK: ORAL_TABLET | ORAL | 0 refills | Status: DC

## 2018-02-27 MED ORDER — METHOCARBAMOL 500 MG PO TABS
500.0000 mg | ORAL_TABLET | Freq: Every evening | ORAL | 0 refills | Status: DC | PRN
Start: 2018-02-27 — End: 2018-05-30

## 2018-02-27 NOTE — Progress Notes (Signed)
Amanda [name] is a 52 y.o. female with the following history as recorded in Epic[employer]:  Patient Active Problem List   Diagnosis Date Noted  . Symptomatic anemia 10/14/2017  . B12 deficiency 08/09/2016  . Vitamin D deficiency 08/09/2016  . Back pain 06/28/2016  . Meralgia paresthetica 06/28/2016  . Upper respiratory infection 06/28/2016  . Grief reaction 06/28/2016  . Fatigue 01/20/2009  . PARESTHESIA 01/20/2009  . CHEST PAIN 04/29/2008  . ANEMIA-IRON DEFICIENCY 08/09/2007  . Anxiety state 08/09/2007  . Depression, reactive 07/28/2007  . ESOPHAGITIS, REFLUX 07/28/2007  . GERD 07/28/2007  . GASTRITIS, ACUTE 07/28/2007  . HIATAL HERNIA 07/28/2007  . Fibromyalgia syndrome 07/28/2007  . GASTROINTESTINAL HEMORRHAGE, HX OF 07/28/2007  . HEADACHES, HX OF 07/28/2007    Current Outpatient Medications  Medication Sig Dispense Refill  . b complex vitamins tablet Take 1 tablet by mouth daily. 100 tablet 3  . cetirizine (ZYRTEC) 10 MG tablet Take 10 mg by mouth daily.    . Cholecalciferol (VITAMIN D3) 2000 units capsule Take 1 capsule (2,000 Units total) by mouth daily. 100 capsule 3  . Cyanocobalamin (VITAMIN B-12) 1000 MCG SUBL Place 1 tablet (1,000 mcg total) under the tongue daily. 100 tablet 3  . pantoprazole (PROTONIX) 40 MG tablet Take 1 tablet (40 mg total) by mouth daily. 30 tablet 0  . traMADol (ULTRAM) 50 MG tablet TAKE 1 TABLET BY MOUTH EVERY 6 HOURS AS NEEDED FOR SEVERE PAIN 20 tablet 0  . zolpidem (AMBIEN) 5 MG tablet Take 0.5-1 tablets (2.5-5 mg total) by mouth at bedtime as needed for sleep. 30 tablet 1  . methocarbamol (ROBAXIN) 500 MG tablet Take 1 tablet (500 mg total) by mouth at bedtime as needed. 30 tablet 0  . methylPREDNISolone (MEDROL DOSEPAK) 4 MG TBPK tablet Taper as directed 21 tablet 0   No current facility-administered medications for this visit.     Allergies: Patient has no known allergies.  Past Medical History:  Diagnosis Date  . Anemia 2019   iron def   . Anxiety   . Depression   . GERD (gastroesophageal reflux disease)     Past Surgical History:  Procedure Laterality Date  . BIOPSY  10/16/2017   Procedure: BIOPSY;  Surgeon: Jeani Hawking, MD;  Location: [facility];  Service: Endoscopy;;  . COLONOSCOPY N/A 10/16/2017   Procedure: COLONOSCOPY;  Surgeon: Jeani Hawking, MD;  Location: [facility];  Service: Endoscopy;  Laterality: N/A;  . ESOPHAGOGASTRODUODENOSCOPY N/A 10/16/2017   Procedure: ESOPHAGOGASTRODUODENOSCOPY (EGD);  Surgeon: Jeani Hawking, MD;  Location: [facility];  Service: Endoscopy;  Laterality: N/A;    Family History  Problem Relation Age of Onset  . Diabetes Mother 25  . Hypertension Mother   . Parkinson's disease Father   . Asthma Father   . Cancer Sister   . Heart Problems Maternal Grandmother   . Asthma Maternal Grandfather     Social History   Tobacco Use  . Smoking status: Never Smoker  . Smokeless tobacco: Never Used  Substance Use Topics  . 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    Objective:  Vitals:   02/27/18 1358  BP: 128/90  Pulse: 93  Temp: 98.2 F (36.8 C)  TempSrc: Oral  SpO2: 98%  Weight: 208 lb 1.3 oz (94.4 kg)  Height: 5\' 2"  (1.575 m)    General: Well developed, well nourished, in no acute distress  Skin : Warm and dry.  Head:  Normocephalic and atraumatic  Eyes: Sclera and conjunctiva clear; pupils round and reactive to light; extraocular movements intact  Ears: External normal; canals clear; tympanic membranes normal  Oropharynx: Pink, supple. No suspicious lesions  Neck: Supple without thyromegaly, adenopathy  Lungs: Respirations unlabored; clear to auscultation bilaterally without wheeze, rales, rhonchi  CVS exam: normal rate and regular rhythm.  Musculoskeletal: No deformities; no active joint inflammation  Extremities: No edema, cyanosis, clubbing  Vessels: Symmetric bilaterally  Neurologic: Alert and oriented; speech intact; face symmetrical; moves all extremities well; CNII-XII intact without focal deficit  Assessment:  1. Neck pain   2. Headache in back of head     Plan:  Suspect muscular source; physical exam is reassuring; update cervical X-ray; Rx for Medrol Dose pak and Robaxin; apply heat, rest- work note given as requested; follow-up to be determined.    No follow-ups on file.  Orders Placed This Encounter  Procedures  . DG Cervical Spine 2 or 3 views    Standing Status:   Future    Number of Occurrences:   1    Standing Expiration Date:   04/30/2019    Order Specific Question:   Reason for Exam (SYMPTOM  OR DIAGNOSIS REQUIRED)    Answer:   neck pain    Order Specific Question:   Is patient pregnant?    Answer:   No    Order Specific Question:   Preferred imaging location?    Answer:   [facility]    Order Specific Question:   Radiology Contrast Protocol - do NOT remove file path    Answer:   [email or web address]    Requested Prescriptions   Signed Prescriptions Disp Refills  . methylPREDNISolone (MEDROL DOSEPAK) 4 MG TBPK tablet 21 tablet 0    Sig: Taper as directed  . methocarbamol (ROBAXIN) 500 MG tablet 30 tablet 0    Sig: Take 1 tablet (500 mg total) by mouth at bedtime as needed.

## 2018-02-27 NOTE — Telephone Encounter (Signed)
Pt. Reports she started having a headache, on and off x 1 week. Ibuprofen does not help. Head hurts at the back of her head near her neck. Reports some nausea and dizziness. States she had "this a long time ago and it just went away without treatment." Reports her BP "has been high for me - 154/83". Appointment made for today. No availability with Dr. Posey Rea. Instructed if headache worsens before appointment to call back. Verbalizes understanding.  Reason for Disposition . [1] MODERATE headache (e.g., interferes with normal activities) AND [2] present > 24 hours AND [3] unexplained  (Exceptions: analgesics not tried, typical migraine, or headache part of viral illness)  Answer Assessment - Initial Assessment Questions 1. LOCATION: "Where does it hurt?"      Back near her neck 2. ONSET: "When did the headache start?" (Minutes, hours or days)      Started 1 week ago 3. PATTERN: "Does the pain come and go, or has it been constant since it started?"     Comes and goes 4. SEVERITY: "How bad is the pain?" and "What does it keep you from doing?"  (e.g., Scale 1-10; mild, moderate, or severe)   - MILD (1-3): doesn't interfere with normal activities    - MODERATE (4-7): interferes with normal activities or awakens from sleep    - SEVERE (8-10): excruciating pain, unable to do any normal activities        1-3 5. RECURRENT SYMPTOM: "Have you ever had headaches before?" If so, ask: "When was the last time?" and "What happened that time?"      Yes 6. CAUSE: "What do you think is causing the headache?"     Unsure 7. MIGRAINE: "Have you been diagnosed with migraine headaches?" If so, ask: "Is this headache similar?"      No 8. HEAD INJURY: "Has there been any recent injury to the head?"      No 9. OTHER SYMPTOMS: "Do you have any other symptoms?" (fever, stiff neck, eye pain, sore throat, cold symptoms)     Nausea and dizziness. BP 154/83 10. PREGNANCY: "Is there any chance you are pregnant?" "When  was your last menstrual period?"       No  Protocols used: HEADACHE-A-AH

## 2018-02-27 NOTE — Telephone Encounter (Signed)
Received call from patient family to get a sooner appt about blood pressure issues. Pt has an appt here 11/1 but pt will try PCP to get an immediate appt to address issue

## 2018-02-28 MED ORDER — PANTOPRAZOLE SODIUM 40 MG PO TBEC: 40.0000 mg | DELAYED_RELEASE_TABLET | Freq: Every day | ORAL | 11 refills | Status: DC

## 2018-02-28 MED ORDER — ZOLPIDEM TARTRATE 5 MG PO TABS: 2.5000 mg | ORAL_TABLET | Freq: Every evening | ORAL | 3 refills | Status: DC | PRN

## 2018-02-28 MED ORDER — TRAMADOL HCL 50 MG PO TABS: ORAL_TABLET | ORAL | 0 refills | Status: DC

## 2018-03-03 ENCOUNTER — Inpatient Hospital Stay: Payer: BLUE CROSS/BLUE SHIELD

## 2018-03-03 ENCOUNTER — Encounter: Payer: Self-pay | Admitting: Family

## 2018-03-03 ENCOUNTER — Inpatient Hospital Stay: Payer: BLUE CROSS/BLUE SHIELD | Attending: Hematology & Oncology | Admitting: Family

## 2018-03-03 VITALS — BP 134/75 | HR 80 | Temp 97.9°F | Resp 18 | Wt 212.2 lb

## 2018-03-03 DIAGNOSIS — F32A Depression, unspecified: Secondary | ICD-10-CM

## 2018-03-03 DIAGNOSIS — D500 Iron deficiency anemia secondary to blood loss (chronic): Secondary | ICD-10-CM

## 2018-03-03 DIAGNOSIS — R4589 Other symptoms and signs involving emotional state: Secondary | ICD-10-CM

## 2018-03-03 DIAGNOSIS — N951 Menopausal and female climacteric states: Secondary | ICD-10-CM

## 2018-03-03 DIAGNOSIS — R41840 Attention and concentration deficit: Secondary | ICD-10-CM

## 2018-03-03 DIAGNOSIS — F329 Major depressive disorder, single episode, unspecified: Secondary | ICD-10-CM

## 2018-03-03 DIAGNOSIS — D509 Iron deficiency anemia, unspecified: Secondary | ICD-10-CM | POA: Diagnosis not present

## 2018-03-03 DIAGNOSIS — Z79899 Other long term (current) drug therapy: Secondary | ICD-10-CM | POA: Diagnosis not present

## 2018-03-03 LAB — CBC WITH DIFFERENTIAL (CANCER CENTER ONLY)
Abs Immature Granulocytes: 0.07 10*3/uL (ref 0.00–0.07)
Basophils Absolute: 0.1 10*3/uL (ref 0.0–0.1)
Basophils Relative: 1 %
Eosinophils Absolute: 0.1 10*3/uL (ref 0.0–0.5)
Eosinophils Relative: 1 %
HCT: 41 % (ref 36.0–46.0)
Hemoglobin: 12.9 g/dL (ref 12.0–15.0)
Immature Granulocytes: 1 %
Lymphocytes Relative: 28 %
Lymphs Abs: 2.1 10*3/uL (ref 0.7–4.0)
MCH: 27.4 pg (ref 26.0–34.0)
MCHC: 31.5 g/dL (ref 30.0–36.0)
MCV: 87 fL (ref 80.0–100.0)
Monocytes Absolute: 0.7 10*3/uL (ref 0.1–1.0)
Monocytes Relative: 10 %
Neutro Abs: 4.5 10*3/uL (ref 1.7–7.7)
Neutrophils Relative %: 59 %
Platelet Count: 295 10*3/uL (ref 150–400)
RBC: 4.71 MIL/uL (ref 3.87–5.11)
RDW: 14.6 % (ref 11.5–15.5)
WBC Count: 7.6 10*3/uL (ref 4.0–10.5)
nRBC: 0 % (ref 0.0–0.2)

## 2018-03-03 LAB — RETICULOCYTES
Immature Retic Fract: 3.1 % (ref 2.3–15.9)
RBC.: 4.71 MIL/uL (ref 3.87–5.11)
Retic Count, Absolute: 67.8 10*3/uL (ref 19.0–186.0)
Retic Ct Pct: 1.4 % (ref 0.4–3.1)

## 2018-03-03 LAB — SAVE SMEAR(SSMR), FOR PROVIDER SLIDE REVIEW

## 2018-03-03 NOTE — Progress Notes (Signed)
Hematology and Oncology Follow Up Visit  Amanda [name] [id number] [birth year] 52 y.o. 03/03/2018   Principle Diagnosis:  Iron deficiency anemia    Current Therapy:   IV iron as indicated    Interim History: Amanda [name] is here today with her sister for follow-up. She is a little tearful and states that she is having fatigue along with feeling moody and depressed. She states that she has tried antidepressants and does not like they make her feel. She is not interested in meeting with a therapist or counselor.  She has had episodes of dizziness, palpitations, and SOB with over exertion. She states that she sometimes mixes up words when she tries to talk and can't concentrate.  She received 2 doses of IV iron in August. Iron results today are pending.  She states that when she is stressed she will sweat heavily.  She has not had a cycle in 2-3 years. States that her female organs are in place.  No episodes of bleeding, no bruising or petechiae.  No swelling in her extremities. She does notice positional numbness and tingling in her hands and arms when sleeping, driving and sewing.  No lymphadenopathy noted on exam.  She is eating well and staying hydrated. Her weight is stable.   ECOG Performance Status: 1 - Symptomatic but completely ambulatory  Medications:  Allergies as of 03/03/2018   No Known Allergies     Medication List        Accurate as of 03/03/18  2:05 PM. Always use your most recent med list.          b complex vitamins tablet Take 1 tablet by mouth daily.   cetirizine 10 MG tablet Commonly known as:  ZYRTEC Take 10 mg by mouth daily.   methocarbamol 500 MG tablet Commonly known as:  ROBAXIN Take 1 tablet (500 mg total) by mouth at bedtime as needed.   methylPREDNISolone 4 MG Tbpk tablet Commonly known as:  MEDROL DOSEPAK Taper as directed   pantoprazole 40 MG tablet Commonly known as:  PROTONIX Take 1 tablet (40 mg total) by mouth daily.   traMADol 50 MG  tablet Commonly known as:  ULTRAM TAKE 1 TABLET BY MOUTH EVERY 6 HOURS AS NEEDED FOR SEVERE PAIN   Vitamin B-12 1000 MCG Subl Place 1 tablet (1,000 mcg total) under the tongue daily.   Vitamin D3 2000 units capsule Take 1 capsule (2,000 Units total) by mouth daily.   zolpidem 5 MG tablet Commonly known as:  AMBIEN Take 0.5-1 tablets (2.5-5 mg total) by mouth at bedtime as needed for sleep.       Allergies: No Known Allergies  Past Medical History, Surgical history, Social history, and Family History were reviewed and updated.  Review of Systems: All other 10 point review of systems is negative.   Physical Exam:  weight is 212 lb 3.2 oz (96.3 kg). Her oral temperature is 97.9 F (36.6 C). Her blood pressure is 134/75 and her pulse is 80. Her respiration is 18 and oxygen saturation is 100%.   Wt Readings from Last 3 Encounters:  03/03/18 212 lb 3.2 oz (96.3 kg)  02/27/18 208 lb 1.3 oz (94.4 kg)  12/01/17 211 lb (95.7 kg)    Ocular: Sclerae unicteric, pupils equal, round and reactive to light Ear-nose-throat: Oropharynx clear, dentition fair Lymphatic: No cervical, supraclavicular or axillary adenopathy Lungs no rales or rhonchi, good excursion bilaterally Heart regular rate and rhythm, no murmur appreciated Abd soft, nontender, positive bowel sounds, no  liver or spleen tip palpated on exam, no fluid wave  MSK no focal spinal tenderness, no joint edema Neuro: non-focal, well-oriented, appropriate affect Breasts: Deferred   Lab Results  Component Value Date   WBC 7.6 03/03/2018   HGB 12.9 03/03/2018   HCT 41.0 03/03/2018   MCV 87.0 03/03/2018   PLT 295 03/03/2018   Lab Results  Component Value Date   FERRITIN 8 (L) 12/01/2017   IRON 30 (L) 12/01/2017   TIBC 431 12/01/2017   UIBC 401 12/01/2017   IRONPCTSAT 7 (L) 12/01/2017   Lab Results  Component Value Date   RETICCTPCT 1.4 03/03/2018   RBC 4.71 03/03/2018   RBC 4.71 03/03/2018   No results found for:  KPAFRELGTCHN, LAMBDASER, KAPLAMBRATIO No results found for: IGGSERUM, IGA, IGMSERUM No results found for: Marda Stalker, SPEI   Chemistry      Component Value Date/Time   NA 143 12/01/2017 1649   K 4.1 12/01/2017 1649   CL 108 12/01/2017 1649   CO2 27 12/01/2017 1649   BUN 13 12/01/2017 1649   CREATININE 0.82 12/01/2017 1649      Component Value Date/Time   CALCIUM 9.4 12/01/2017 1649   ALKPHOS 58 12/01/2017 1649   AST 20 12/01/2017 1649   ALT 17 12/01/2017 1649   BILITOT 0.1 (L) 12/01/2017 1649       Impression and Plan: Amanda [name] is a pleasant 52 yo [place] female with iron deficiency anemia. She is symptomatic as mentioned above.  We will see what her iron studies show and bring her back in for infusion if needed.  I spoke with Dr. Myna Hidalgo about her feelings of moodiness, stress, sweating and we will refer her to Gynecology for further work up. This could possibly be due to a hormonal imbalance.   We will plan to see her back in another 6 months.  She will contact our office with any questions or concerns. We can certainly see her sooner if need be.   Emeline Gins, NP 11/1/20192:05 PM

## 2018-03-06 ENCOUNTER — Telehealth: Payer: Self-pay | Admitting: *Deleted

## 2018-03-06 LAB — IRON AND TIBC
Iron: 54 ug/dL (ref 41–142)
Saturation Ratios: 14 % — ABNORMAL LOW (ref 21–57)
TIBC: 385 ug/dL (ref 236–444)
UIBC: 331 ug/dL (ref 120–384)

## 2018-03-06 LAB — FERRITIN: Ferritin: 14 ng/mL (ref 11–307)

## 2018-03-06 NOTE — Telephone Encounter (Addendum)
Patient is aware of results. Appointment made.   ----- Message from [name], MD sent at 03/06/2018 11:34 AM EST ----- Call - iron is low!!!  Needs 1 dose of Feraheme!!  [name]

## 2018-03-07 ENCOUNTER — Inpatient Hospital Stay: Payer: BLUE CROSS/BLUE SHIELD

## 2018-03-07 VITALS — BP 124/69 | HR 83 | Temp 97.8°F | Resp 17

## 2018-03-07 DIAGNOSIS — D509 Iron deficiency anemia, unspecified: Secondary | ICD-10-CM | POA: Diagnosis not present

## 2018-03-07 DIAGNOSIS — D500 Iron deficiency anemia secondary to blood loss (chronic): Secondary | ICD-10-CM

## 2018-03-07 DIAGNOSIS — Z79899 Other long term (current) drug therapy: Secondary | ICD-10-CM | POA: Diagnosis not present

## 2018-03-07 MED ORDER — SODIUM CHLORIDE 0.9 % IV SOLN
510.0000 mg | Freq: Once | INTRAVENOUS | Status: AC
  Administered 2018-03-07: 510 mg via INTRAVENOUS
  Filled 2018-03-07: qty 17

## 2018-03-07 MED ORDER — SODIUM CHLORIDE 0.9 % IV SOLN
Freq: Once | INTRAVENOUS | Status: AC
  Administered 2018-03-07: 13:00:00 via INTRAVENOUS
  Filled 2018-03-07: qty 250

## 2018-03-07 NOTE — Patient Instructions (Signed)

## 2018-04-29 ENCOUNTER — Other Ambulatory Visit: Payer: Self-pay | Admitting: Internal Medicine

## 2018-05-30 ENCOUNTER — Ambulatory Visit: Payer: BLUE CROSS/BLUE SHIELD | Admitting: Family

## 2018-05-30 ENCOUNTER — Encounter: Payer: Self-pay | Admitting: Family

## 2018-05-30 VITALS — BP 132/84 | HR 109 | Temp 99.3°F | Ht 62.0 in | Wt 213.1 lb

## 2018-05-30 DIAGNOSIS — J209 Acute bronchitis, unspecified: Secondary | ICD-10-CM

## 2018-05-30 MED ORDER — HYDROCODONE-HOMATROPINE 5-1.5 MG/5ML PO SYRP: 5.0000 mL | ORAL_SOLUTION | Freq: Three times a day (TID) | ORAL | 0 refills | Status: DC | PRN

## 2018-05-30 MED ORDER — PREDNISONE 20 MG PO TABS: 20.0000 mg | ORAL_TABLET | Freq: Every day | ORAL | 0 refills | Status: DC

## 2018-05-30 MED ORDER — DOXYCYCLINE HYCLATE 100 MG PO TABS: 100.0000 mg | ORAL_TABLET | Freq: Two times a day (BID) | ORAL | 0 refills | Status: DC

## 2018-05-30 NOTE — Progress Notes (Signed)
Amanda [name] is a 53 y.o. female with the following history as recorded in [employer]:  Patient Active Problem List   Diagnosis Date Noted  . Symptomatic anemia 10/14/2017  . B12 deficiency 08/09/2016  . Vitamin D deficiency 08/09/2016  . Back pain 06/28/2016  . Meralgia paresthetica 06/28/2016  . Upper respiratory infection 06/28/2016  . Grief reaction 06/28/2016  . Fatigue 01/20/2009  . PARESTHESIA 01/20/2009  . CHEST PAIN 04/29/2008  . ANEMIA-IRON DEFICIENCY 08/09/2007  . Anxiety state 08/09/2007  . Depression, reactive 07/28/2007  . ESOPHAGITIS, REFLUX 07/28/2007  . GERD 07/28/2007  . GASTRITIS, ACUTE 07/28/2007  . HIATAL HERNIA 07/28/2007  . Fibromyalgia syndrome 07/28/2007  . GASTROINTESTINAL HEMORRHAGE, HX OF 07/28/2007  . HEADACHES, HX OF 07/28/2007    Current Outpatient Medications  Medication Sig Dispense Refill  . b complex vitamins tablet Take 1 tablet by mouth daily. 100 tablet 3  . cetirizine (ZYRTEC) 10 MG tablet Take 10 mg by mouth daily.    . Cholecalciferol (VITAMIN D3) 2000 units capsule Take 1 capsule (2,000 Units total) by mouth daily. 100 capsule 3  . Cyanocobalamin (VITAMIN B-12) 1000 MCG SUBL Place 1 tablet (1,000 mcg total) under the tongue daily. 100 tablet 3  . pantoprazole (PROTONIX) 40 MG tablet Take 1 tablet (40 mg total) by mouth daily. 30 tablet 11  . traMADol (ULTRAM) 50 MG tablet TAKE 1 TABLET BY MOUTH EVERY 6 HOURS AS NEEDED FOR SEVERE PAIN 60 tablet 2  . zolpidem (AMBIEN) 5 MG tablet Take 0.5-1 tablets (2.5-5 mg total) by mouth at bedtime as needed for sleep. 30 tablet 3  . doxycycline (VIBRA-TABS) 100 MG tablet Take 1 tablet (100 mg total) by mouth 2 (two) times daily. 20 tablet 0  . HYDROcodone-homatropine (HYCODAN) 5-1.5 MG/5ML syrup Take 5 mLs by mouth every 8 (eight) hours as needed for cough. 120 mL 0  . predniSONE (DELTASONE) 20 MG tablet Take 1 tablet (20 mg total) by mouth daily with breakfast. 5 tablet 0   No current  facility-administered medications for this visit.     Allergies: Patient has no known allergies.  Past Medical History:  Diagnosis Date  . Anemia 2019   iron def  . Anxiety   . Depression   . GERD (gastroesophageal reflux disease)     Past Surgical History:  Procedure Laterality Date  . BIOPSY  10/16/2017   Procedure: BIOPSY;  Surgeon: Jeani Hawking, MD;  Location: [facility];  Service: Endoscopy;;  . COLONOSCOPY N/A 10/16/2017   Procedure: COLONOSCOPY;  Surgeon: Jeani Hawking, MD;  Location: [facility];  Service: Endoscopy;  Laterality: N/A;  . ESOPHAGOGASTRODUODENOSCOPY N/A 10/16/2017   Procedure: ESOPHAGOGASTRODUODENOSCOPY (EGD);  Surgeon: Jeani Hawking, MD;  Location: [facility];  Service: Endoscopy;  Laterality: N/A;    Family History  Problem Relation Age of Onset  . Diabetes Mother 85  . Hypertension Mother   . Parkinson's disease Father   . Asthma Father   . Cancer Sister   . Heart Problems Maternal Grandmother   . Asthma Maternal Grandfather     Social History   Tobacco Use  . Smoking status: Never Smoker  . Smokeless tobacco: Never Used  Substance Use Topics  . Alcohol use: No    Subjective:  Patient presents with 5 day history of cough/ congestion; + deep, barking cough; not prone to bronchitis or pneumonia; started with flu-like symptoms and progressively worsened; has been taking OTC Dayquil and Nyquil with limited benefit;     Objective:  Vitals:  05/30/18 1129  BP: 132/84  Pulse: (!) 109  Temp: 99.3 F (37.4 C)  TempSrc: Oral  SpO2: 98%  Weight: 213 lb 1.9 oz (96.7 kg)  Height: 5\' 2"  (1.575 m)    General: Well developed, well nourished, in no acute distress  Skin : Warm and dry.  Head: Normocephalic and atraumatic  Eyes: Sclera and conjunctiva clear; pupils round and reactive to light; extraocular movements intact  Ears: External normal; canals clear; tympanic membranes normal  Oropharynx: Pink, supple. No suspicious lesions  Neck: Supple  without thyromegaly, adenopathy  Lungs: Respirations unlabored; coarse breath sounds in all 4 lobes CVS exam: normal rate and regular rhythm.  Neurologic: Alert and oriented; speech intact; face symmetrical; moves all extremities well; CNII-XII intact without focal deficit   Assessment:  1. Acute bronchitis, unspecified organism     Plan:  Rx for Doxycycline 100 mg bid x 10 days, Prednisone 20 mg qd x 5 days, Hycodan cough syrup as needed; increase fluids, rest and follow-up worse, no better; Work note given as requested for the remainder of the week.   No follow-ups on file.  No orders of the defined types were placed in this encounter.   Requested Prescriptions   Signed Prescriptions Disp Refills  . doxycycline (VIBRA-TABS) 100 MG tablet 20 tablet 0    Sig: Take 1 tablet (100 mg total) by mouth 2 (two) times daily.  . predniSONE (DELTASONE) 20 MG tablet 5 tablet 0    Sig: Take 1 tablet (20 mg total) by mouth daily with breakfast.  . HYDROcodone-homatropine (HYCODAN) 5-1.5 MG/5ML syrup 120 mL 0    Sig: Take 5 mLs by mouth every 8 (eight) hours as needed for cough.

## 2018-05-30 NOTE — Patient Instructions (Signed)
Acute Bronchitis, Adult Acute bronchitis is when air tubes (bronchi) in the lungs suddenly get swollen. The condition can make it hard to breathe. It can also cause these symptoms:  A cough.  Coughing up clear, yellow, or green mucus.  Wheezing.  Chest congestion.  Shortness of breath.  A fever.  Body aches.  Chills.  A sore throat. Follow these instructions at home:  Medicines  Take over-the-counter and prescription medicines only as told by your doctor.  If you were prescribed an antibiotic medicine, take it as told by your doctor. Do not stop taking the antibiotic even if you start to feel better. General instructions  Rest.  Drink enough fluids to keep your pee (urine) pale yellow.  Avoid smoking and secondhand smoke. If you smoke and you need help quitting, ask your doctor. Quitting will help your lungs heal faster.  Use an inhaler, cool mist vaporizer, or humidifier as told by your doctor.  Keep all follow-up visits as told by your doctor. This is important. How is this prevented? To lower your risk of getting this condition again:  Wash your hands often with soap and water. If you cannot use soap and water, use hand sanitizer.  Avoid contact with people who have cold symptoms.  Try not to touch your hands to your mouth, nose, or eyes.  Make sure to get the flu shot every year. Contact a doctor if:  Your symptoms do not get better in 2 weeks. Get help right away if:  You cough up blood.  You have chest pain.  You have very bad shortness of breath.  You become dehydrated.  You faint (pass out) or keep feeling like you are going to pass out.  You keep throwing up (vomiting).  You have a very bad headache.  Your fever or chills gets worse. This information is not intended to replace advice given to you by your health care provider. Make sure you discuss any questions you have with your health care provider. Document Released: 10/06/2007 Document  Revised: 12/01/2016 Document Reviewed: 10/08/2015 Elsevier Interactive Patient Education  2019 Elsevier Inc.  

## 2018-06-06 ENCOUNTER — Ambulatory Visit (INDEPENDENT_AMBULATORY_CARE_PROVIDER_SITE_OTHER)
Admission: RE | Admit: 2018-06-06 | Discharge: 2018-06-06 | Disposition: A | Discharge status: Home / Self Care | Payer: BLUE CROSS/BLUE SHIELD | Source: Ambulatory Visit | Attending: Internal Medicine | Admitting: Internal Medicine

## 2018-06-06 ENCOUNTER — Ambulatory Visit (INDEPENDENT_AMBULATORY_CARE_PROVIDER_SITE_OTHER): Payer: BLUE CROSS/BLUE SHIELD | Admitting: Internal Medicine

## 2018-06-06 ENCOUNTER — Other Ambulatory Visit (INDEPENDENT_AMBULATORY_CARE_PROVIDER_SITE_OTHER): Payer: BLUE CROSS/BLUE SHIELD

## 2018-06-06 ENCOUNTER — Encounter: Payer: Self-pay | Admitting: Internal Medicine

## 2018-06-06 DIAGNOSIS — D500 Iron deficiency anemia secondary to blood loss (chronic): Secondary | ICD-10-CM

## 2018-06-06 DIAGNOSIS — D649 Anemia, unspecified: Secondary | ICD-10-CM

## 2018-06-06 DIAGNOSIS — J069 Acute upper respiratory infection, unspecified: Secondary | ICD-10-CM | POA: Diagnosis not present

## 2018-06-06 DIAGNOSIS — E538 Deficiency of other specified B group vitamins: Secondary | ICD-10-CM | POA: Diagnosis not present

## 2018-06-06 DIAGNOSIS — R05 Cough: Secondary | ICD-10-CM | POA: Diagnosis not present

## 2018-06-06 LAB — CBC WITH DIFFERENTIAL/PLATELET
Basophils Absolute: 0.1 10*3/uL (ref 0.0–0.1)
Basophils Relative: 1 % (ref 0.0–3.0)
Eosinophils Absolute: 0.1 10*3/uL (ref 0.0–0.7)
Eosinophils Relative: 2.8 % (ref 0.0–5.0)
HCT: 38.6 % (ref 36.0–46.0)
Hemoglobin: 12.9 g/dL (ref 12.0–15.0)
Lymphocytes Relative: 36.1 % (ref 12.0–46.0)
Lymphs Abs: 1.9 10*3/uL (ref 0.7–4.0)
MCHC: 33.4 g/dL (ref 30.0–36.0)
MCV: 83.7 fl (ref 78.0–100.0)
Monocytes Absolute: 0.4 10*3/uL (ref 0.1–1.0)
Monocytes Relative: 8.2 % (ref 3.0–12.0)
Neutro Abs: 2.7 10*3/uL (ref 1.4–7.7)
Neutrophils Relative %: 51.9 % (ref 43.0–77.0)
Platelets: 279 10*3/uL (ref 150.0–400.0)
RBC: 4.61 Mil/uL (ref 3.87–5.11)
RDW: 13.2 % (ref 11.5–15.5)
WBC: 5.3 10*3/uL (ref 4.0–10.5)

## 2018-06-06 LAB — BASIC METABOLIC PANEL
BUN: 11 mg/dL (ref 6–23)
CO2: 30 mEq/L (ref 19–32)
Calcium: 9.6 mg/dL (ref 8.4–10.5)
Chloride: 103 mEq/L (ref 96–112)
Creatinine, Ser: 0.82 mg/dL (ref 0.40–1.20)
GFR: 73.48 mL/min (ref >60.00)
Glucose, Bld: 83 mg/dL (ref 70–99)
Potassium: 3.9 mEq/L (ref 3.5–5.1)
Sodium: 141 mEq/L (ref 135–145)

## 2018-06-06 MED ORDER — HYDROCODONE-HOMATROPINE 5-1.5 MG/5ML PO SYRP: 5.0000 mL | ORAL_SOLUTION | Freq: Three times a day (TID) | ORAL | 0 refills | Status: DC | PRN

## 2018-06-06 MED ORDER — CEFDINIR 300 MG PO CAPS: 300.0000 mg | ORAL_CAPSULE | Freq: Two times a day (BID) | ORAL | 0 refills | Status: DC

## 2018-06-06 NOTE — Assessment & Plan Note (Signed)
Vit B12 

## 2018-06-06 NOTE — Assessment & Plan Note (Signed)
Omnicef Hycodan CXR

## 2018-06-06 NOTE — Assessment & Plan Note (Signed)
CBC

## 2018-06-06 NOTE — Patient Instructions (Addendum)
You can use over-the-counter  "cold" medicines  such as "Tylenol cold" , "Advil cold",  "Mucinex" or" Mucinex D"  for cough and congestion.   Avoid decongestants if you have high blood pressure and use "Afrin" nasal spray for nasal congestion as directed. Use " Delsym" or" Robitussin" cough syrup varietis for cough.  You can use plain "Tylenol" or "Advil" for fever, chills and achyness. Use Halls or Ricola cough drops.   Please, make an appointment if you are not better or if you're worse.  

## 2018-06-06 NOTE — Progress Notes (Signed)
Subjective:  Patient ID: Amanda [name], female    DOB: 1965-05-23  Age: 53 y.o. MRN: [id number]  CC: No chief complaint on file.   HPI Amanda Coriaadia Asfour presents for cough x 2 weeks - worse Doxy abx fell in the sink   Outpatient Medications Prior to Visit  Medication Sig Dispense Refill  . b complex vitamins tablet Take 1 tablet by mouth daily. 100 tablet 3  . cetirizine (ZYRTEC) 10 MG tablet Take 10 mg by mouth daily.    . Cholecalciferol (VITAMIN D3) 2000 units capsule Take 1 capsule (2,000 Units total) by mouth daily. 100 capsule 3  . Cyanocobalamin (VITAMIN B-12) 1000 MCG SUBL Place 1 tablet (1,000 mcg total) under the tongue daily. 100 tablet 3  . doxycycline (VIBRA-TABS) 100 MG tablet Take 1 tablet (100 mg total) by mouth 2 (two) times daily. 20 tablet 0  . HYDROcodone-homatropine (HYCODAN) 5-1.5 MG/5ML syrup Take 5 mLs by mouth every 8 (eight) hours as needed for cough. 120 mL 0  . pantoprazole (PROTONIX) 40 MG tablet Take 1 tablet (40 mg total) by mouth daily. 30 tablet 11  . predniSONE (DELTASONE) 20 MG tablet Take 1 tablet (20 mg total) by mouth daily with breakfast. 5 tablet 0  . traMADol (ULTRAM) 50 MG tablet TAKE 1 TABLET BY MOUTH EVERY 6 HOURS AS NEEDED FOR SEVERE PAIN 60 tablet 2  . zolpidem (AMBIEN) 5 MG tablet Take 0.5-1 tablets (2.5-5 mg total) by mouth at bedtime as needed for sleep. 30 tablet 3   No facility-administered medications prior to visit.     ROS: Review of Systems  Constitutional: Positive for fatigue. Negative for activity change, appetite change, chills and unexpected weight change.  HENT: Positive for sore throat. Negative for congestion, mouth sores and sinus pressure.   Eyes: Negative for visual disturbance.  Respiratory: Positive for cough. Negative for chest tightness.   Gastrointestinal: Positive for nausea. Negative for abdominal pain.  Genitourinary: Negative for difficulty urinating, frequency and vaginal pain.  Musculoskeletal: Positive for  arthralgias. Negative for gait problem.  Skin: Negative for pallor and rash.  Neurological: Negative for dizziness, tremors, weakness, numbness and headaches.  Psychiatric/Behavioral: Negative for confusion, sleep disturbance and suicidal ideas.    Objective:  BP 128/82 (BP Location: Left Arm, Patient Position: Sitting, Cuff Size: Large)   Pulse 71   Temp 97.7 F (36.5 C) (Oral)   Ht 5\' 2"  (1.575 m)   Wt 213 lb (96.6 kg)   SpO2 99%   BMI 38.96 kg/m   BP Readings from Last 3 Encounters:  06/06/18 128/82  05/30/18 132/84  03/07/18 124/69    Wt Readings from Last 3 Encounters:  06/06/18 213 lb (96.6 kg)  05/30/18 213 lb 1.9 oz (96.7 kg)  03/03/18 212 lb 3.2 oz (96.3 kg)    Physical Exam Constitutional:      General: She is not in acute distress.    Appearance: She is well-developed.  HENT:     Head: Normocephalic.     Right Ear: External ear normal.     Left Ear: External ear normal.     Nose: Congestion and rhinorrhea present.     Mouth/Throat:     Pharynx: Posterior oropharyngeal erythema present.  Eyes:     General:        Right eye: No discharge.        Left eye: No discharge.     Conjunctiva/sclera: Conjunctivae normal.     Pupils: Pupils are equal, round, and reactive  to light.  Neck:     Musculoskeletal: Normal range of motion and neck supple.     Thyroid: No thyromegaly.     Vascular: No JVD.     Trachea: No tracheal deviation.  Cardiovascular:     Rate and Rhythm: Normal rate and regular rhythm.     Heart sounds: Normal heart sounds.  Pulmonary:     Effort: No respiratory distress.     Breath sounds: No stridor. No wheezing.  Abdominal:     General: Bowel sounds are normal. There is no distension.     Palpations: Abdomen is soft. There is no mass.     Tenderness: There is no abdominal tenderness. There is no guarding or rebound.  Musculoskeletal:        General: No tenderness.  Lymphadenopathy:     Cervical: No cervical adenopathy.  Skin:     Findings: No erythema or rash.  Neurological:     Cranial Nerves: No cranial nerve deficit.     Motor: No abnormal muscle tone.     Coordination: Coordination normal.     Deep Tendon Reflexes: Reflexes normal.  Psychiatric:        Behavior: Behavior normal.        Thought Content: Thought content normal.        Judgment: Judgment normal.   obese   Lab Results  Component Value Date   WBC 7.6 03/03/2018   HGB 12.9 03/03/2018   HCT 41.0 03/03/2018   PLT 295 03/03/2018   GLUCOSE 97 12/01/2017   ALT 17 12/01/2017   AST 20 12/01/2017   NA 143 12/01/2017   K 4.1 12/01/2017   CL 108 12/01/2017   CREATININE 0.82 12/01/2017   BUN 13 12/01/2017   CO2 27 12/01/2017   TSH 1.64 10/11/2017   INR 1.03 10/14/2017    Dg Cervical Spine 2 Or 3 Views  Result Date: 02/27/2018 CLINICAL DATA:  Right neck pain x1 week EXAM: CERVICAL SPINE - 2-3 VIEW COMPARISON:  None. FINDINGS: Normal cervical lordosis. No evidence of fracture or dislocation. Vertebral body heights are maintained. Dens partially obscured but intact. Lateral masses C1 are symmetric. No prevertebral soft tissue swelling. Mild degenerative changes at C4-5. Visualized lung apices are clear. IMPRESSION: Negative cervical spine radiographs. Electronically Signed   By: Charline Bills M.D.   On: 02/27/2018 21:55    Assessment & Plan:   There are no diagnoses linked to this encounter.   No orders of the defined types were placed in this encounter.    Follow-up: No follow-ups on file.  Sonda Primes, MD

## 2018-06-07 LAB — IRON,TIBC AND FERRITIN PANEL
%SAT: 11 % (calc) — ABNORMAL LOW (ref 16–45)
Ferritin: 26 ng/mL (ref 16–232)
Iron: 40 ug/dL — ABNORMAL LOW (ref 45–160)
TIBC: 361 mcg/dL (calc) (ref 250–450)

## 2018-08-16 ENCOUNTER — Other Ambulatory Visit: Payer: Self-pay

## 2018-08-16 ENCOUNTER — Other Ambulatory Visit: Payer: Self-pay | Admitting: Family

## 2018-08-29 ENCOUNTER — Other Ambulatory Visit: Payer: Self-pay | Admitting: Internal Medicine

## 2018-09-01 ENCOUNTER — Encounter: Payer: Self-pay | Admitting: Hematology & Oncology

## 2018-09-01 ENCOUNTER — Inpatient Hospital Stay: Payer: BLUE CROSS/BLUE SHIELD

## 2018-09-01 ENCOUNTER — Inpatient Hospital Stay: Payer: BLUE CROSS/BLUE SHIELD | Attending: Hematology & Oncology | Admitting: Hematology & Oncology

## 2018-09-01 ENCOUNTER — Other Ambulatory Visit: Payer: Self-pay

## 2018-09-01 VITALS — BP 159/92 | HR 93 | Temp 97.0°F | Resp 20 | Wt 220.0 lb

## 2018-09-01 DIAGNOSIS — D510 Vitamin B12 deficiency anemia due to intrinsic factor deficiency: Secondary | ICD-10-CM | POA: Insufficient documentation

## 2018-09-01 DIAGNOSIS — D500 Iron deficiency anemia secondary to blood loss (chronic): Secondary | ICD-10-CM

## 2018-09-01 DIAGNOSIS — D509 Iron deficiency anemia, unspecified: Secondary | ICD-10-CM | POA: Insufficient documentation

## 2018-09-01 DIAGNOSIS — Z79899 Other long term (current) drug therapy: Secondary | ICD-10-CM | POA: Diagnosis not present

## 2018-09-01 DIAGNOSIS — F411 Generalized anxiety disorder: Secondary | ICD-10-CM

## 2018-09-01 DIAGNOSIS — E538 Deficiency of other specified B group vitamins: Secondary | ICD-10-CM

## 2018-09-01 DIAGNOSIS — Z803 Family history of malignant neoplasm of breast: Secondary | ICD-10-CM

## 2018-09-01 LAB — RETICULOCYTES
Immature Retic Fract: 12.1 % (ref 2.3–15.9)
RBC.: 4.5 MIL/uL (ref 3.87–5.11)
Retic Count, Absolute: 75.6 10*3/uL (ref 19.0–186.0)
Retic Ct Pct: 1.7 % (ref 0.4–3.1)

## 2018-09-01 LAB — CBC WITH DIFFERENTIAL (CANCER CENTER ONLY)
Abs Immature Granulocytes: 0.02 10*3/uL (ref 0.00–0.07)
Basophils Absolute: 0.1 10*3/uL (ref 0.0–0.1)
Basophils Relative: 1 %
Eosinophils Absolute: 0.2 10*3/uL (ref 0.0–0.5)
Eosinophils Relative: 4 %
HCT: 37.3 % (ref 36.0–46.0)
Hemoglobin: 11.9 g/dL — ABNORMAL LOW (ref 12.0–15.0)
Immature Granulocytes: 0 %
Lymphocytes Relative: 32 %
Lymphs Abs: 1.7 10*3/uL (ref 0.7–4.0)
MCH: 27 pg (ref 26.0–34.0)
MCHC: 31.9 g/dL (ref 30.0–36.0)
MCV: 84.8 fL (ref 80.0–100.0)
Monocytes Absolute: 0.5 10*3/uL (ref 0.1–1.0)
Monocytes Relative: 9 %
Neutro Abs: 2.8 10*3/uL (ref 1.7–7.7)
Neutrophils Relative %: 54 %
Platelet Count: 291 10*3/uL (ref 150–400)
RBC: 4.4 MIL/uL (ref 3.87–5.11)
RDW: 13.6 % (ref 11.5–15.5)
WBC Count: 5.3 10*3/uL (ref 4.0–10.5)
nRBC: 0 % (ref 0.0–0.2)

## 2018-09-01 LAB — SAVE SMEAR(SSMR), FOR PROVIDER SLIDE REVIEW

## 2018-09-01 MED ORDER — METOPROLOL-HYDROCHLOROTHIAZIDE 50-25 MG PO TABS: 1.0000 | ORAL_TABLET | Freq: Every day | ORAL | 3 refills | Status: DC

## 2018-09-01 NOTE — Progress Notes (Signed)
Hematology and Oncology Follow Up Visit  Amanda [name] [id number] November 09, 1965 53 y.o. 09/01/2018   Principle Diagnosis:  Iron deficiency anemia    Current Therapy:   IV iron as indicated    Interim History: Amanda [name] is here today for follow-up.  She is doing okay.  She really does not have any complaints although she does feel more tired.  She says that she does not have as much energy.  She feels that her iron might be low.  Her weight is going up.  Her skin does seem to be dry.  I will check a TSH on her for her thyroid to make sure she is not hypothyroid.  She has not had a mammogram.  She really needs to have a mammogram.  We will have to set this up for her.  Her blood pressure is on the high side.  She does not see her family doctor for several months.  As such, I will have to start her on something for blood pressure.  Her blood pressure today is 159/92.  I thought this was just too high for her.  I will put her on Lopressor/hydrochlorothiazide (50/25 1 p.o. daily).  I will have to let her family doctor know what we did.  Her sister, who I take care of, has yet to have a bone marrow transplant for her myelofibrosis.  Overall, her performance status is ECOG 1.   Medications:  Allergies as of 09/01/2018   No Known Allergies     Medication List       Accurate as of Sep 01, 2018  1:54 PM. Always use your most recent med list.        b complex vitamins tablet Take 1 tablet by mouth daily.   cefdinir 300 MG capsule Commonly known as:  OMNICEF Take 1 capsule (300 mg total) by mouth 2 (two) times daily.   cetirizine 10 MG tablet Commonly known as:  ZYRTEC Take 10 mg by mouth daily.   doxycycline 100 MG tablet Commonly known as:  VIBRA-TABS Take 1 tablet (100 mg total) by mouth 2 (two) times daily.   HYDROcodone-homatropine 5-1.5 MG/5ML syrup Commonly known as:  HYCODAN Take 5 mLs by mouth every 8 (eight) hours as needed for cough.   pantoprazole 40 MG tablet  Commonly known as:  Protonix Take 1 tablet (40 mg total) by mouth daily.   predniSONE 20 MG tablet Commonly known as:  DELTASONE Take 1 tablet (20 mg total) by mouth daily with breakfast.   traMADol 50 MG tablet Commonly known as:  ULTRAM TAKE 1 TABLET BY MOUTH EVERY 6 HOURS AS NEEDED FOR SEVERE PAIN   Vitamin B-12 1000 MCG Subl Place 1 tablet (1,000 mcg total) under the tongue daily.   Vitamin D3 50 MCG (2000 UT) capsule Take 1 capsule (2,000 Units total) by mouth daily.   zolpidem 5 MG tablet Commonly known as:  AMBIEN TAKE 1/2 TO 1 (ONE-HALF TO ONE) TABLET BY MOUTH AT BEDTIME AS NEEDED FOR SLEEP       Allergies: No Known Allergies  Past Medical History, Surgical history, Social history, and Family History were reviewed and updated.  Review of Systems: Physical Exam Vitals signs reviewed.  HENT:     Head: Normocephalic and atraumatic.  Eyes:     Pupils: Pupils are equal, round, and reactive to light.  Neck:     Musculoskeletal: Normal range of motion.  Cardiovascular:     Rate and Rhythm: Normal rate and regular  rhythm.     Heart sounds: Normal heart sounds.  Pulmonary:     Effort: Pulmonary effort is normal.     Breath sounds: Normal breath sounds.  Abdominal:     General: Bowel sounds are normal.     Palpations: Abdomen is soft.  Musculoskeletal: Normal range of motion.        General: No tenderness or deformity.  Lymphadenopathy:     Cervical: No cervical adenopathy.  Skin:    General: Skin is warm and dry.     Findings: No erythema or rash.  Neurological:     Mental Status: She is alert and oriented to person, place, and time.  Psychiatric:        Behavior: Behavior normal.        Thought Content: Thought content normal.        Judgment: Judgment normal.    .   Physical Exam:  weight is 220 lb 0.6 oz (99.8 kg). Her oral temperature is 97 F (36.1 C) (abnormal). Her blood pressure is 159/92 (abnormal) and her pulse is 93. Her respiration is 20  and oxygen saturation is 99%.   Wt Readings from Last 3 Encounters:  09/01/18 220 lb 0.6 oz (99.8 kg)  06/06/18 213 lb (96.6 kg)  05/30/18 213 lb 1.9 oz (96.7 kg)    Review of Systems  Constitutional: Negative.   HENT: Negative.   Eyes: Negative.   Respiratory: Negative.   Cardiovascular: Negative.   Gastrointestinal: Negative.   Genitourinary: Negative.   Musculoskeletal: Negative.   Skin: Negative.   Neurological: Negative.   Endo/Heme/Allergies: Negative.   Psychiatric/Behavioral: The patient is nervous/anxious.      Lab Results  Component Value Date   WBC 5.3 09/01/2018   HGB 11.9 (L) 09/01/2018   HCT 37.3 09/01/2018   MCV 84.8 09/01/2018   PLT 291 09/01/2018   Lab Results  Component Value Date   FERRITIN 26 06/06/2018   IRON 40 (L) 06/06/2018   TIBC 361 06/06/2018   UIBC 331 03/03/2018   IRONPCTSAT 11 (L) 06/06/2018   Lab Results  Component Value Date   RETICCTPCT 1.7 09/01/2018   RBC 4.50 09/01/2018   No results found for: KPAFRELGTCHN, LAMBDASER, KAPLAMBRATIO No results found for: IGGSERUM, IGA, IGMSERUM No results found for: Marda Stalker, SPEI   Chemistry      Component Value Date/Time   NA 141 06/06/2018 1546   K 3.9 06/06/2018 1546   CL 103 06/06/2018 1546   CO2 30 06/06/2018 1546   BUN 11 06/06/2018 1546   CREATININE 0.82 06/06/2018 1546      Component Value Date/Time   CALCIUM 9.6 06/06/2018 1546   ALKPHOS 58 12/01/2017 1649   AST 20 12/01/2017 1649   ALT 17 12/01/2017 1649   BILITOT 0.1 (L) 12/01/2017 1649       Impression and Plan: Amanda [name] is a pleasant 53 yo [place] female with iron deficiency anemia.  She also has pernicious anemia.  She does take vitamin B-12.  We will see what her iron studies show.  Of note, she is yet to see the gynecologist.  She was having some hormonal issues.  I am not sure if she will ever go for her mammogram.  However, I set this up.  We will  get her back to see [place] in 3 months.  Hopefully, her blood pressure will be better.  Hopefully she would have gotten her mammogram.   [name],  MD 5/1/20201:54 PM

## 2018-09-04 ENCOUNTER — Telehealth: Payer: Self-pay | Admitting: *Deleted

## 2018-09-04 ENCOUNTER — Telehealth: Payer: Self-pay | Admitting: Family

## 2018-09-04 LAB — FERRITIN: Ferritin: <4 ng/mL — ABNORMAL LOW (ref 11–307)

## 2018-09-04 LAB — IRON AND TIBC
Iron: 24 ug/dL — ABNORMAL LOW (ref 41–142)
Saturation Ratios: 6 % — ABNORMAL LOW (ref 21–57)
TIBC: 425 ug/dL (ref 236–444)
UIBC: 401 ug/dL — ABNORMAL HIGH (ref 120–384)

## 2018-09-04 LAB — TSH: TSH: 2.62 u[IU]/mL (ref 0.308–3.960)

## 2018-09-04 NOTE — Telephone Encounter (Signed)
-----   Message from [name], MD sent at 09/04/2018 11:24 AM EDT ----- Call - thyroid level is ok! Amanda [name]

## 2018-09-04 NOTE — Telephone Encounter (Signed)
Notified pt of lab results 

## 2018-09-04 NOTE — Telephone Encounter (Signed)
Appointments scheduled LMVM for pateint date/time per 5/4 sch msg

## 2018-09-08 ENCOUNTER — Inpatient Hospital Stay: Payer: BLUE CROSS/BLUE SHIELD

## 2018-09-08 ENCOUNTER — Other Ambulatory Visit: Payer: Self-pay

## 2018-09-08 VITALS — BP 123/81 | HR 68 | Temp 97.9°F | Resp 19

## 2018-09-08 DIAGNOSIS — D509 Iron deficiency anemia, unspecified: Secondary | ICD-10-CM | POA: Diagnosis not present

## 2018-09-08 DIAGNOSIS — Z79899 Other long term (current) drug therapy: Secondary | ICD-10-CM | POA: Diagnosis not present

## 2018-09-08 DIAGNOSIS — D500 Iron deficiency anemia secondary to blood loss (chronic): Secondary | ICD-10-CM

## 2018-09-08 DIAGNOSIS — D510 Vitamin B12 deficiency anemia due to intrinsic factor deficiency: Secondary | ICD-10-CM | POA: Diagnosis not present

## 2018-09-08 MED ORDER — SODIUM CHLORIDE 0.9 % IV SOLN
510.0000 mg | Freq: Once | INTRAVENOUS | Status: AC
  Administered 2018-09-08: 510 mg via INTRAVENOUS
  Filled 2018-09-08: qty 510

## 2018-09-08 MED ORDER — SODIUM CHLORIDE 0.9 % IV SOLN
Freq: Once | INTRAVENOUS | Status: AC
  Administered 2018-09-08: 12:00:00 via INTRAVENOUS
  Filled 2018-09-08: qty 250

## 2018-09-08 NOTE — Patient Instructions (Signed)

## 2018-09-15 ENCOUNTER — Inpatient Hospital Stay: Payer: BLUE CROSS/BLUE SHIELD

## 2018-09-15 ENCOUNTER — Other Ambulatory Visit: Payer: Self-pay

## 2018-09-15 VITALS — BP 123/85 | HR 74 | Temp 97.9°F | Resp 18

## 2018-09-15 DIAGNOSIS — D500 Iron deficiency anemia secondary to blood loss (chronic): Secondary | ICD-10-CM

## 2018-09-15 DIAGNOSIS — D509 Iron deficiency anemia, unspecified: Secondary | ICD-10-CM | POA: Diagnosis not present

## 2018-09-15 DIAGNOSIS — D510 Vitamin B12 deficiency anemia due to intrinsic factor deficiency: Secondary | ICD-10-CM | POA: Diagnosis not present

## 2018-09-15 DIAGNOSIS — Z79899 Other long term (current) drug therapy: Secondary | ICD-10-CM | POA: Diagnosis not present

## 2018-09-15 MED ORDER — SODIUM CHLORIDE 0.9% FLUSH
3.0000 mL | Freq: Once | INTRAVENOUS | Status: DC | PRN
  Filled 2018-09-15: qty 10

## 2018-09-15 MED ORDER — SODIUM CHLORIDE 0.9 % IV SOLN
510.0000 mg | Freq: Once | INTRAVENOUS | Status: AC
  Administered 2018-09-15: 510 mg via INTRAVENOUS
  Filled 2018-09-15: qty 17

## 2018-09-15 MED ORDER — SODIUM CHLORIDE 0.9% FLUSH
10.0000 mL | Freq: Once | INTRAVENOUS | Status: DC | PRN
  Filled 2018-09-15: qty 10

## 2018-09-15 MED ORDER — SODIUM CHLORIDE 0.9 % IV SOLN
Freq: Once | INTRAVENOUS | Status: AC
  Administered 2018-09-15: 13:00:00 via INTRAVENOUS
  Filled 2018-09-15: qty 250

## 2018-11-16 ENCOUNTER — Ambulatory Visit (INDEPENDENT_AMBULATORY_CARE_PROVIDER_SITE_OTHER): Payer: BC Managed Care – PPO | Admitting: Internal Medicine

## 2018-11-16 ENCOUNTER — Encounter: Payer: Self-pay | Admitting: Internal Medicine

## 2018-11-16 DIAGNOSIS — E559 Vitamin D deficiency, unspecified: Secondary | ICD-10-CM | POA: Diagnosis not present

## 2018-11-16 DIAGNOSIS — Z20828 Contact with and (suspected) exposure to other viral communicable diseases: Secondary | ICD-10-CM

## 2018-11-16 DIAGNOSIS — J069 Acute upper respiratory infection, unspecified: Secondary | ICD-10-CM | POA: Diagnosis not present

## 2018-11-16 DIAGNOSIS — E538 Deficiency of other specified B group vitamins: Secondary | ICD-10-CM

## 2018-11-16 DIAGNOSIS — Z20822 Contact with and (suspected) exposure to covid-19: Secondary | ICD-10-CM

## 2018-11-16 NOTE — Assessment & Plan Note (Signed)
Cont w/Vit B12 

## 2018-11-16 NOTE — Assessment & Plan Note (Signed)
Cont Vit D °

## 2018-11-16 NOTE — Progress Notes (Signed)
Virtual Visit via Telephone Note  I connected with Amanda [name] on 11/16/18 at  3:40 PM EDT by telephone and verified that I am speaking with the correct person using two identifiers.   I discussed the limitations, risks, security and privacy concerns of performing an evaluation and management service by telephone and the availability of in person appointments. I also discussed with the patient that there may be a patient responsible charge related to this service. The patient expressed understanding and agreed to proceed.   History of Present Illness: C/o cough x 2-3 d, worse in the daytime, non-productive. OTC meds help.  No fever, chills, SOB, no n/v/d   Observations/Objective:  Pt sounds normal on the phone Assessment and Plan:  See Plan Follow Up Instructions:    I discussed the assessment and treatment plan with the patient. The patient was provided an opportunity to ask questions and all were answered. The patient agreed with the plan and demonstrated an understanding of the instructions.   The patient was advised to call back or seek an in-person evaluation if the symptoms worsen or if the condition fails to improve as anticipated.  I provided 11 minutes of non-face-to-face time during this encounter.   Walker Kehr, MD

## 2018-11-16 NOTE — Assessment & Plan Note (Signed)
COVID19 test Robutussin po

## 2018-12-08 ENCOUNTER — Inpatient Hospital Stay: Payer: BC Managed Care – PPO

## 2018-12-08 ENCOUNTER — Telehealth: Payer: Self-pay | Admitting: Hematology & Oncology

## 2018-12-08 ENCOUNTER — Inpatient Hospital Stay: Payer: BC Managed Care – PPO | Attending: Hematology & Oncology | Admitting: Hematology & Oncology

## 2018-12-08 ENCOUNTER — Other Ambulatory Visit: Payer: Self-pay

## 2018-12-08 ENCOUNTER — Encounter: Payer: Self-pay | Admitting: Hematology & Oncology

## 2018-12-08 VITALS — BP 131/91 | HR 78 | Temp 98.3°F | Resp 16 | Wt 213.0 lb

## 2018-12-08 DIAGNOSIS — D509 Iron deficiency anemia, unspecified: Secondary | ICD-10-CM | POA: Diagnosis not present

## 2018-12-08 DIAGNOSIS — Z1231 Encounter for screening mammogram for malignant neoplasm of breast: Secondary | ICD-10-CM | POA: Diagnosis not present

## 2018-12-08 DIAGNOSIS — D510 Vitamin B12 deficiency anemia due to intrinsic factor deficiency: Secondary | ICD-10-CM | POA: Insufficient documentation

## 2018-12-08 DIAGNOSIS — D500 Iron deficiency anemia secondary to blood loss (chronic): Secondary | ICD-10-CM

## 2018-12-08 DIAGNOSIS — Z79899 Other long term (current) drug therapy: Secondary | ICD-10-CM | POA: Diagnosis not present

## 2018-12-08 DIAGNOSIS — E538 Deficiency of other specified B group vitamins: Secondary | ICD-10-CM

## 2018-12-08 LAB — CMP (CANCER CENTER ONLY)
ALT: 19 U/L (ref 0–44)
AST: 17 U/L (ref 15–41)
Albumin: 4.3 g/dL (ref 3.5–5.0)
Alkaline Phosphatase: 51 U/L (ref 38–126)
Anion gap: 9 (ref 5–15)
BUN: 11 mg/dL (ref 6–20)
CO2: 28 mmol/L (ref 22–32)
Calcium: 9.7 mg/dL (ref 8.9–10.3)
Chloride: 105 mmol/L (ref 98–111)
Creatinine: 0.87 mg/dL (ref 0.44–1.00)
GFR, Est AFR Am: >60 mL/min (ref >60)
GFR, Estimated: >60 mL/min (ref >60)
Glucose, Bld: 90 mg/dL (ref 70–99)
Potassium: 4.4 mmol/L (ref 3.5–5.1)
Sodium: 142 mmol/L (ref 135–145)
Total Bilirubin: 0.4 mg/dL (ref 0.3–1.2)
Total Protein: 7.4 g/dL (ref 6.5–8.1)

## 2018-12-08 LAB — CBC WITH DIFFERENTIAL (CANCER CENTER ONLY)
Abs Immature Granulocytes: 0.03 10*3/uL (ref 0.00–0.07)
Basophils Absolute: 0.1 10*3/uL (ref 0.0–0.1)
Basophils Relative: 1 %
Eosinophils Absolute: 0.2 10*3/uL (ref 0.0–0.5)
Eosinophils Relative: 5 %
HCT: 42.3 % (ref 36.0–46.0)
Hemoglobin: 13.5 g/dL (ref 12.0–15.0)
Immature Granulocytes: 1 %
Lymphocytes Relative: 31 %
Lymphs Abs: 1.5 10*3/uL (ref 0.7–4.0)
MCH: 27.2 pg (ref 26.0–34.0)
MCHC: 31.9 g/dL (ref 30.0–36.0)
MCV: 85.1 fL (ref 80.0–100.0)
Monocytes Absolute: 0.5 10*3/uL (ref 0.1–1.0)
Monocytes Relative: 9 %
Neutro Abs: 2.7 10*3/uL (ref 1.7–7.7)
Neutrophils Relative %: 53 %
Platelet Count: 281 10*3/uL (ref 150–400)
RBC: 4.97 MIL/uL (ref 3.87–5.11)
RDW: 14.2 % (ref 11.5–15.5)
WBC Count: 5 10*3/uL (ref 4.0–10.5)
nRBC: 0 % (ref 0.0–0.2)

## 2018-12-08 LAB — VITAMIN B12: Vitamin B-12: 291 pg/mL (ref 180–914)

## 2018-12-08 NOTE — Progress Notes (Signed)
Hematology and Oncology Follow Up Visit  Amanda [name] [id number] 02/17/1966 53 y.o. 12/08/2018   Principle Diagnosis:  Iron deficiency anemia    Current Therapy:   IV iron as indicated --  Feraheme given on 09/15/2018 (2 doses)   Interim History: Amanda [name] is here today for follow-up.  She is doing pretty well.  She feels a little tired but not as much.  Last got iron back in May.  She got 2 doses of Feraheme.  At that time, her iron levels showed a ferritin less than 4 and iron saturation of only 6%.  She has had no obvious bleeding.  She has not had a gynecologic exam in 5 years.  She probably needs to really have one done.  I know she has had no bleeding in the female area.  She also needs to have a mammogram done.  She has had no fever.  There is been no cough.  She has had no nausea or vomiting.  Overall, her performance status is ECOG 1.   Medications:  Allergies as of 12/08/2018   No Known Allergies     Medication List       Accurate as of December 08, 2018  1:07 PM. If you have any questions, ask your nurse or doctor.        STOP taking these medications   cefdinir 300 MG capsule Commonly known as: OMNICEF Stopped by: Volanda Napoleon, MD   doxycycline 100 MG tablet Commonly known as: VIBRA-TABS Stopped by: Volanda Napoleon, MD   pantoprazole 40 MG tablet Commonly known as: Protonix Stopped by: Volanda Napoleon, MD   predniSONE 20 MG tablet Commonly known as: DELTASONE Stopped by: Volanda Napoleon, MD     TAKE these medications   b complex vitamins tablet Take 1 tablet by mouth daily.   cetirizine 10 MG tablet Commonly known as: ZYRTEC Take 10 mg by mouth daily.   HYDROcodone-homatropine 5-1.5 MG/5ML syrup Commonly known as: HYCODAN Take 5 mLs by mouth every 8 (eight) hours as needed for cough.   metoprolol-hydrochlorothiazide 50-25 MG tablet Commonly known as: Lopressor HCT Take 1 tablet by mouth daily.   traMADol 50 MG tablet Commonly known  as: ULTRAM TAKE 1 TABLET BY MOUTH EVERY 6 HOURS AS NEEDED FOR SEVERE PAIN   Vitamin B-12 1000 MCG Subl Place 1 tablet (1,000 mcg total) under the tongue daily.   Vitamin D3 50 MCG (2000 UT) capsule Take 1 capsule (2,000 Units total) by mouth daily.   zolpidem 5 MG tablet Commonly known as: AMBIEN TAKE 1/2 TO 1 (ONE-HALF TO ONE) TABLET BY MOUTH AT BEDTIME AS NEEDED FOR SLEEP       Allergies: No Known Allergies  Past Medical History, Surgical history, Social history, and Family History were reviewed and updated.  Review of Systems: Physical Exam Vitals signs reviewed.  HENT:     Head: Normocephalic and atraumatic.  Eyes:     Pupils: Pupils are equal, round, and reactive to light.  Neck:     Musculoskeletal: Normal range of motion.  Cardiovascular:     Rate and Rhythm: Normal rate and regular rhythm.     Heart sounds: Normal heart sounds.  Pulmonary:     Effort: Pulmonary effort is normal.     Breath sounds: Normal breath sounds.  Abdominal:     General: Bowel sounds are normal.     Palpations: Abdomen is soft.  Musculoskeletal: Normal range of motion.  General: No tenderness or deformity.  Lymphadenopathy:     Cervical: No cervical adenopathy.  Skin:    General: Skin is warm and dry.     Findings: No erythema or rash.  Neurological:     Mental Status: She is alert and oriented to person, place, and time.  Psychiatric:        Behavior: Behavior normal.        Thought Content: Thought content normal.        Judgment: Judgment normal.    .   Physical Exam:  weight is 213 lb (96.6 kg). Her oral temperature is 98.3 F (36.8 C). Her blood pressure is 131/91 (abnormal) and her pulse is 78. Her respiration is 16 and oxygen saturation is 100%.   Wt Readings from Last 3 Encounters:  12/08/18 213 lb (96.6 kg)  09/01/18 220 lb 0.6 oz (99.8 kg)  06/06/18 213 lb (96.6 kg)    Review of Systems  Constitutional: Negative.   HENT: Negative.   Eyes: Negative.    Respiratory: Negative.   Cardiovascular: Negative.   Gastrointestinal: Negative.   Genitourinary: Negative.   Musculoskeletal: Negative.   Skin: Negative.   Neurological: Negative.   Endo/Heme/Allergies: Negative.   Psychiatric/Behavioral: The patient is nervous/anxious.      Lab Results  Component Value Date   WBC 5.0 12/08/2018   HGB 13.5 12/08/2018   HCT 42.3 12/08/2018   MCV 85.1 12/08/2018   PLT 281 12/08/2018   Lab Results  Component Value Date   FERRITIN <4 (L) 09/01/2018   IRON 24 (L) 09/01/2018   TIBC 425 09/01/2018   UIBC 401 (H) 09/01/2018   IRONPCTSAT 6 (L) 09/01/2018   Lab Results  Component Value Date   RETICCTPCT 1.7 09/01/2018   RBC 4.97 12/08/2018   No results found for: KPAFRELGTCHN, LAMBDASER, KAPLAMBRATIO No results found for: IGGSERUM, IGA, IGMSERUM No results found for: Marda StalkerOTALPROTELP, ALBUMINELP, A1GS, A2GS, BETS, BETA2SER, GAMS, MSPIKE, SPEI   Chemistry      Component Value Date/Time   NA 142 12/08/2018 1147   K 4.4 12/08/2018 1147   CL 105 12/08/2018 1147   CO2 28 12/08/2018 1147   BUN 11 12/08/2018 1147   CREATININE 0.87 12/08/2018 1147      Component Value Date/Time   CALCIUM 9.7 12/08/2018 1147   ALKPHOS 51 12/08/2018 1147   AST 17 12/08/2018 1147   ALT 19 12/08/2018 1147   BILITOT 0.4 12/08/2018 1147       Impression and Plan: Amanda [name] is a pleasant 53 yo [place] female with iron deficiency anemia.  She also has pernicious anemia.  She does take vitamin B-12.  We will see what her iron studies show.  I would like to think that her iron studies will be okay.  I am more worried about her getting her mammogram and also making sure that she has a gynecologic exam.  I will have to make sure her family doctor can help take care of the appointment for her gynecologic exam.  I will see her back in 4 months.  Josph MachoPeter R Athanasius Kesling, MD 8/7/20201:07 PM

## 2018-12-08 NOTE — Telephone Encounter (Signed)
Appointments scheduled per 8/7 LOS. lmom to inform patient

## 2018-12-11 LAB — IRON AND TIBC
Iron: 59 ug/dL (ref 41–142)
Saturation Ratios: 15 % — ABNORMAL LOW (ref 21–57)
TIBC: 400 ug/dL (ref 236–444)
UIBC: 341 ug/dL (ref 120–384)

## 2018-12-11 LAB — FERRITIN: Ferritin: 14 ng/mL (ref 11–307)

## 2018-12-15 ENCOUNTER — Telehealth: Payer: Self-pay | Admitting: Hematology & Oncology

## 2018-12-15 NOTE — Telephone Encounter (Signed)
Called and LM for patient w/ date/time of appointments per 8/14 los

## 2018-12-22 ENCOUNTER — Other Ambulatory Visit: Payer: Self-pay

## 2018-12-22 ENCOUNTER — Inpatient Hospital Stay: Payer: BC Managed Care – PPO

## 2018-12-22 VITALS — BP 123/69 | HR 62 | Temp 97.1°F | Resp 17

## 2018-12-22 DIAGNOSIS — D500 Iron deficiency anemia secondary to blood loss (chronic): Secondary | ICD-10-CM

## 2018-12-22 DIAGNOSIS — D510 Vitamin B12 deficiency anemia due to intrinsic factor deficiency: Secondary | ICD-10-CM | POA: Diagnosis not present

## 2018-12-22 DIAGNOSIS — D509 Iron deficiency anemia, unspecified: Secondary | ICD-10-CM | POA: Diagnosis not present

## 2018-12-22 DIAGNOSIS — Z79899 Other long term (current) drug therapy: Secondary | ICD-10-CM | POA: Diagnosis not present

## 2018-12-22 MED ORDER — SODIUM CHLORIDE 0.9 % IV SOLN
Freq: Once | INTRAVENOUS | Status: AC
  Administered 2018-12-22: 14:00:00 via INTRAVENOUS
  Filled 2018-12-22: qty 250

## 2018-12-22 MED ORDER — SODIUM CHLORIDE 0.9 % IV SOLN
510.0000 mg | Freq: Once | INTRAVENOUS | Status: AC
  Administered 2018-12-22: 14:00:00 510 mg via INTRAVENOUS
  Filled 2018-12-22: qty 510

## 2018-12-22 NOTE — Patient Instructions (Signed)

## 2019-01-07 ENCOUNTER — Other Ambulatory Visit: Payer: Self-pay | Admitting: Internal Medicine

## 2019-01-09 NOTE — Telephone Encounter (Signed)
[place] Controlled Database Checked Last filled: 08/31/18 # 60 LOV w/you:11/16/18 Next appt w/you: None

## 2019-01-16 ENCOUNTER — Other Ambulatory Visit: Payer: Self-pay | Admitting: *Deleted

## 2019-01-16 NOTE — Telephone Encounter (Signed)
01/15/19 Tramadol refill failed to send to pharmacy. Please resend.  

## 2019-01-19 MED ORDER — TRAMADOL HCL 50 MG PO TABS: 50.0000 mg | ORAL_TABLET | Freq: Four times a day (QID) | ORAL | 1 refills | Status: DC | PRN

## 2019-02-21 ENCOUNTER — Other Ambulatory Visit: Payer: Self-pay | Admitting: Internal Medicine

## 2019-02-23 NOTE — Telephone Encounter (Signed)
Please advise about refill in Dr. Plotnikovs absence. 

## 2019-04-13 ENCOUNTER — Inpatient Hospital Stay: Payer: BC Managed Care – PPO

## 2019-04-13 ENCOUNTER — Other Ambulatory Visit: Payer: Self-pay

## 2019-04-13 ENCOUNTER — Encounter: Payer: Self-pay | Admitting: Hematology & Oncology

## 2019-04-13 ENCOUNTER — Inpatient Hospital Stay: Payer: BC Managed Care – PPO | Attending: Hematology & Oncology | Admitting: Hematology & Oncology

## 2019-04-13 VITALS — BP 121/66 | HR 72 | Resp 17

## 2019-04-13 VITALS — BP 134/81 | HR 91 | Temp 97.3°F | Resp 18 | Wt 220.0 lb

## 2019-04-13 DIAGNOSIS — E538 Deficiency of other specified B group vitamins: Secondary | ICD-10-CM | POA: Diagnosis not present

## 2019-04-13 DIAGNOSIS — D509 Iron deficiency anemia, unspecified: Secondary | ICD-10-CM | POA: Diagnosis not present

## 2019-04-13 DIAGNOSIS — D500 Iron deficiency anemia secondary to blood loss (chronic): Secondary | ICD-10-CM

## 2019-04-13 DIAGNOSIS — Z1231 Encounter for screening mammogram for malignant neoplasm of breast: Secondary | ICD-10-CM

## 2019-04-13 LAB — CBC WITH DIFFERENTIAL (CANCER CENTER ONLY)
Abs Immature Granulocytes: 0.06 10*3/uL (ref 0.00–0.07)
Basophils Absolute: 0.1 10*3/uL (ref 0.0–0.1)
Basophils Relative: 1 %
Eosinophils Absolute: 0.2 10*3/uL (ref 0.0–0.5)
Eosinophils Relative: 3 %
HCT: 29.5 % — ABNORMAL LOW (ref 36.0–46.0)
Hemoglobin: 8.7 g/dL — ABNORMAL LOW (ref 12.0–15.0)
Immature Granulocytes: 1 %
Lymphocytes Relative: 32 %
Lymphs Abs: 1.9 10*3/uL (ref 0.7–4.0)
MCH: 22.2 pg — ABNORMAL LOW (ref 26.0–34.0)
MCHC: 29.5 g/dL — ABNORMAL LOW (ref 30.0–36.0)
MCV: 75.3 fL — ABNORMAL LOW (ref 80.0–100.0)
Monocytes Absolute: 0.6 10*3/uL (ref 0.1–1.0)
Monocytes Relative: 10 %
Neutro Abs: 3.2 10*3/uL (ref 1.7–7.7)
Neutrophils Relative %: 53 %
Platelet Count: 375 10*3/uL (ref 150–400)
RBC: 3.92 MIL/uL (ref 3.87–5.11)
RDW: 15.1 % (ref 11.5–15.5)
WBC Count: 6.1 10*3/uL (ref 4.0–10.5)
nRBC: 0 % (ref 0.0–0.2)

## 2019-04-13 LAB — CMP (CANCER CENTER ONLY)
ALT: 16 U/L (ref 0–44)
AST: 14 U/L — ABNORMAL LOW (ref 15–41)
Albumin: 4.7 g/dL (ref 3.5–5.0)
Alkaline Phosphatase: 50 U/L (ref 38–126)
Anion gap: 9 (ref 5–15)
BUN: 16 mg/dL (ref 6–20)
CO2: 27 mmol/L (ref 22–32)
Calcium: 9.4 mg/dL (ref 8.9–10.3)
Chloride: 106 mmol/L (ref 98–111)
Creatinine: 0.95 mg/dL (ref 0.44–1.00)
GFR, Est AFR Am: >60 mL/min (ref >60)
GFR, Estimated: >60 mL/min (ref >60)
Glucose, Bld: 114 mg/dL — ABNORMAL HIGH (ref 70–99)
Potassium: 4.2 mmol/L (ref 3.5–5.1)
Sodium: 142 mmol/L (ref 135–145)
Total Bilirubin: 0.3 mg/dL (ref 0.3–1.2)
Total Protein: 7.6 g/dL (ref 6.5–8.1)

## 2019-04-13 MED ORDER — MELOXICAM 15 MG PO TABS: 15.0000 mg | ORAL_TABLET | Freq: Every day | ORAL | 3 refills | Status: DC

## 2019-04-13 MED ORDER — SODIUM CHLORIDE 0.9 % IV SOLN
Freq: Once | INTRAVENOUS | Status: AC
  Administered 2019-04-13: 12:00:00 via INTRAVENOUS
  Filled 2019-04-13: qty 250

## 2019-04-13 MED ORDER — SODIUM CHLORIDE 0.9 % IV SOLN
510.0000 mg | Freq: Once | INTRAVENOUS | Status: AC
  Administered 2019-04-13: 510 mg via INTRAVENOUS
  Filled 2019-04-13: qty 510

## 2019-04-13 NOTE — Patient Instructions (Signed)

## 2019-04-13 NOTE — Progress Notes (Signed)
Hematology and Oncology Follow Up Visit  Amanda [name] [id number] [birth year] 53 y.o. 04/13/2019   Principle Diagnosis:  Iron deficiency anemia    Current Therapy:   IV iron as indicated --  Feraheme given on 04/13/2019 (2 doses)   Interim History: Amanda [name] is here today for follow-up.  She is feeling quite tired.  She does not have as much energy.  She is complaining of a lot of joint and muscle aches and pains.  She clearly is iron deficient again.  Her hemoglobin is down to 8.7.  We saw her 4 months ago, her hemoglobin was 13.5.  The MCV is now down to 75.  I am not sure what is causing these arthralgias.  I suppose it might be part of the iron deficiency.  She has had no melena or bright red blood per rectum.  She says that she does have some upper abdominal discomfort.  Think she had a colonoscopy about a year ago.  We did do a rectal exam on her.  The stool was brown and heme negative.  She is not chewing ice.  She has had no cough or shortness of breath.  She did have a nice Thanksgiving.  Her sister, had a bone marrow transplant in June at [facility].  She still is there.  She is making a slow recovery.  Overall, her performance  status is ECOG 1.   Medications:  Allergies as of 04/13/2019   No Known Allergies     Medication List       Accurate as of April 13, 2019 12:14 PM. If you have any questions, ask your nurse or doctor.        b complex vitamins tablet Take 1 tablet by mouth daily.   cetirizine 10 MG tablet Commonly known as: ZYRTEC Take 10 mg by mouth daily.   HYDROcodone-homatropine 5-1.5 MG/5ML syrup Commonly known as: HYCODAN Take 5 mLs by mouth every 8 (eight) hours as needed for cough.   metoprolol-hydrochlorothiazide 50-25 MG tablet Commonly known as: Lopressor HCT Take 1 tablet by mouth daily.   traMADol 50 MG tablet Commonly known as: ULTRAM Take 1 tablet (50 mg total) by mouth every 6 (six) hours as needed for severe pain.    Vitamin B-12 1000 MCG Subl Place 1 tablet (1,000 mcg total) under the tongue daily.   Vitamin D3 50 MCG (2000 UT) capsule Take 1 capsule (2,000 Units total) by mouth daily.   zolpidem 5 MG tablet Commonly known as: AMBIEN TAKE 1/2 TO 1 (ONE-HALF TO ONE) TABLET BY MOUTH AT BEDTIME AS NEEDED FOR SLEEP       Allergies: No Known Allergies  Past Medical History, Surgical history, Social history, and Family History were reviewed and updated.  Review of Systems: Physical Exam Vitals reviewed.  HENT:     Head: Normocephalic and atraumatic.  Eyes:     Pupils: Pupils are equal, round, and reactive to light.  Cardiovascular:     Rate and Rhythm: Normal rate and regular rhythm.     Heart sounds: Normal heart sounds.  Pulmonary:     Effort: Pulmonary effort is normal.     Breath sounds: Normal breath sounds.  Abdominal:     General: Bowel sounds are normal.     Palpations: Abdomen is soft.  Musculoskeletal:        General: No tenderness or deformity. Normal range of motion.     Cervical back: Normal range of motion.  Lymphadenopathy:  Cervical: No cervical adenopathy.  Skin:    General: Skin is warm and dry.     Findings: No erythema or rash.  Neurological:     Mental Status: She is alert and oriented to person, place, and time.  Psychiatric:        Behavior: Behavior normal.        Thought Content: Thought content normal.        Judgment: Judgment normal.    .   Physical Exam:  weight is 220 lb (99.8 kg). Her temporal temperature is 97.3 F (36.3 C) (abnormal). Her blood pressure is 134/81 and her pulse is 91. Her respiration is 18 and oxygen saturation is 100%.   Wt Readings from Last 3 Encounters:  04/13/19 220 lb (99.8 kg)  12/08/18 213 lb (96.6 kg)  09/01/18 220 lb 0.6 oz (99.8 kg)    Review of Systems  Constitutional: Negative.   HENT: Negative.   Eyes: Negative.   Respiratory: Negative.   Cardiovascular: Negative.   Gastrointestinal: Negative.    Genitourinary: Negative.   Musculoskeletal: Negative.   Skin: Negative.   Neurological: Negative.   Endo/Heme/Allergies: Negative.   Psychiatric/Behavioral: The patient is nervous/anxious.      Lab Results  Component Value Date   WBC 6.1 04/13/2019   HGB 8.7 (L) 04/13/2019   HCT 29.5 (L) 04/13/2019   MCV 75.3 (L) 04/13/2019   PLT 375 04/13/2019   Lab Results  Component Value Date   FERRITIN 14 12/08/2018   IRON 59 12/08/2018   TIBC 400 12/08/2018   UIBC 341 12/08/2018   IRONPCTSAT 15 (L) 12/08/2018   Lab Results  Component Value Date   RETICCTPCT 1.7 09/01/2018   RBC 3.92 04/13/2019   No results found for: KPAFRELGTCHN, LAMBDASER, KAPLAMBRATIO No results found for: IGGSERUM, IGA, IGMSERUM No results found for: Odetta Pink, SPEI   Chemistry      Component Value Date/Time   NA 142 04/13/2019 1057   K 4.2 04/13/2019 1057   CL 106 04/13/2019 1057   CO2 27 04/13/2019 1057   BUN 16 04/13/2019 1057   CREATININE 0.95 04/13/2019 1057      Component Value Date/Time   CALCIUM 9.4 04/13/2019 1057   ALKPHOS 50 04/13/2019 1057   AST 14 (L) 04/13/2019 1057   ALT 16 04/13/2019 1057   BILITOT 0.3 04/13/2019 1057       Impression and Plan: Ms. Amanda [name] is a pleasant 53 yo [place] female with iron deficiency anemia.  She also has pernicious anemia.  She does take vitamin B-12.  Clearly, her iron is low, we are going to give her iron today.  She probably will need a dose next week.  We will have to also see how things look with her vitamin B-12 level.  We will have to see her back probably in a month or so.  We need to see how her hemoglobin response to the iron.Marland Kitchen  Volanda Napoleon, MD 12/11/202012:14 PM

## 2019-04-16 LAB — IRON AND TIBC
Iron: 11 ug/dL — ABNORMAL LOW (ref 41–142)
Saturation Ratios: 2 % — ABNORMAL LOW (ref 21–57)
TIBC: 501 ug/dL — ABNORMAL HIGH (ref 236–444)
UIBC: 491 ug/dL — ABNORMAL HIGH (ref 120–384)

## 2019-04-16 LAB — FERRITIN: Ferritin: <4 ng/mL — ABNORMAL LOW (ref 11–307)

## 2019-04-20 ENCOUNTER — Inpatient Hospital Stay: Payer: BC Managed Care – PPO

## 2019-04-20 ENCOUNTER — Other Ambulatory Visit: Payer: Self-pay

## 2019-04-20 VITALS — BP 140/80 | HR 81 | Temp 97.3°F | Resp 17

## 2019-04-20 DIAGNOSIS — D500 Iron deficiency anemia secondary to blood loss (chronic): Secondary | ICD-10-CM

## 2019-04-20 DIAGNOSIS — D509 Iron deficiency anemia, unspecified: Secondary | ICD-10-CM | POA: Diagnosis not present

## 2019-04-20 MED ORDER — SODIUM CHLORIDE 0.9 % IV SOLN
510.0000 mg | Freq: Once | INTRAVENOUS | Status: AC
  Administered 2019-04-20: 510 mg via INTRAVENOUS
  Filled 2019-04-20: qty 510

## 2019-04-20 MED ORDER — SODIUM CHLORIDE 0.9 % IV SOLN
Freq: Once | INTRAVENOUS | Status: AC
  Filled 2019-04-20: qty 250

## 2019-04-20 NOTE — Patient Instructions (Signed)

## 2019-04-26 ENCOUNTER — Inpatient Hospital Stay: Payer: BC Managed Care – PPO

## 2019-04-26 ENCOUNTER — Other Ambulatory Visit: Payer: Self-pay

## 2019-04-26 VITALS — BP 106/67 | HR 71 | Temp 98.2°F | Resp 17 | Wt 220.1 lb

## 2019-04-26 DIAGNOSIS — D500 Iron deficiency anemia secondary to blood loss (chronic): Secondary | ICD-10-CM

## 2019-04-26 DIAGNOSIS — D509 Iron deficiency anemia, unspecified: Secondary | ICD-10-CM | POA: Diagnosis not present

## 2019-04-26 MED ORDER — SODIUM CHLORIDE 0.9 % IV SOLN
Freq: Once | INTRAVENOUS | Status: AC
  Filled 2019-04-26: qty 250

## 2019-04-26 MED ORDER — SODIUM CHLORIDE 0.9 % IV SOLN
510.0000 mg | Freq: Once | INTRAVENOUS | Status: AC
  Administered 2019-04-26: 510 mg via INTRAVENOUS
  Filled 2019-04-26: qty 510

## 2019-04-26 NOTE — Patient Instructions (Signed)

## 2019-05-03 ENCOUNTER — Other Ambulatory Visit: Payer: Self-pay

## 2019-05-03 ENCOUNTER — Inpatient Hospital Stay: Payer: BC Managed Care – PPO

## 2019-05-03 VITALS — BP 137/84 | HR 81 | Temp 97.1°F

## 2019-05-03 DIAGNOSIS — D500 Iron deficiency anemia secondary to blood loss (chronic): Secondary | ICD-10-CM

## 2019-05-03 DIAGNOSIS — D509 Iron deficiency anemia, unspecified: Secondary | ICD-10-CM | POA: Diagnosis not present

## 2019-05-03 MED ORDER — SODIUM CHLORIDE 0.9 % IV SOLN
510.0000 mg | Freq: Once | INTRAVENOUS | Status: AC
  Administered 2019-05-03: 510 mg via INTRAVENOUS
  Filled 2019-05-03: qty 17

## 2019-05-03 MED ORDER — SODIUM CHLORIDE 0.9 % IV SOLN
Freq: Once | INTRAVENOUS | Status: AC
  Filled 2019-05-03: qty 250

## 2019-05-03 NOTE — Patient Instructions (Signed)

## 2019-05-18 ENCOUNTER — Ambulatory Visit: Payer: BC Managed Care – PPO | Admitting: Family

## 2019-05-18 ENCOUNTER — Other Ambulatory Visit: Payer: BC Managed Care – PPO

## 2019-08-14 ENCOUNTER — Other Ambulatory Visit: Payer: Self-pay

## 2019-08-14 ENCOUNTER — Other Ambulatory Visit: Payer: Self-pay | Admitting: Internal Medicine

## 2019-08-14 NOTE — Telephone Encounter (Signed)
Duplicate request. 1st requesting waiting on MD approval../lmb

## 2019-08-15 NOTE — Telephone Encounter (Signed)
Needs OV.  

## 2019-08-24 ENCOUNTER — Encounter: Payer: Self-pay | Admitting: Hematology & Oncology

## 2019-08-28 ENCOUNTER — Inpatient Hospital Stay: Payer: BC Managed Care – PPO

## 2019-08-28 ENCOUNTER — Ambulatory Visit: Payer: BC Managed Care – PPO | Admitting: Family

## 2019-08-29 ENCOUNTER — Other Ambulatory Visit: Payer: Self-pay

## 2019-08-29 ENCOUNTER — Inpatient Hospital Stay: Payer: Self-pay | Attending: Family

## 2019-08-29 ENCOUNTER — Inpatient Hospital Stay (HOSPITAL_BASED_OUTPATIENT_CLINIC_OR_DEPARTMENT_OTHER): Payer: Self-pay | Admitting: Family

## 2019-08-29 ENCOUNTER — Encounter: Payer: Self-pay | Admitting: Family

## 2019-08-29 VITALS — BP 134/94 | HR 79 | Temp 97.1°F | Resp 18 | Ht 62.21 in | Wt 218.1 lb

## 2019-08-29 DIAGNOSIS — R200 Anesthesia of skin: Secondary | ICD-10-CM | POA: Insufficient documentation

## 2019-08-29 DIAGNOSIS — E538 Deficiency of other specified B group vitamins: Secondary | ICD-10-CM

## 2019-08-29 DIAGNOSIS — R202 Paresthesia of skin: Secondary | ICD-10-CM | POA: Insufficient documentation

## 2019-08-29 DIAGNOSIS — R002 Palpitations: Secondary | ICD-10-CM | POA: Insufficient documentation

## 2019-08-29 DIAGNOSIS — D500 Iron deficiency anemia secondary to blood loss (chronic): Secondary | ICD-10-CM

## 2019-08-29 DIAGNOSIS — R531 Weakness: Secondary | ICD-10-CM | POA: Insufficient documentation

## 2019-08-29 DIAGNOSIS — D509 Iron deficiency anemia, unspecified: Secondary | ICD-10-CM | POA: Insufficient documentation

## 2019-08-29 LAB — CBC WITH DIFFERENTIAL (CANCER CENTER ONLY)
Abs Immature Granulocytes: 0.13 10*3/uL — ABNORMAL HIGH (ref 0.00–0.07)
Basophils Absolute: 0.1 10*3/uL (ref 0.0–0.1)
Basophils Relative: 1 %
Eosinophils Absolute: 0.2 10*3/uL (ref 0.0–0.5)
Eosinophils Relative: 3 %
HCT: 38.3 % (ref 36.0–46.0)
Hemoglobin: 12.4 g/dL (ref 12.0–15.0)
Immature Granulocytes: 2 %
Lymphocytes Relative: 30 %
Lymphs Abs: 1.7 10*3/uL (ref 0.7–4.0)
MCH: 29 pg (ref 26.0–34.0)
MCHC: 32.4 g/dL (ref 30.0–36.0)
MCV: 89.5 fL (ref 80.0–100.0)
Monocytes Absolute: 0.4 10*3/uL (ref 0.1–1.0)
Monocytes Relative: 7 %
Neutro Abs: 3.1 10*3/uL (ref 1.7–7.7)
Neutrophils Relative %: 57 %
Platelet Count: 285 10*3/uL (ref 150–400)
RBC: 4.28 MIL/uL (ref 3.87–5.11)
RDW: 12.9 % (ref 11.5–15.5)
WBC Count: 5.6 10*3/uL (ref 4.0–10.5)
nRBC: 0 % (ref 0.0–0.2)

## 2019-08-29 LAB — IRON AND TIBC
Iron: 77 ug/dL (ref 41–142)
Saturation Ratios: 20 % — ABNORMAL LOW (ref 21–57)
TIBC: 376 ug/dL (ref 236–444)
UIBC: 299 ug/dL (ref 120–384)

## 2019-08-29 LAB — CMP (CANCER CENTER ONLY)
ALT: 24 U/L (ref 0–44)
AST: 15 U/L (ref 15–41)
Albumin: 4.6 g/dL (ref 3.5–5.0)
Alkaline Phosphatase: 49 U/L (ref 38–126)
Anion gap: 9 (ref 5–15)
BUN: 23 mg/dL — ABNORMAL HIGH (ref 6–20)
CO2: 27 mmol/L (ref 22–32)
Calcium: 9.9 mg/dL (ref 8.9–10.3)
Chloride: 106 mmol/L (ref 98–111)
Creatinine: 1.05 mg/dL — ABNORMAL HIGH (ref 0.44–1.00)
GFR, Est AFR Am: >60 mL/min (ref >60)
GFR, Estimated: >60 mL/min (ref >60)
Glucose, Bld: 111 mg/dL — ABNORMAL HIGH (ref 70–99)
Potassium: 4.7 mmol/L (ref 3.5–5.1)
Sodium: 142 mmol/L (ref 135–145)
Total Bilirubin: 0.4 mg/dL (ref 0.3–1.2)
Total Protein: 7.6 g/dL (ref 6.5–8.1)

## 2019-08-29 LAB — RETICULOCYTES
Immature Retic Fract: 11.4 % (ref 2.3–15.9)
RBC.: 4.31 MIL/uL (ref 3.87–5.11)
Retic Count, Absolute: 108.6 10*3/uL (ref 19.0–186.0)
Retic Ct Pct: 2.5 % (ref 0.4–3.1)

## 2019-08-29 LAB — FERRITIN: Ferritin: 30 ng/mL (ref 11–307)

## 2019-08-29 LAB — SAVE SMEAR(SSMR), FOR PROVIDER SLIDE REVIEW

## 2019-08-29 LAB — VITAMIN B12: Vitamin B-12: 206 pg/mL (ref 180–914)

## 2019-08-29 NOTE — Progress Notes (Signed)
Hematology and Oncology Follow Up Visit  Amanda [name] [id number] 1966/02/13 54 y.o. 08/29/2019   Principle Diagnosis:  Iron deficiency anemia    Current Therapy:        IV iron as indicated   Interim History:  Amanda [name] is here today for follow-up. She received IV iron in December and felt better for several months. She is now starting to note increasing fatigue, weakness and occasional palpitations.  She has not noted any blood loss. No bruising or petechiae.  She has occasional numbness and tingling in her hands that comes and goes. It is often more noticeable in the left hand.  No swelling or tenderness in her extremities at this time.  She has trouble sleeping due to generalized aches and pains once she lays down. She takes ambien to help her sleep.  No fever, chills, n/v, cough, rash, dizziness, SOB, chest pain, abdominal pain or changes in bowel or bladder habits. No falls or syncopal episodes to report.  She has maintained a good appetite and is staying well hydrated. Her weight is stable.   ECOG Performance Status: 1 - Symptomatic but completely ambulatory  Medications:  Allergies as of 08/29/2019   No Known Allergies     Medication List       Accurate as of August 29, 2019  8:54 AM. If you have any questions, ask your nurse or doctor.        b complex vitamins tablet Take 1 tablet by mouth daily.   cetirizine 10 MG tablet Commonly known as: ZYRTEC Take 10 mg by mouth daily.   HYDROcodone-homatropine 5-1.5 MG/5ML syrup Commonly known as: HYCODAN Take 5 mLs by mouth every 8 (eight) hours as needed for cough.   meloxicam 15 MG tablet Commonly known as: Mobic Take 1 tablet (15 mg total) by mouth daily. Take right after dinner.   metoprolol-hydrochlorothiazide 50-25 MG tablet Commonly known as: Lopressor HCT Take 1 tablet by mouth daily.   traMADol 50 MG tablet Commonly known as: ULTRAM Take 1 tablet (50 mg total) by mouth every 6 (six) hours as needed for  severe pain.   Vitamin B-12 1000 MCG Subl Place 1 tablet (1,000 mcg total) under the tongue daily.   Vitamin D3 50 MCG (2000 UT) capsule Take 1 capsule (2,000 Units total) by mouth daily.   zolpidem 5 MG tablet Commonly known as: AMBIEN TAKE 1/2 TO 1 (ONE-HALF TO ONE) TABLET BY MOUTH AT BEDTIME AS NEEDED FOR SLEEP       Allergies: No Known Allergies  Past Medical History, Surgical history, Social history, and Family History were reviewed and updated.  Review of Systems: All other 10 point review of systems is negative.   Physical Exam:  vitals were not taken for this visit.   Wt Readings from Last 3 Encounters:  04/26/19 220 lb 1.9 oz (99.8 kg)  04/13/19 220 lb (99.8 kg)  12/08/18 213 lb (96.6 kg)    Ocular: Sclerae unicteric, pupils equal, round and reactive to light Ear-nose-throat: Oropharynx clear, dentition fair Lymphatic: No cervical or supraclavicular adenopathy Lungs no rales or rhonchi, good excursion bilaterally Heart regular rate and rhythm, no murmur appreciated Abd soft, nontender, positive bowel sounds, no liver or spleen tip palpated on exam, no fluid wave  MSK no focal spinal tenderness, no joint edema Neuro: non-focal, well-oriented, appropriate affect Breasts: Deferred   Lab Results  Component Value Date   WBC 6.1 04/13/2019   HGB 8.7 (L) 04/13/2019   HCT 29.5 (L) 04/13/2019  MCV 75.3 (L) 04/13/2019   PLT 375 04/13/2019   Lab Results  Component Value Date   FERRITIN <4 (L) 04/13/2019   IRON 11 (L) 04/13/2019   TIBC 501 (H) 04/13/2019   UIBC 491 (H) 04/13/2019   IRONPCTSAT 2 (L) 04/13/2019   Lab Results  Component Value Date   RETICCTPCT 1.7 09/01/2018   RBC 3.92 04/13/2019   No results found for: KPAFRELGTCHN, LAMBDASER, KAPLAMBRATIO No results found for: IGGSERUM, IGA, IGMSERUM No results found for: Odetta Pink, SPEI   Chemistry      Component Value Date/Time   NA 142  04/13/2019 1057   K 4.2 04/13/2019 1057   CL 106 04/13/2019 1057   CO2 27 04/13/2019 1057   BUN 16 04/13/2019 1057   CREATININE 0.95 04/13/2019 1057      Component Value Date/Time   CALCIUM 9.4 04/13/2019 1057   ALKPHOS 50 04/13/2019 1057   AST 14 (L) 04/13/2019 1057   ALT 16 04/13/2019 1057   BILITOT 0.3 04/13/2019 1057       Impression and Plan: Amanda [name] is a pleasant 54 yo [place] female with iron deficiency anemia. She received 4 doses of IV iron and her Hgb is now up to 12.4, MCV 89 and platelet count 285.  She is symptomatic as mentioned above.  We will see what her iron studies show and bring her back in for replacement if needed.  We will go ahead and plan to see her back in another 4 months.  She will contact our office with any questions or concerns. We can certainly see him sooner if needed.   Laverna Peace, NP 4/28/20218:54 AM

## 2019-09-07 ENCOUNTER — Inpatient Hospital Stay: Payer: Self-pay | Attending: Family

## 2019-09-07 ENCOUNTER — Other Ambulatory Visit: Payer: Self-pay

## 2019-09-07 VITALS — BP 113/76 | HR 77 | Temp 97.5°F | Resp 18

## 2019-09-07 DIAGNOSIS — D500 Iron deficiency anemia secondary to blood loss (chronic): Secondary | ICD-10-CM

## 2019-09-07 DIAGNOSIS — D509 Iron deficiency anemia, unspecified: Secondary | ICD-10-CM | POA: Insufficient documentation

## 2019-09-07 MED ORDER — SODIUM CHLORIDE 0.9 % IV SOLN
Freq: Once | INTRAVENOUS | Status: AC
  Filled 2019-09-07: qty 250

## 2019-09-07 MED ORDER — SODIUM CHLORIDE 0.9 % IV SOLN
510.0000 mg | Freq: Once | INTRAVENOUS | Status: AC
  Administered 2019-09-07: 510 mg via INTRAVENOUS
  Filled 2019-09-07: qty 510

## 2019-09-07 NOTE — Progress Notes (Signed)
Pt declined to stay for the 30 minute post infusion observation period. Pt stated she has tolerated medication prior and has no concerns. Pt aware to call clinic with any questions or concerns. Pt verbalized understanding and had no further questions.

## 2019-12-28 ENCOUNTER — Encounter: Payer: Self-pay | Admitting: Family

## 2019-12-28 ENCOUNTER — Other Ambulatory Visit: Payer: Self-pay

## 2019-12-28 ENCOUNTER — Other Ambulatory Visit: Payer: Self-pay | Admitting: Family

## 2019-12-28 ENCOUNTER — Inpatient Hospital Stay: Payer: Self-pay | Attending: Family

## 2019-12-28 ENCOUNTER — Inpatient Hospital Stay (HOSPITAL_BASED_OUTPATIENT_CLINIC_OR_DEPARTMENT_OTHER): Payer: Self-pay | Admitting: Family

## 2019-12-28 VITALS — BP 118/74 | HR 88 | Temp 98.1°F | Resp 19 | Ht 62.0 in | Wt 226.0 lb

## 2019-12-28 DIAGNOSIS — E538 Deficiency of other specified B group vitamins: Secondary | ICD-10-CM

## 2019-12-28 DIAGNOSIS — D500 Iron deficiency anemia secondary to blood loss (chronic): Secondary | ICD-10-CM

## 2019-12-28 DIAGNOSIS — K921 Melena: Secondary | ICD-10-CM

## 2019-12-28 DIAGNOSIS — D509 Iron deficiency anemia, unspecified: Secondary | ICD-10-CM | POA: Insufficient documentation

## 2019-12-28 LAB — CBC WITH DIFFERENTIAL (CANCER CENTER ONLY)
Abs Immature Granulocytes: 0.21 10*3/uL — ABNORMAL HIGH (ref 0.00–0.07)
Basophils Absolute: 0.1 10*3/uL (ref 0.0–0.1)
Basophils Relative: 1 %
Eosinophils Absolute: 0.3 10*3/uL (ref 0.0–0.5)
Eosinophils Relative: 3 %
HCT: 31.2 % — ABNORMAL LOW (ref 36.0–46.0)
Hemoglobin: 9.6 g/dL — ABNORMAL LOW (ref 12.0–15.0)
Immature Granulocytes: 3 %
Lymphocytes Relative: 23 %
Lymphs Abs: 1.8 10*3/uL (ref 0.7–4.0)
MCH: 25 pg — ABNORMAL LOW (ref 26.0–34.0)
MCHC: 30.8 g/dL (ref 30.0–36.0)
MCV: 81.3 fL (ref 80.0–100.0)
Monocytes Absolute: 0.6 10*3/uL (ref 0.1–1.0)
Monocytes Relative: 8 %
Neutro Abs: 4.6 10*3/uL (ref 1.7–7.7)
Neutrophils Relative %: 62 %
Platelet Count: 320 10*3/uL (ref 150–400)
RBC: 3.84 MIL/uL — ABNORMAL LOW (ref 3.87–5.11)
RDW: 14.4 % (ref 11.5–15.5)
WBC Count: 7.6 10*3/uL (ref 4.0–10.5)
nRBC: 0 % (ref 0.0–0.2)

## 2019-12-28 LAB — CMP (CANCER CENTER ONLY)
ALT: 20 U/L (ref 0–44)
AST: 16 U/L (ref 15–41)
Albumin: 4.5 g/dL (ref 3.5–5.0)
Alkaline Phosphatase: 52 U/L (ref 38–126)
Anion gap: 11 (ref 5–15)
BUN: 20 mg/dL (ref 6–20)
CO2: 28 mmol/L (ref 22–32)
Calcium: 10.2 mg/dL (ref 8.9–10.3)
Chloride: 105 mmol/L (ref 98–111)
Creatinine: 1 mg/dL (ref 0.44–1.00)
GFR, Est AFR Am: >60 mL/min (ref >60)
GFR, Estimated: >60 mL/min (ref >60)
Glucose, Bld: 118 mg/dL — ABNORMAL HIGH (ref 70–99)
Potassium: 4.6 mmol/L (ref 3.5–5.1)
Sodium: 144 mmol/L (ref 135–145)
Total Bilirubin: 0.3 mg/dL (ref 0.3–1.2)
Total Protein: 7.6 g/dL (ref 6.5–8.1)

## 2019-12-28 LAB — RETICULOCYTES
Immature Retic Fract: 28.6 % — ABNORMAL HIGH (ref 2.3–15.9)
RBC.: 3.84 MIL/uL — ABNORMAL LOW (ref 3.87–5.11)
Retic Count, Absolute: 103.3 10*3/uL (ref 19.0–186.0)
Retic Ct Pct: 2.7 % (ref 0.4–3.1)

## 2019-12-28 LAB — IRON AND TIBC
Iron: 21 ug/dL — ABNORMAL LOW (ref 41–142)
Saturation Ratios: 4 % — ABNORMAL LOW (ref 21–57)
TIBC: 505 ug/dL — ABNORMAL HIGH (ref 236–444)
UIBC: 484 ug/dL — ABNORMAL HIGH (ref 120–384)

## 2019-12-28 LAB — FERRITIN: Ferritin: 4 ng/mL — ABNORMAL LOW (ref 11–307)

## 2019-12-28 LAB — SAVE SMEAR(SSMR), FOR PROVIDER SLIDE REVIEW

## 2019-12-28 LAB — VITAMIN B12: Vitamin B-12: 181 pg/mL (ref 180–914)

## 2019-12-28 NOTE — Progress Notes (Signed)
Hematology and Oncology Follow Up Visit  Kathi Dohn [id number] 1965-09-26 54 y.o. 12/28/2019   Principle Diagnosis:  Iron deficiency anemia   Current Therapy: IV iron as indicated   Interim History:  Ms. Blumenthal is here today for follow-up. She is feeling fatigued and has also noted SOB and palpitations with exertion.  She has noted a few episodes of dark stool. No obvious blood loss. No bruising or petechiae.  No fever, chills, cough, rash, dizziness, abdominal pain or change in bowel or bladder habits.  She has occasional n/v with a headache.  No swelling, tenderness, numbness or tingling in her extremities at this time. She has numbness and tingling in her hands when she first wakes up in the mornings and once she gets up this goes away.  No falls or syncopal episodes to report.  She has maintained a good appetite and is staying well hydrated. Her weight is stable.   ECOG Performance Status: 1 - Symptomatic but completely ambulatory  Medications:  Allergies as of 12/28/2019   No Known Allergies     Medication List       Accurate as of December 28, 2019  9:02 AM. If you have any questions, ask your nurse or doctor.        b complex vitamins tablet Take 1 tablet by mouth daily.   cetirizine 10 MG tablet Commonly known as: ZYRTEC Take 10 mg by mouth daily.   HYDROcodone-homatropine 5-1.5 MG/5ML syrup Commonly known as: HYCODAN Take 5 mLs by mouth every 8 (eight) hours as needed for cough.   meloxicam 15 MG tablet Commonly known as: Mobic Take 1 tablet (15 mg total) by mouth daily. Take right after dinner.   traMADol 50 MG tablet Commonly known as: ULTRAM Take 1 tablet (50 mg total) by mouth every 6 (six) hours as needed for severe pain.   Vitamin B-12 1000 MCG Subl Place 1 tablet (1,000 mcg total) under the tongue daily.   Vitamin D3 50 MCG (2000 UT) capsule Take 1 capsule (2,000 Units total) by mouth daily.   zolpidem 5 MG tablet Commonly known as:  AMBIEN TAKE 1/2 TO 1 (ONE-HALF TO ONE) TABLET BY MOUTH AT BEDTIME AS NEEDED FOR SLEEP       Allergies: No Known Allergies  Past Medical History, Surgical history, Social history, and Family History were reviewed and updated.  Review of Systems: All other 10 point review of systems is negative.   Physical Exam:  vitals were not taken for this visit.   Wt Readings from Last 3 Encounters:  08/29/19 218 lb 1.9 oz (98.9 kg)  04/26/19 220 lb 1.9 oz (99.8 kg)  04/13/19 220 lb (99.8 kg)    Ocular: Sclerae unicteric, pupils equal, round and reactive to light Ear-nose-throat: Oropharynx clear, dentition fair Lymphatic: No cervical or supraclavicular adenopathy Lungs no rales or rhonchi, good excursion bilaterally Heart regular rate and rhythm, no murmur appreciated Abd soft, nontender, positive bowel sounds, no liver or spleen tip palpated on exam, no fluid wave  MSK no focal spinal tenderness, no joint edema Neuro: non-focal, well-oriented, appropriate affect Breasts: Deferred   Lab Results  Component Value Date   WBC 7.6 12/28/2019   HGB 9.6 (L) 12/28/2019   HCT 31.2 (L) 12/28/2019   MCV 81.3 12/28/2019   PLT 320 12/28/2019   Lab Results  Component Value Date   FERRITIN 30 08/29/2019   IRON 77 08/29/2019   TIBC 376 08/29/2019   UIBC 299 08/29/2019   IRONPCTSAT 20 (  L) 08/29/2019   Lab Results  Component Value Date   RETICCTPCT 2.7 12/28/2019   RBC 3.84 (L) 12/28/2019   No results found for: KPAFRELGTCHN, LAMBDASER, KAPLAMBRATIO No results found for: IGGSERUM, IGA, IGMSERUM No results found for: Marda Stalker, SPEI   Chemistry      Component Value Date/Time   NA 142 08/29/2019 0843   K 4.7 08/29/2019 0843   CL 106 08/29/2019 0843   CO2 27 08/29/2019 0843   BUN 23 (H) 08/29/2019 0843   CREATININE 1.05 (H) 08/29/2019 0843      Component Value Date/Time   CALCIUM 9.9 08/29/2019 0843   ALKPHOS 49 08/29/2019  0843   AST 15 08/29/2019 0843   ALT 24 08/29/2019 0843   BILITOT 0.4 08/29/2019 0843       Impression and Plan: Ms. Nolden is a pleasant 54 yo [place] female with iron deficiency anemia.  I gave her hemoccult cards and a hat and explained how to use. She verbalized understanding and will bring back in when complete.  We will see what her iron studies look like and replace if needed.  We will go ahead and plan to see her again in 3 months.  She can contact our office with any questions or concerns.   Emeline Gins, NP 8/27/20219:02 AM

## 2019-12-30 ENCOUNTER — Other Ambulatory Visit: Payer: Self-pay | Admitting: Internal Medicine

## 2020-01-01 ENCOUNTER — Telehealth: Payer: Self-pay | Admitting: Hematology & Oncology

## 2020-01-01 NOTE — Telephone Encounter (Signed)
Patient has been notified of appointments scheduled per 8/30 sch msg

## 2020-01-03 ENCOUNTER — Inpatient Hospital Stay: Payer: 59 | Attending: Family

## 2020-01-03 ENCOUNTER — Other Ambulatory Visit: Payer: Self-pay

## 2020-01-03 VITALS — BP 121/84 | HR 92 | Temp 97.9°F | Resp 17

## 2020-01-03 DIAGNOSIS — D509 Iron deficiency anemia, unspecified: Secondary | ICD-10-CM | POA: Insufficient documentation

## 2020-01-03 DIAGNOSIS — D500 Iron deficiency anemia secondary to blood loss (chronic): Secondary | ICD-10-CM

## 2020-01-03 LAB — OCCULT BLOOD X 1 CARD TO LAB, STOOL
Fecal Occult Bld: POSITIVE — AB
Fecal Occult Bld: POSITIVE — AB
Fecal Occult Bld: POSITIVE — AB

## 2020-01-03 MED ORDER — SODIUM CHLORIDE 0.9 % IV SOLN
Freq: Once | INTRAVENOUS | Status: AC
  Filled 2020-01-03: qty 250

## 2020-01-03 MED ORDER — SODIUM CHLORIDE 0.9 % IV SOLN
510.0000 mg | Freq: Once | INTRAVENOUS | Status: AC
  Administered 2020-01-03: 510 mg via INTRAVENOUS
  Filled 2020-01-03: qty 510

## 2020-01-03 NOTE — Patient Instructions (Signed)

## 2020-01-04 ENCOUNTER — Encounter: Payer: Self-pay | Admitting: *Deleted

## 2020-01-04 ENCOUNTER — Other Ambulatory Visit: Payer: Self-pay | Admitting: Family

## 2020-01-04 DIAGNOSIS — K921 Melena: Secondary | ICD-10-CM

## 2020-01-11 ENCOUNTER — Other Ambulatory Visit: Payer: Self-pay

## 2020-01-11 ENCOUNTER — Inpatient Hospital Stay: Payer: 59

## 2020-01-11 VITALS — BP 124/76 | HR 84 | Temp 98.6°F | Resp 17

## 2020-01-11 DIAGNOSIS — D500 Iron deficiency anemia secondary to blood loss (chronic): Secondary | ICD-10-CM

## 2020-01-11 DIAGNOSIS — D509 Iron deficiency anemia, unspecified: Secondary | ICD-10-CM | POA: Diagnosis not present

## 2020-01-11 MED ORDER — SODIUM CHLORIDE 0.9 % IV SOLN
510.0000 mg | Freq: Once | INTRAVENOUS | Status: AC
  Administered 2020-01-11: 510 mg via INTRAVENOUS
  Filled 2020-01-11: qty 510

## 2020-01-11 MED ORDER — SODIUM CHLORIDE 0.9 % IV SOLN
Freq: Once | INTRAVENOUS | Status: AC
  Filled 2020-01-11: qty 250

## 2020-01-11 NOTE — Patient Instructions (Signed)

## 2020-03-21 ENCOUNTER — Inpatient Hospital Stay: Payer: 59

## 2020-03-21 ENCOUNTER — Inpatient Hospital Stay: Payer: 59 | Attending: Family | Admitting: Hematology & Oncology

## 2020-03-21 ENCOUNTER — Telehealth: Payer: Self-pay

## 2020-03-21 ENCOUNTER — Other Ambulatory Visit: Payer: Self-pay

## 2020-03-21 ENCOUNTER — Encounter: Payer: Self-pay | Admitting: Hematology & Oncology

## 2020-03-21 VITALS — BP 132/83 | HR 84 | Temp 97.8°F | Resp 17 | Wt 233.0 lb

## 2020-03-21 DIAGNOSIS — D509 Iron deficiency anemia, unspecified: Secondary | ICD-10-CM | POA: Diagnosis not present

## 2020-03-21 DIAGNOSIS — D500 Iron deficiency anemia secondary to blood loss (chronic): Secondary | ICD-10-CM

## 2020-03-21 LAB — IRON AND TIBC
Iron: 27 ug/dL — ABNORMAL LOW (ref 41–142)
Saturation Ratios: 6 % — ABNORMAL LOW (ref 21–57)
TIBC: 460 ug/dL — ABNORMAL HIGH (ref 236–444)
UIBC: 433 ug/dL — ABNORMAL HIGH (ref 120–384)

## 2020-03-21 LAB — RETICULOCYTES
Immature Retic Fract: 25.4 % — ABNORMAL HIGH (ref 2.3–15.9)
RBC.: 4.27 MIL/uL (ref 3.87–5.11)
Retic Count, Absolute: 88.8 10*3/uL (ref 19.0–186.0)
Retic Ct Pct: 2.1 % (ref 0.4–3.1)

## 2020-03-21 LAB — CBC WITH DIFFERENTIAL (CANCER CENTER ONLY)
Abs Immature Granulocytes: 0.12 10*3/uL — ABNORMAL HIGH (ref 0.00–0.07)
Basophils Absolute: 0.1 10*3/uL (ref 0.0–0.1)
Basophils Relative: 1 %
Eosinophils Absolute: 0.3 10*3/uL (ref 0.0–0.5)
Eosinophils Relative: 4 %
HCT: 34.9 % — ABNORMAL LOW (ref 36.0–46.0)
Hemoglobin: 10.9 g/dL — ABNORMAL LOW (ref 12.0–15.0)
Immature Granulocytes: 2 %
Lymphocytes Relative: 28 %
Lymphs Abs: 1.6 10*3/uL (ref 0.7–4.0)
MCH: 25.7 pg — ABNORMAL LOW (ref 26.0–34.0)
MCHC: 31.2 g/dL (ref 30.0–36.0)
MCV: 82.3 fL (ref 80.0–100.0)
Monocytes Absolute: 0.5 10*3/uL (ref 0.1–1.0)
Monocytes Relative: 9 %
Neutro Abs: 3.3 10*3/uL (ref 1.7–7.7)
Neutrophils Relative %: 56 %
Platelet Count: 310 10*3/uL (ref 150–400)
RBC: 4.24 MIL/uL (ref 3.87–5.11)
RDW: 15.7 % — ABNORMAL HIGH (ref 11.5–15.5)
WBC Count: 5.9 10*3/uL (ref 4.0–10.5)
nRBC: 0 % (ref 0.0–0.2)

## 2020-03-21 LAB — CMP (CANCER CENTER ONLY)
ALT: 20 U/L (ref 0–44)
AST: 15 U/L (ref 15–41)
Albumin: 4.4 g/dL (ref 3.5–5.0)
Alkaline Phosphatase: 51 U/L (ref 38–126)
Anion gap: 10 (ref 5–15)
BUN: 14 mg/dL (ref 6–20)
CO2: 28 mmol/L (ref 22–32)
Calcium: 10 mg/dL (ref 8.9–10.3)
Chloride: 104 mmol/L (ref 98–111)
Creatinine: 0.93 mg/dL (ref 0.44–1.00)
GFR, Estimated: >60 mL/min (ref >60)
Glucose, Bld: 123 mg/dL — ABNORMAL HIGH (ref 70–99)
Potassium: 4.3 mmol/L (ref 3.5–5.1)
Sodium: 142 mmol/L (ref 135–145)
Total Bilirubin: 0.2 mg/dL — ABNORMAL LOW (ref 0.3–1.2)
Total Protein: 7.7 g/dL (ref 6.5–8.1)

## 2020-03-21 LAB — FERRITIN: Ferritin: 6 ng/mL — ABNORMAL LOW (ref 11–307)

## 2020-03-21 NOTE — Telephone Encounter (Signed)
Called pt per 03/21/20 los and she is aware of her f/u appt    AOM

## 2020-03-21 NOTE — Progress Notes (Signed)
Hematology and Oncology Follow Up Visit  Amanda [name] [id number] November 25, 1965 54 y.o. 03/21/2020   Principle Diagnosis:  Iron deficiency anemia   Current Therapy: IV iron as indicated   Interim History:  Ms. Amanda [name] is here today for follow-up.  She is feeling okay.  She says that her stools on occasion black.  She did do guaiac cards fourth before.  Also cards are positive.  Her last upper endoscopy and colonoscopy were in June 2019.  Nothing really was noted.  Again I am sure she probably is having bleeding from the small bowel.  I am sure that she has had a capsule endoscopy at some point.  The last time that we saw her back in August, her ferritin was only 4 with an iron saturation of 4%  We may have to see him back given her iron dextran.  We give a lot more iron through the iron dextran.  I do think that this might be better off if we gave her the iron dextran.  I realize that she would be here a lot longer but yet it could certainly help her keep her iron levels up.  She is spending time with her grandchildren.  She is helping to babysit them.  She has had no problems with chewing ice.  There is no fever.  She has had no cough.  She has had no leg swelling.  There is been no rashes.  Overall, her performance status is ECOG 1.   Medications:  Allergies as of 03/21/2020   No Known Allergies     Medication List       Accurate as of March 21, 2020  9:14 AM. If you have any questions, ask your nurse or doctor.        STOP taking these medications   HYDROcodone-homatropine 5-1.5 MG/5ML syrup Commonly known as: HYCODAN Stopped by: [name], MD     TAKE these medications   b complex vitamins tablet Take 1 tablet by mouth daily.   cetirizine 10 MG tablet Commonly known as: ZYRTEC Take 10 mg by mouth daily.   meloxicam 15 MG tablet Commonly known as: Mobic Take 1 tablet (15 mg total) by mouth daily. Take right after dinner.   traMADol 50 MG  tablet Commonly known as: ULTRAM TAKE 1 TABLET BY MOUTH EVERY 6 HOURS AS NEEDED FOR SEVERE PAIN   Vitamin B-12 1000 MCG Subl Place 1 tablet (1,000 mcg total) under the tongue daily.   Vitamin D3 50 MCG (2000 UT) capsule Take 1 capsule (2,000 Units total) by mouth daily.   zolpidem 5 MG tablet Commonly known as: AMBIEN TAKE 1/2 TO 1 (ONE-HALF TO ONE) TABLET BY MOUTH ONCE DAILY AT BEDTIME AS NEEDED FOR SLEEP       Allergies: No Known Allergies  Past Medical History, Surgical history, Social history, and Family History were reviewed and updated.  Review of Systems: Review of Systems  Constitutional: Positive for malaise/fatigue.  HENT: Negative.   Eyes: Negative.   Respiratory: Negative.   Cardiovascular: Negative.   Gastrointestinal: Positive for blood in stool.  Genitourinary: Negative.   Musculoskeletal: Positive for joint pain and myalgias.  Skin: Negative.   Neurological: Negative.   Endo/Heme/Allergies: Negative.   Psychiatric/Behavioral: Negative.      Physical Exam:  vitals were not taken for this visit.   Wt Readings from Last 3 Encounters:  12/28/19 226 lb (102.5 kg)  08/29/19 218 lb 1.9 oz (98.9 kg)  04/26/19 220 lb 1.9 oz (  99.8 kg)    Her vital signs show temperature of 97.8.  Pulse 84.  Blood pressure 132/82.  Weight is 233 pounds.  Physical Exam Vitals reviewed.  HENT:     Head: Normocephalic and atraumatic.  Eyes:     Pupils: Pupils are equal, round, and reactive to light.  Cardiovascular:     Rate and Rhythm: Normal rate and regular rhythm.     Heart sounds: Normal heart sounds.  Pulmonary:     Effort: Pulmonary effort is normal.     Breath sounds: Normal breath sounds.  Abdominal:     General: Bowel sounds are normal.     Palpations: Abdomen is soft.  Musculoskeletal:        General: No tenderness or deformity. Normal range of motion.     Cervical back: Normal range of motion.  Lymphadenopathy:     Cervical: No cervical adenopathy.   Skin:    General: Skin is warm and dry.     Findings: No erythema or rash.  Neurological:     Mental Status: She is alert and oriented to person, place, and time.  Psychiatric:        Behavior: Behavior normal.        Thought Content: Thought content normal.        Judgment: Judgment normal.      Lab Results  Component Value Date   WBC 5.9 03/21/2020   HGB 10.9 (L) 03/21/2020   HCT 34.9 (L) 03/21/2020   MCV 82.3 03/21/2020   PLT 310 03/21/2020   Lab Results  Component Value Date   FERRITIN 4 (L) 12/28/2019   IRON 21 (L) 12/28/2019   TIBC 505 (H) 12/28/2019   UIBC 484 (H) 12/28/2019   IRONPCTSAT 4 (L) 12/28/2019   Lab Results  Component Value Date   RETICCTPCT 2.1 03/21/2020   RBC 4.24 03/21/2020   No results found for: KPAFRELGTCHN, LAMBDASER, KAPLAMBRATIO No results found for: IGGSERUM, IGA, IGMSERUM No results found for: Marda Stalker, SPEI   Chemistry      Component Value Date/Time   NA 144 12/28/2019 0833   K 4.6 12/28/2019 0833   CL 105 12/28/2019 0833   CO2 28 12/28/2019 0833   BUN 20 12/28/2019 0833   CREATININE 1.00 12/28/2019 0833      Component Value Date/Time   CALCIUM 10.2 12/28/2019 0833   ALKPHOS 52 12/28/2019 0833   AST 16 12/28/2019 0833   ALT 20 12/28/2019 0833   BILITOT 0.3 12/28/2019 0833       Impression and Plan: Ms. Amanda [name] is a pleasant 54 yo [place] female with iron deficiency anemia.  She also is having GI bleeding.  Again is hard to say where this could be coming from.  I would have to think that the bleeding would be coming from the small bowel.  We will see what her iron studies are today.  Hemoglobin is not as low so this is certainly encouraging.  I did talk to her about using iron dextran.  I think this might not be a bad idea.  I would like to get her back here to see [place] in 2 months.  In the interim, I am sure we will give her iron.   [name],  MD 11/19/20219:14 AM

## 2020-03-24 NOTE — Addendum Note (Signed)
Addended by: Arlan Organ R on: 03/24/2020 05:16 PM   Modules accepted: Orders

## 2020-03-25 ENCOUNTER — Other Ambulatory Visit: Payer: Self-pay | Admitting: Family

## 2020-03-26 ENCOUNTER — Telehealth: Payer: Self-pay | Admitting: Hematology & Oncology

## 2020-03-26 NOTE — Telephone Encounter (Signed)
I called and LMVM for patient regarding infusion appt scheduled for 12/3. Per 11/23 sch msg

## 2020-04-04 ENCOUNTER — Inpatient Hospital Stay: Payer: 59 | Attending: Family

## 2020-04-04 ENCOUNTER — Other Ambulatory Visit: Payer: Self-pay

## 2020-04-04 VITALS — BP 119/73 | HR 77 | Temp 97.7°F | Resp 17

## 2020-04-04 DIAGNOSIS — D500 Iron deficiency anemia secondary to blood loss (chronic): Secondary | ICD-10-CM | POA: Insufficient documentation

## 2020-04-04 DIAGNOSIS — K922 Gastrointestinal hemorrhage, unspecified: Secondary | ICD-10-CM | POA: Insufficient documentation

## 2020-04-04 MED ORDER — ACETAMINOPHEN 325 MG PO TABS
ORAL_TABLET | ORAL | Status: AC
  Filled 2020-04-04: qty 2

## 2020-04-04 MED ORDER — DIPHENHYDRAMINE HCL 25 MG PO CAPS
50.0000 mg | ORAL_CAPSULE | Freq: Once | ORAL | Status: AC
  Administered 2020-04-04: 50 mg via ORAL

## 2020-04-04 MED ORDER — ACETAMINOPHEN 325 MG PO TABS
650.0000 mg | ORAL_TABLET | Freq: Once | ORAL | Status: AC
  Administered 2020-04-04: 650 mg via ORAL

## 2020-04-04 MED ORDER — SODIUM CHLORIDE 0.9 % IV SOLN
1950.0000 mg | Freq: Once | INTRAVENOUS | Status: AC
  Administered 2020-04-04: 1950 mg via INTRAVENOUS
  Filled 2020-04-04: qty 39

## 2020-04-04 MED ORDER — SODIUM CHLORIDE 0.9 % IV SOLN
50.0000 mg | Freq: Once | INTRAVENOUS | Status: AC
  Administered 2020-04-04: 50 mg via INTRAVENOUS
  Filled 2020-04-04: qty 1

## 2020-04-04 MED ORDER — DIPHENHYDRAMINE HCL 25 MG PO CAPS
ORAL_CAPSULE | ORAL | Status: AC
  Filled 2020-04-04: qty 2

## 2020-04-04 MED ORDER — SODIUM CHLORIDE 0.9 % IV SOLN
Freq: Once | INTRAVENOUS | Status: AC
  Filled 2020-04-04: qty 250

## 2020-04-04 NOTE — Progress Notes (Signed)
Pt discharged in no apparent distress. Pt left ambulatory without assistance. Pt aware of discharge instructions and verbalized understanding and had no further questions.  

## 2020-04-04 NOTE — Patient Instructions (Signed)
Iron Dextran injection What is this medicine? IRON DEXTRAN (AHY ern DEX tran) is an iron complex. Iron is used to make healthy red blood cells, which carry oxygen and nutrients through the body. This medicine is used to treat people who cannot take iron by mouth and have low levels of iron in the blood. This medicine may be used for other purposes; ask your health care provider or pharmacist if you have questions. COMMON BRAND NAME(S): Dexferrum, INFeD What should I tell my health care provider before I take this medicine? They need to know if you have any of these conditions:  anemia not caused by low iron levels  heart disease  high levels of iron in the blood  kidney disease  liver disease  an unusual or allergic reaction to iron, other medicines, foods, dyes, or preservatives  pregnant or trying to get pregnant  breast-feeding How should I use this medicine? This medicine is for injection into a vein or a muscle. It is given by a health care professional in a hospital or clinic setting. Talk to your pediatrician regarding the use of this medicine in children. While this drug may be prescribed for children as young as 4 months old for selected conditions, precautions do apply. Overdosage: If you think you have taken too much of this medicine contact a poison control center or emergency room at once. NOTE: This medicine is only for you. Do not share this medicine with others. What if I miss a dose? It is important not to miss your dose. Call your doctor or health care professional if you are unable to keep an appointment. What may interact with this medicine? Do not take this medicine with any of the following medications:  deferoxamine  dimercaprol  other iron products This medicine may also interact with the following medications:  chloramphenicol  deferasirox This list may not describe all possible interactions. Give your health care provider a list of all the  medicines, herbs, non-prescription drugs, or dietary supplements you use. Also tell them if you smoke, drink alcohol, or use illegal drugs. Some items may interact with your medicine. What should I watch for while using this medicine? Visit your doctor or health care professional regularly. Tell your doctor if your symptoms do not start to get better or if they get worse. You may need blood work done while you are taking this medicine. You may need to follow a special diet. Talk to your doctor. Foods that contain iron include: whole grains/cereals, dried fruits, beans, or peas, leafy green vegetables, and organ meats (liver, kidney). Long-term use of this medicine may increase your risk of some cancers. Talk to your doctor about how to limit your risk. What side effects may I notice from receiving this medicine? Side effects that you should report to your doctor or health care professional as soon as possible:  allergic reactions like skin rash, itching or hives, swelling of the face, lips, or tongue  blue lips, nails, or skin  breathing problems  changes in blood pressure  chest pain  confusion  fast, irregular heartbeat  feeling faint or lightheaded, falls  fever or chills  flushing, sweating, or hot feelings  joint or muscle aches or pains  pain, tingling, numbness in the hands or feet  seizures  unusually weak or tired Side effects that usually do not require medical attention (report to your doctor or health care professional if they continue or are bothersome):  change in taste (metallic taste)    diarrhea  headache  irritation at site where injected  nausea, vomiting  stomach upset This list may not describe all possible side effects. Call your doctor for medical advice about side effects. You may report side effects to FDA at 1-800-FDA-1088. Where should I keep my medicine? This drug is given in a hospital or clinic and will not be stored at home. NOTE: This  sheet is a summary. It may not cover all possible information. If you have questions about this medicine, talk to your doctor, pharmacist, or health care provider.  2020 Elsevier/Gold Standard (2007-09-05 16:59:50)  

## 2020-05-08 IMAGING — DX DG CERVICAL SPINE 2 OR 3 VIEWS
3 series · 3 of 3 positions shown · non-contrast
Comparison: None.

CLINICAL DATA: Right neck pain x1 week

EXAM:
CERVICAL SPINE - 2-3 VIEW

[c-spine lat]
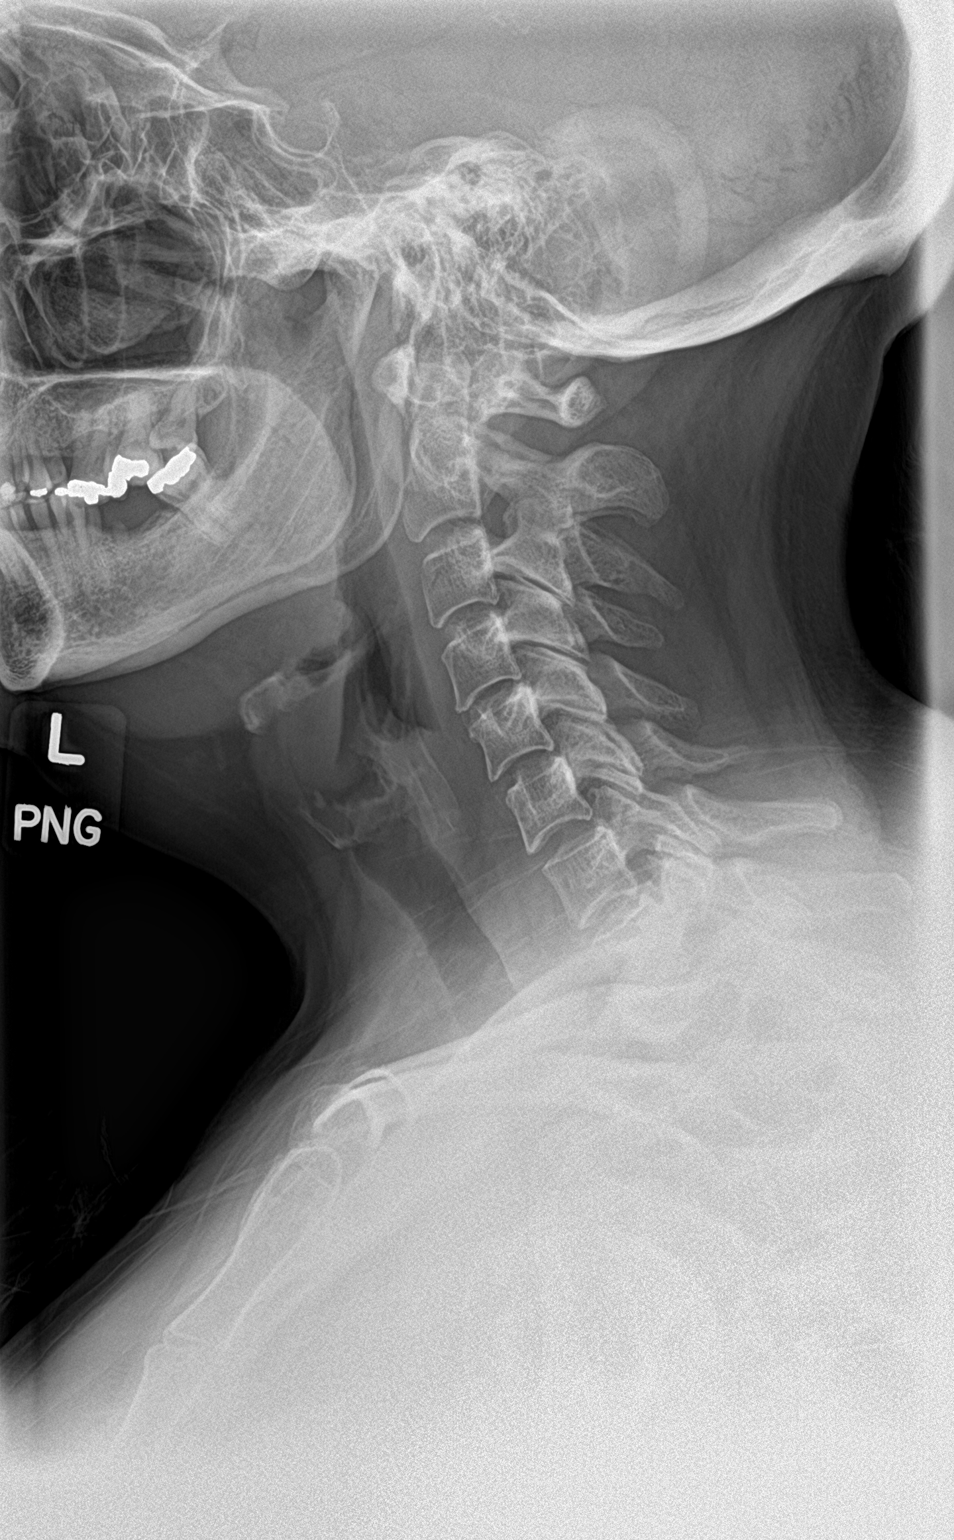

[c-spine ap]
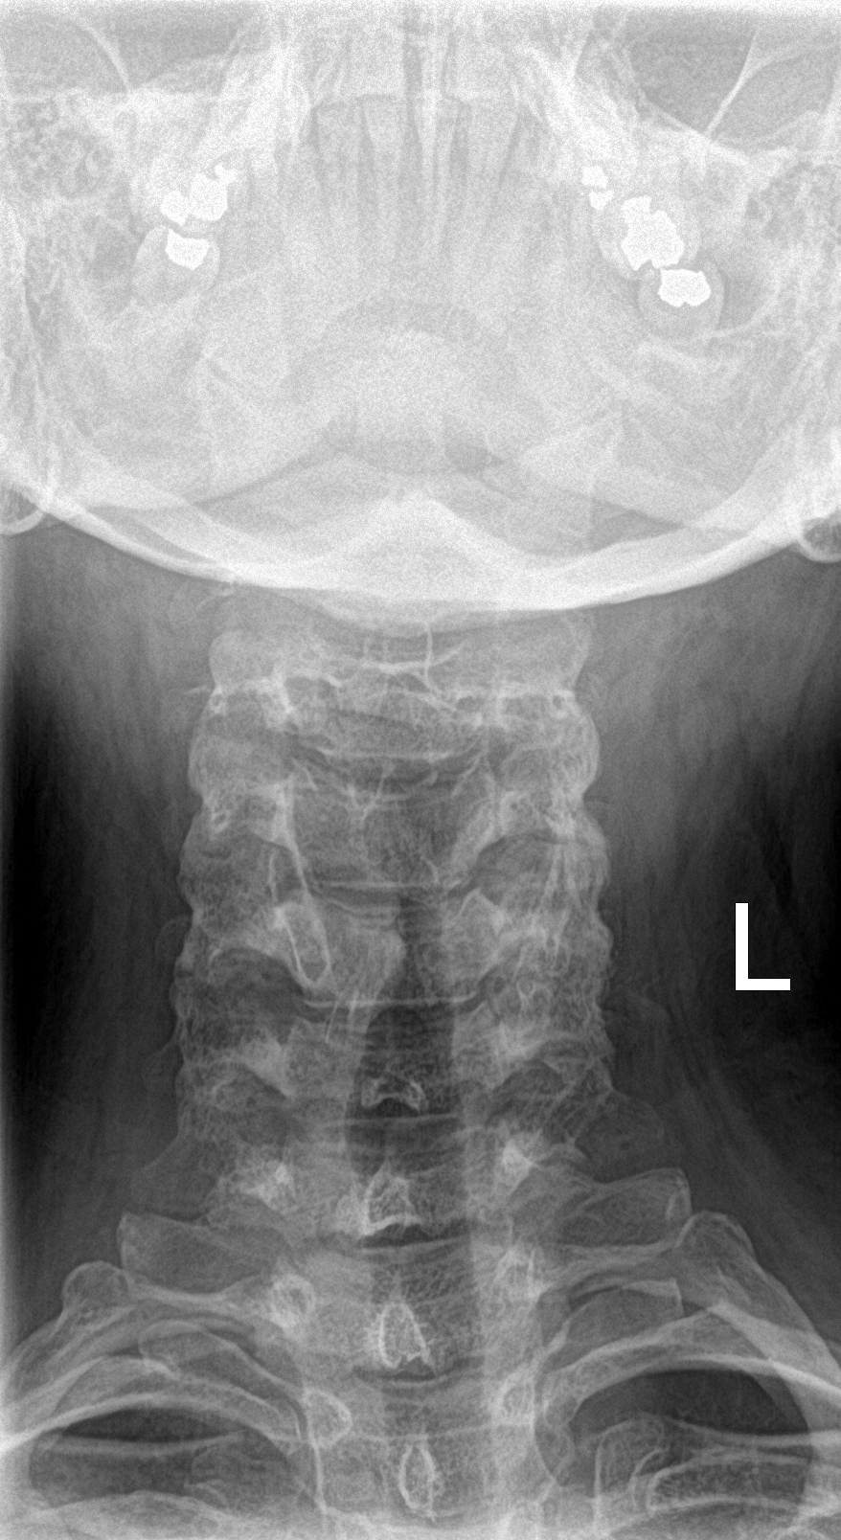

[c-spine open mouth]
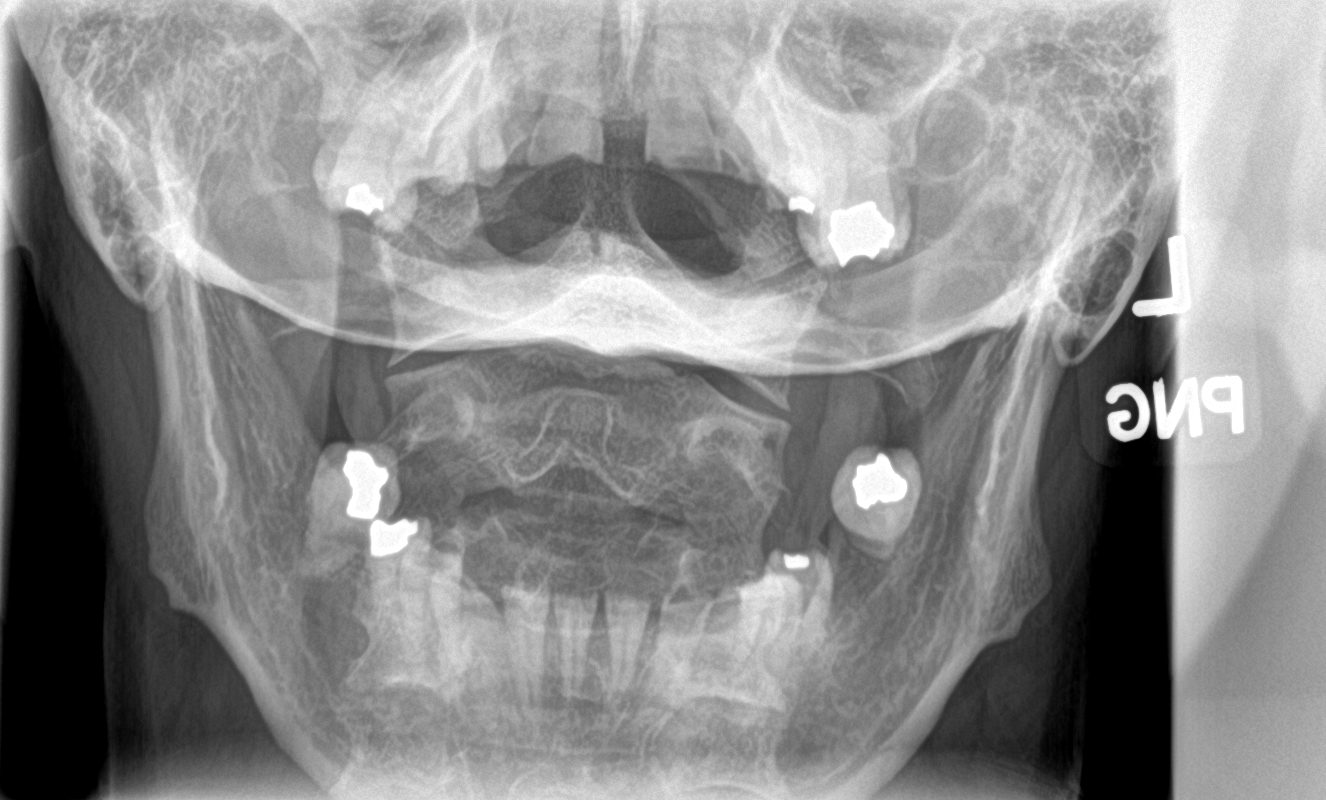

[3 of 3 positions shown; findings below may reference images not displayed]

FINDINGS: Normal cervical lordosis.

No evidence of fracture or dislocation. Vertebral body heights are
maintained. Dens partially obscured but intact. Lateral masses C1
are symmetric.

No prevertebral soft tissue swelling.

Mild degenerative changes at C4-5.

Visualized lung apices are clear.
IMPRESSION: Negative cervical spine radiographs.

## 2020-05-19 ENCOUNTER — Inpatient Hospital Stay: Payer: 59

## 2020-05-19 ENCOUNTER — Telehealth: Payer: Self-pay

## 2020-05-19 ENCOUNTER — Inpatient Hospital Stay: Payer: 59 | Admitting: Hematology & Oncology

## 2020-05-19 NOTE — Telephone Encounter (Signed)
Per sch message pt req to r/s 1/17 appt for end of feb, done, pt to view on my chart    aom

## 2020-06-05 ENCOUNTER — Encounter: Payer: Self-pay | Admitting: Hematology & Oncology

## 2020-06-24 ENCOUNTER — Telehealth: Payer: Self-pay

## 2020-06-24 NOTE — Telephone Encounter (Signed)
Pt called and r/s her 06/26/20 appt to April per her req     Amanda [name]

## 2020-06-25 ENCOUNTER — Other Ambulatory Visit: Payer: Self-pay

## 2020-06-26 ENCOUNTER — Inpatient Hospital Stay: Payer: 59

## 2020-06-26 ENCOUNTER — Inpatient Hospital Stay: Payer: 59 | Admitting: Hematology & Oncology

## 2020-06-30 MED ORDER — TRAMADOL HCL 50 MG PO TABS: 50.0000 mg | ORAL_TABLET | Freq: Four times a day (QID) | ORAL | 1 refills | Status: DC | PRN

## 2020-06-30 MED ORDER — ZOLPIDEM TARTRATE 5 MG PO TABS
5.0000 mg | ORAL_TABLET | Freq: Every evening | ORAL | 1 refills | Status: DC | PRN
Start: 2020-06-30 — End: 2021-04-29

## 2020-08-08 ENCOUNTER — Other Ambulatory Visit: Payer: Self-pay

## 2020-08-08 ENCOUNTER — Inpatient Hospital Stay: Payer: 59 | Attending: Hematology & Oncology

## 2020-08-08 ENCOUNTER — Encounter: Payer: Self-pay | Admitting: Hematology & Oncology

## 2020-08-08 ENCOUNTER — Inpatient Hospital Stay (HOSPITAL_BASED_OUTPATIENT_CLINIC_OR_DEPARTMENT_OTHER): Payer: 59 | Admitting: Hematology & Oncology

## 2020-08-08 VITALS — BP 143/79 | HR 79 | Temp 98.6°F | Resp 20 | Wt 235.0 lb

## 2020-08-08 DIAGNOSIS — D500 Iron deficiency anemia secondary to blood loss (chronic): Secondary | ICD-10-CM

## 2020-08-08 DIAGNOSIS — K922 Gastrointestinal hemorrhage, unspecified: Secondary | ICD-10-CM | POA: Diagnosis not present

## 2020-08-08 DIAGNOSIS — D509 Iron deficiency anemia, unspecified: Secondary | ICD-10-CM | POA: Diagnosis not present

## 2020-08-08 LAB — CMP (CANCER CENTER ONLY)
ALT: 48 U/L — ABNORMAL HIGH (ref 0–44)
AST: 26 U/L (ref 15–41)
Albumin: 4.4 g/dL (ref 3.5–5.0)
Alkaline Phosphatase: 47 U/L (ref 38–126)
Anion gap: 8 (ref 5–15)
BUN: 14 mg/dL (ref 6–20)
CO2: 29 mmol/L (ref 22–32)
Calcium: 10 mg/dL (ref 8.9–10.3)
Chloride: 107 mmol/L (ref 98–111)
Creatinine: 0.86 mg/dL (ref 0.44–1.00)
GFR, Estimated: >60 mL/min (ref >60)
Glucose, Bld: 110 mg/dL — ABNORMAL HIGH (ref 70–99)
Potassium: 4.5 mmol/L (ref 3.5–5.1)
Sodium: 144 mmol/L (ref 135–145)
Total Bilirubin: 0.2 mg/dL — ABNORMAL LOW (ref 0.3–1.2)
Total Protein: 7.3 g/dL (ref 6.5–8.1)

## 2020-08-08 LAB — CBC WITH DIFFERENTIAL (CANCER CENTER ONLY)
Abs Immature Granulocytes: 0.07 10*3/uL (ref 0.00–0.07)
Basophils Absolute: 0.1 10*3/uL (ref 0.0–0.1)
Basophils Relative: 1 %
Eosinophils Absolute: 0.2 10*3/uL (ref 0.0–0.5)
Eosinophils Relative: 4 %
HCT: 30.6 % — ABNORMAL LOW (ref 36.0–46.0)
Hemoglobin: 8.9 g/dL — ABNORMAL LOW (ref 12.0–15.0)
Immature Granulocytes: 1 %
Lymphocytes Relative: 29 %
Lymphs Abs: 1.5 10*3/uL (ref 0.7–4.0)
MCH: 23.6 pg — ABNORMAL LOW (ref 26.0–34.0)
MCHC: 29.1 g/dL — ABNORMAL LOW (ref 30.0–36.0)
MCV: 81.2 fL (ref 80.0–100.0)
Monocytes Absolute: 0.6 10*3/uL (ref 0.1–1.0)
Monocytes Relative: 11 %
Neutro Abs: 2.7 10*3/uL (ref 1.7–7.7)
Neutrophils Relative %: 54 %
Platelet Count: 307 10*3/uL (ref 150–400)
RBC: 3.77 MIL/uL — ABNORMAL LOW (ref 3.87–5.11)
RDW: 15.7 % — ABNORMAL HIGH (ref 11.5–15.5)
WBC Count: 5.1 10*3/uL (ref 4.0–10.5)
nRBC: 0 % (ref 0.0–0.2)

## 2020-08-08 NOTE — Progress Notes (Signed)
Hematology and Oncology Follow Up Visit  Rheda Kassab [id number] 02/10/1966 55 y.o. 08/08/2020   Principle Diagnosis:  Iron deficiency anemia   Current Therapy: IV iron as indicated   Interim History:  Ms. Kuriakose is here today for follow-up.  We last saw her 6 months ago.  Unfortunately, her hemoglobin is down.  She says that on occasion she does have melenic stools.  I am sure that she does have GI blood loss.  Her hemoglobin is 8.9.  Her MCV is down.  She is worried about her family.  She is from [place].  Her husband still has family over in [place].  I just feel bad that they are suffering because of the [place] attacks.  There is been no problems with cough or shortness of breath.  She has had no issues with nausea or vomiting.  She is eating okay.  Overall, I would say performance status is ECOG 1.   Medications:  Allergies as of 08/08/2020   No Known Allergies     Medication List       Accurate as of August 08, 2020  4:03 PM. If you have any questions, ask your nurse or doctor.        b complex vitamins tablet Take 1 tablet by mouth daily.   cetirizine 10 MG tablet Commonly known as: ZYRTEC Take 10 mg by mouth daily.   meloxicam 15 MG tablet Commonly known as: Mobic Take 1 tablet (15 mg total) by mouth daily. Take right after dinner.   traMADol 50 MG tablet Commonly known as: ULTRAM Take 1 tablet (50 mg total) by mouth every 6 (six) hours as needed for severe pain.   Vitamin B-12 1000 MCG Subl Place 1 tablet (1,000 mcg total) under the tongue daily.   Vitamin D3 50 MCG (2000 UT) capsule Take 1 capsule (2,000 Units total) by mouth daily.   zolpidem 5 MG tablet Commonly known as: AMBIEN Take 1 tablet (5 mg total) by mouth at bedtime as needed for sleep.       Allergies: No Known Allergies  Past Medical History, Surgical history, Social history, and Family History were reviewed and updated.  Review of Systems: Review of Systems   Constitutional: Positive for malaise/fatigue.  HENT: Negative.   Eyes: Negative.   Respiratory: Negative.   Cardiovascular: Negative.   Gastrointestinal: Positive for blood in stool.  Genitourinary: Negative.   Musculoskeletal: Positive for joint pain and myalgias.  Skin: Negative.   Neurological: Negative.   Endo/Heme/Allergies: Negative.   Psychiatric/Behavioral: Negative.      Physical Exam:  weight is 235 lb (106.6 kg). Her oral temperature is 98.6 F (37 C). Her blood pressure is 143/79 (abnormal) and her pulse is 79. Her respiration is 20 and oxygen saturation is 100%.   Wt Readings from Last 3 Encounters:  08/08/20 235 lb (106.6 kg)  03/21/20 233 lb (105.7 kg)  12/28/19 226 lb (102.5 kg)    Her vital signs show temperature of 97.8.  Pulse 84.  Blood pressure 132/82.  Weight is 233 pounds.  Physical Exam Vitals reviewed.  HENT:     Head: Normocephalic and atraumatic.  Eyes:     Pupils: Pupils are equal, round, and reactive to light.  Cardiovascular:     Rate and Rhythm: Normal rate and regular rhythm.     Heart sounds: Normal heart sounds.  Pulmonary:     Effort: Pulmonary effort is normal.     Breath sounds: Normal breath sounds.  Abdominal:  General: Bowel sounds are normal.     Palpations: Abdomen is soft.  Musculoskeletal:        General: No tenderness or deformity. Normal range of motion.     Cervical back: Normal range of motion.  Lymphadenopathy:     Cervical: No cervical adenopathy.  Skin:    General: Skin is warm and dry.     Findings: No erythema or rash.  Neurological:     Mental Status: She is alert and oriented to person, place, and time.  Psychiatric:        Behavior: Behavior normal.        Thought Content: Thought content normal.        Judgment: Judgment normal.      Lab Results  Component Value Date   WBC 5.1 08/08/2020   HGB 8.9 (L) 08/08/2020   HCT 30.6 (L) 08/08/2020   MCV 81.2 08/08/2020   PLT 307 08/08/2020   Lab  Results  Component Value Date   FERRITIN 6 (L) 03/21/2020   IRON 27 (L) 03/21/2020   TIBC 460 (H) 03/21/2020   UIBC 433 (H) 03/21/2020   IRONPCTSAT 6 (L) 03/21/2020   Lab Results  Component Value Date   RETICCTPCT 2.1 03/21/2020   RBC 3.77 (L) 08/08/2020   No results found for: KPAFRELGTCHN, LAMBDASER, KAPLAMBRATIO No results found for: IGGSERUM, IGA, IGMSERUM No results found for: Marda Stalker, SPEI   Chemistry      Component Value Date/Time   NA 144 08/08/2020 1451   K 4.5 08/08/2020 1451   CL 107 08/08/2020 1451   CO2 29 08/08/2020 1451   BUN 14 08/08/2020 1451   CREATININE 0.86 08/08/2020 1451      Component Value Date/Time   CALCIUM 10.0 08/08/2020 1451   ALKPHOS 47 08/08/2020 1451   AST 26 08/08/2020 1451   ALT 48 (H) 08/08/2020 1451   BILITOT 0.2 (L) 08/08/2020 1451       Impression and Plan: Ms. Mitnick is a pleasant 55yo [place] female with iron deficiency anemia.  She in all likelihood has intermittent GI bleeding.  Again is hard to say where this could be coming from.  I would have to think that the bleeding would be coming from the small bowel.  I will try her on Injectafer.  She seemed to have very little response to the iron dextran.  We are going to have to recheck her erythropoietin level.  We are going to have to get her back much more often than 6 months.  I am not sure why it took so long for her to get back this time.  [name], MD 4/8/20224:03 PM

## 2020-08-11 ENCOUNTER — Other Ambulatory Visit: Payer: Self-pay | Admitting: Family

## 2020-08-11 ENCOUNTER — Telehealth: Payer: Self-pay | Admitting: *Deleted

## 2020-08-11 ENCOUNTER — Inpatient Hospital Stay: Payer: 59

## 2020-08-11 ENCOUNTER — Other Ambulatory Visit: Payer: Self-pay

## 2020-08-11 VITALS — BP 117/73 | HR 84 | Temp 98.2°F | Resp 17

## 2020-08-11 DIAGNOSIS — D500 Iron deficiency anemia secondary to blood loss (chronic): Secondary | ICD-10-CM

## 2020-08-11 DIAGNOSIS — D509 Iron deficiency anemia, unspecified: Secondary | ICD-10-CM | POA: Diagnosis not present

## 2020-08-11 LAB — IRON AND TIBC
Iron: 26 ug/dL — ABNORMAL LOW (ref 41–142)
Saturation Ratios: 5 % — ABNORMAL LOW (ref 21–57)
TIBC: 473 ug/dL — ABNORMAL HIGH (ref 236–444)
UIBC: 447 ug/dL — ABNORMAL HIGH (ref 120–384)

## 2020-08-11 LAB — FERRITIN: Ferritin: 6 ng/mL — ABNORMAL LOW (ref 11–307)

## 2020-08-11 MED ORDER — SODIUM CHLORIDE 0.9 % IV SOLN
Freq: Once | INTRAVENOUS | Status: AC
  Filled 2020-08-11: qty 250

## 2020-08-11 MED ORDER — SODIUM CHLORIDE 0.9 % IV SOLN
510.0000 mg | Freq: Once | INTRAVENOUS | Status: AC
  Administered 2020-08-11: 510 mg via INTRAVENOUS
  Filled 2020-08-11: qty 510

## 2020-08-11 NOTE — Patient Instructions (Signed)
Ferumoxytol injection What is this medicine? FERUMOXYTOL is an iron complex. Iron is used to make healthy red blood cells, which carry oxygen and nutrients throughout the body. This medicine is used to treat iron deficiency anemia. This medicine may be used for other purposes; ask your health care provider or pharmacist if you have questions. COMMON BRAND NAME(S): Feraheme What should I tell my health care provider before I take this medicine? They need to know if you have any of these conditions:  anemia not caused by low iron levels  high levels of iron in the blood  magnetic resonance imaging (MRI) test scheduled  an unusual or allergic reaction to iron, other medicines, foods, dyes, or preservatives  pregnant or trying to get pregnant  breast-feeding How should I use this medicine? This medicine is for injection into a vein. It is given by a health care professional in a hospital or clinic setting. Talk to your pediatrician regarding the use of this medicine in children. Special care may be needed. Overdosage: If you think you have taken too much of this medicine contact a poison control center or emergency room at once. NOTE: This medicine is only for you. Do not share this medicine with others. What if I miss a dose? It is important not to miss your dose. Call your doctor or health care professional if you are unable to keep an appointment. What may interact with this medicine? This medicine may interact with the following medications:  other iron products This list may not describe all possible interactions. Give your health care provider a list of all the medicines, herbs, non-prescription drugs, or dietary supplements you use. Also tell them if you smoke, drink alcohol, or use illegal drugs. Some items may interact with your medicine. What should I watch for while using this medicine? Visit your doctor or healthcare professional regularly. Tell your doctor or healthcare  professional if your symptoms do not start to get better or if they get worse. You may need blood work done while you are taking this medicine. You may need to follow a special diet. Talk to your doctor. Foods that contain iron include: whole grains/cereals, dried fruits, beans, or peas, leafy green vegetables, and organ meats (liver, kidney). What side effects may I notice from receiving this medicine? Side effects that you should report to your doctor or health care professional as soon as possible:  allergic reactions like skin rash, itching or hives, swelling of the face, lips, or tongue  breathing problems  changes in blood pressure  feeling faint or lightheaded, falls  fever or chills  flushing, sweating, or hot feelings  swelling of the ankles or feet Side effects that usually do not require medical attention (report to your doctor or health care professional if they continue or are bothersome):  diarrhea  headache  nausea, vomiting  stomach pain This list may not describe all possible side effects. Call your doctor for medical advice about side effects. You may report side effects to FDA at 1-800-FDA-1088. Where should I keep my medicine? This drug is given in a hospital or clinic and will not be stored at home. NOTE: This sheet is a summary. It may not cover all possible information. If you have questions about this medicine, talk to your doctor, pharmacist, or health care provider.  2021 Elsevier/Gold Standard (2016-06-07 20:21:10)  

## 2020-08-11 NOTE — Telephone Encounter (Signed)
-----   Message from [name], MD sent at 08/11/2020 11:06 AM EDT ----- Call - the iron is low!!!  She needs IV iron!!  Amanda [name]

## 2020-08-11 NOTE — Telephone Encounter (Signed)
Notified pt of iron results. Iron appt 4/18.

## 2020-08-11 NOTE — Progress Notes (Signed)
Pt declined to stay for post infusion observation period. Pt stated she has tolerated medication multiple times prior without difficulty. Pt aware to call clinic with any questions or concerns. Pt verbalized understanding and had no further questions.  ? ?

## 2020-08-18 ENCOUNTER — Other Ambulatory Visit: Payer: Self-pay

## 2020-08-18 ENCOUNTER — Inpatient Hospital Stay: Payer: 59

## 2020-08-18 VITALS — BP 113/77 | HR 85 | Temp 98.0°F | Resp 16

## 2020-08-18 DIAGNOSIS — D509 Iron deficiency anemia, unspecified: Secondary | ICD-10-CM | POA: Diagnosis not present

## 2020-08-18 DIAGNOSIS — D500 Iron deficiency anemia secondary to blood loss (chronic): Secondary | ICD-10-CM

## 2020-08-18 MED ORDER — SODIUM CHLORIDE 0.9 % IV SOLN
510.0000 mg | Freq: Once | INTRAVENOUS | Status: AC
  Administered 2020-08-18: 510 mg via INTRAVENOUS
  Filled 2020-08-18: qty 17

## 2020-08-18 MED ORDER — SODIUM CHLORIDE 0.9 % IV SOLN
Freq: Once | INTRAVENOUS | Status: AC
  Filled 2020-08-18: qty 250

## 2020-08-18 NOTE — Patient Instructions (Signed)

## 2020-08-18 NOTE — Progress Notes (Signed)
Patient declined to stay for the post infusion observation period. Patient denies any difficulty with this infusion in the past and is aware to call with any questions or concerns.   Pt verbalized understanding and had no further questions today.   

## 2020-09-01 ENCOUNTER — Telehealth: Payer: Self-pay

## 2020-09-01 NOTE — Telephone Encounter (Signed)
appts r/s from 5/4 per pt req    Amanda [name]

## 2020-09-03 ENCOUNTER — Inpatient Hospital Stay: Payer: 59

## 2020-09-03 ENCOUNTER — Inpatient Hospital Stay: Payer: 59 | Admitting: Family

## 2020-09-12 ENCOUNTER — Encounter (HOSPITAL_COMMUNITY): Payer: Self-pay | Admitting: Emergency Medicine

## 2020-09-12 ENCOUNTER — Other Ambulatory Visit: Payer: Self-pay

## 2020-09-12 DIAGNOSIS — R1030 Lower abdominal pain, unspecified: Secondary | ICD-10-CM | POA: Diagnosis present

## 2020-09-12 DIAGNOSIS — K219 Gastro-esophageal reflux disease without esophagitis: Secondary | ICD-10-CM | POA: Insufficient documentation

## 2020-09-12 DIAGNOSIS — M545 Low back pain, unspecified: Secondary | ICD-10-CM | POA: Diagnosis not present

## 2020-09-12 NOTE — ED Triage Notes (Signed)
Pt with c/o L sided flank pain that started this morning but has progressively gotten worse. Denies any accompanying urinary symptoms.

## 2020-09-13 ENCOUNTER — Emergency Department (HOSPITAL_COMMUNITY)
Admission: EM | Admit: 2020-09-13 | Discharge: 2020-09-13 | Disposition: A | Discharge status: Home / Self Care | Payer: 59 | Attending: Emergency Medicine | Admitting: Emergency Medicine

## 2020-09-13 DIAGNOSIS — M545 Low back pain, unspecified: Secondary | ICD-10-CM

## 2020-09-13 LAB — URINALYSIS, ROUTINE W REFLEX MICROSCOPIC
Bacteria, UA: NONE SEEN
Bilirubin Urine: NEGATIVE
Glucose, UA: NEGATIVE mg/dL
Hgb urine dipstick: NEGATIVE
Ketones, ur: 5 mg/dL — AB
Nitrite: NEGATIVE
Protein, ur: NEGATIVE mg/dL
Specific Gravity, Urine: 1.033 — ABNORMAL HIGH (ref 1.005–1.030)
pH: 5 (ref 5.0–8.0)

## 2020-09-13 MED ORDER — DIAZEPAM 5 MG PO TABS
5.0000 mg | ORAL_TABLET | Freq: Once | ORAL | Status: AC
  Administered 2020-09-13: 5 mg via ORAL
  Filled 2020-09-13: qty 1

## 2020-09-13 MED ORDER — ACETAMINOPHEN 500 MG PO TABS
1000.0000 mg | ORAL_TABLET | Freq: Once | ORAL | Status: AC
  Administered 2020-09-13: 1000 mg via ORAL
  Filled 2020-09-13: qty 2

## 2020-09-13 MED ORDER — KETOROLAC TROMETHAMINE 60 MG/2ML IM SOLN
15.0000 mg | Freq: Once | INTRAMUSCULAR | Status: AC
  Administered 2020-09-13: 15 mg via INTRAMUSCULAR
  Filled 2020-09-13: qty 2

## 2020-09-13 MED ORDER — OXYCODONE HCL 5 MG PO TABS
5.0000 mg | ORAL_TABLET | Freq: Once | ORAL | Status: AC
  Administered 2020-09-13: 5 mg via ORAL
  Filled 2020-09-13: qty 1

## 2020-09-13 NOTE — ED Provider Notes (Signed)
[facility] EMERGENCY DEPARTMENT Provider Note   CSN: [id number] Arrival date & time: 09/12/20  2234     History Chief Complaint  Patient presents with  . Flank Pain    Amanda [name] is a 55 y.o. female.  55 yo F with a chief complaints of left flank pain.  Going on for the past few days.  Worse with movement palpation and twisting.  Radiates down to her lower portion of her abdomen.  Denies any urinary symptoms denies hematuria denies history of kidney stones.  Improves when she sits still.  Denies fever.  Denies abdominal pain.  Denies radiation down the leg.  Denies loss of bowel or bladder denies loss of peripheral sensation denies numbness or weakness in the legs.  Denies history of cancer denies recent surgery or instrumentation of the back.  The history is provided by the patient.  Flank Pain This is a new problem. The current episode started more than 2 days ago. The problem occurs constantly. The problem has been gradually worsening. Pertinent negatives include no chest pain, no abdominal pain, no headaches and no shortness of breath. The symptoms are aggravated by bending and twisting. Nothing relieves the symptoms. She has tried nothing for the symptoms. The treatment provided no relief.       Past Medical History:  Diagnosis Date  . Anemia 2019   iron def  . Anxiety   . Depression   . GERD (gastroesophageal reflux disease)     Patient Active Problem List   Diagnosis Date Noted  . Symptomatic anemia 10/14/2017  . B12 deficiency 08/09/2016  . Vitamin D deficiency 08/09/2016  . Back pain 06/28/2016  . Meralgia paresthetica 06/28/2016  . Upper respiratory infection 06/28/2016  . Grief reaction 06/28/2016  . Fatigue 01/20/2009  . PARESTHESIA 01/20/2009  . CHEST PAIN 04/29/2008  . ANEMIA-IRON DEFICIENCY 08/09/2007  . Anxiety state 08/09/2007  . Depression, reactive 07/28/2007  . ESOPHAGITIS, REFLUX 07/28/2007  . GERD 07/28/2007  . GASTRITIS, ACUTE 07/28/2007   . HIATAL HERNIA 07/28/2007  . Fibromyalgia syndrome 07/28/2007  . GASTROINTESTINAL HEMORRHAGE, HX OF 07/28/2007  . HEADACHES, HX OF 07/28/2007    Past Surgical History:  Procedure Laterality Date  . BIOPSY  10/16/2017   Procedure: BIOPSY;  Surgeon: Jeani Hawking, MD;  Location: [facility];  Service: Endoscopy;;  . COLONOSCOPY N/A 10/16/2017   Procedure: COLONOSCOPY;  Surgeon: Jeani Hawking, MD;  Location: [facility];  Service: Endoscopy;  Laterality: N/A;  . ESOPHAGOGASTRODUODENOSCOPY N/A 10/16/2017   Procedure: ESOPHAGOGASTRODUODENOSCOPY (EGD);  Surgeon: Jeani Hawking, MD;  Location: [facility];  Service: Endoscopy;  Laterality: N/A;     OB History   No obstetric history on file.     Family History  Problem Relation Age of Onset  . Diabetes Mother 14  . Hypertension Mother   . Parkinson's disease Father   . Asthma Father   . Cancer Sister   . Heart Problems Maternal Grandmother   . Asthma Maternal Grandfather     Social History   Tobacco Use  . Smoking status: Never Smoker  . Smokeless tobacco: Never Used  Vaping Use  . Vaping Use: Never used  Substance Use Topics  . Alcohol use: No  . Drug use: No    Home Medications Prior to Admission medications   Medication Sig Start Date End Date Taking? Authorizing Provider  b complex vitamins tablet Take 1 tablet by mouth daily. 08/09/16   Plotnikov, Georgina Quint, MD  cetirizine (ZYRTEC) 10 MG tablet Take  10 mg by mouth daily.    [provider]  Cholecalciferol (VITAMIN D3) 2000 units capsule Take 1 capsule (2,000 Units total) by mouth daily. 10/25/17   Plotnikov, Georgina Quint, MD  Cyanocobalamin (VITAMIN B-12) 1000 MCG SUBL Place 1 tablet (1,000 mcg total) under the tongue daily. 10/11/17   Plotnikov, Georgina Quint, MD  meloxicam (MOBIC) 15 MG tablet Take 1 tablet (15 mg total) by mouth daily. Take right after dinner. 04/13/19   [name], MD  traMADol (ULTRAM) 50 MG tablet Take 1 tablet (50 mg total) by mouth  every 6 (six) hours as needed for severe pain. 06/30/20   Plotnikov, Georgina Quint, MD  zolpidem (AMBIEN) 5 MG tablet Take 1 tablet (5 mg total) by mouth at bedtime as needed for sleep. 06/30/20   Plotnikov, Georgina Quint, MD    Allergies    Patient has no known allergies.  Review of Systems   Review of Systems  Constitutional: Negative for chills and fever.  HENT: Negative for congestion and rhinorrhea.   Eyes: Negative for redness and visual disturbance.  Respiratory: Negative for shortness of breath and wheezing.   Cardiovascular: Negative for chest pain and palpitations.  Gastrointestinal: Negative for abdominal pain, nausea and vomiting.  Genitourinary: Positive for flank pain. Negative for dysuria and urgency.  Musculoskeletal: Negative for arthralgias and myalgias.  Skin: Negative for pallor and wound.  Neurological: Negative for dizziness and headaches.    Physical Exam Updated Vital Signs BP (!) 166/98   Pulse (!) 105   Temp 98.4 F (36.9 C) (Oral)   Resp 18   Ht 5\' 2"  (1.575 m)   Wt 100.7 kg   SpO2 100%   BMI 40.60 kg/m   Physical Exam Vitals and nursing note reviewed.  Constitutional:      General: She is not in acute distress.    Appearance: She is well-developed. She is not diaphoretic.  HENT:     Head: Normocephalic and atraumatic.  Eyes:     Pupils: Pupils are equal, round, and reactive to light.  Cardiovascular:     Rate and Rhythm: Normal rate and regular rhythm.     Heart sounds: No murmur heard. No friction rub. No gallop.   Pulmonary:     Effort: Pulmonary effort is normal.     Breath sounds: No wheezing or rales.  Abdominal:     General: There is no distension.     Palpations: Abdomen is soft.     Tenderness: There is no abdominal tenderness.  Musculoskeletal:        General: No tenderness.     Cervical back: Normal range of motion and neck supple.     Comments: No obvious pain step-offs or deformities of the midline spine.  Patient has pain with  sitting and twisting.  Pulse motor and sensation intact in bilateral lower extremities.  Reflexes are 2+ and equal there is no clonus.  Ambulatory.  Skin:    General: Skin is warm and dry.  Neurological:     Mental Status: She is alert and oriented to person, place, and time.  Psychiatric:        Behavior: Behavior normal.     ED Results / Procedures / Treatments   Labs (all labs ordered are listed, but only abnormal results are displayed) Labs Reviewed  URINALYSIS, ROUTINE W REFLEX MICROSCOPIC    EKG None  Radiology No results found.  Procedures Procedures   Medications Ordered in ED Medications  acetaminophen (TYLENOL) tablet  1,000 mg (has no administration in time range)  ketorolac (TORADOL) injection 15 mg (has no administration in time range)  oxyCODONE (Oxy IR/ROXICODONE) immediate release tablet 5 mg (has no administration in time range)  diazepam (VALIUM) tablet 5 mg (has no administration in time range)    ED Course  I have reviewed the triage vital signs and the nursing notes.  Pertinent labs & imaging results that were available during my care of the patient were reviewed by me and considered in my medical decision making (see chart for details).    MDM Rules/Calculators/A&P                          55 yo F with a chief complaints of left flank pain.  Going on for the past few days.  Likely musculoskeletal back pain by history.  Reproduced with twisting and sitting up in bed.  No abdominal pain no red flags.  We will have the patient treat conservatively.  PCP follow-up.  1:37 AM:  I have discussed the diagnosis/risks/treatment options with the patient and believe the pt to be eligible for discharge home to follow-up with PCP. We also discussed returning to the ED immediately if new or worsening sx occur. We discussed the sx which are most concerning (e.g., sudden worsening pain, fever, inability to tolerate by mouth) that necessitate immediate return.  Medications administered to the patient during their visit and any new prescriptions provided to the patient are listed below.  Medications given during this visit Medications  acetaminophen (TYLENOL) tablet 1,000 mg (has no administration in time range)  ketorolac (TORADOL) injection 15 mg (has no administration in time range)  oxyCODONE (Oxy IR/ROXICODONE) immediate release tablet 5 mg (has no administration in time range)  diazepam (VALIUM) tablet 5 mg (has no administration in time range)     The patient appears reasonably screen and/or stabilized for discharge and I doubt any other medical condition or other [facility] requiring further screening, evaluation, or treatment in the ED at this time prior to discharge.   Final Clinical Impression(s) / ED Diagnoses Final diagnoses:  Acute left-sided low back pain without sciatica    Rx / DC Orders ED Discharge Orders    None       Melene Plan, DO 09/13/20 7425

## 2020-09-13 NOTE — Discharge Instructions (Signed)

## 2020-09-19 ENCOUNTER — Inpatient Hospital Stay (HOSPITAL_BASED_OUTPATIENT_CLINIC_OR_DEPARTMENT_OTHER): Payer: 59 | Admitting: Family

## 2020-09-19 ENCOUNTER — Other Ambulatory Visit: Payer: Self-pay

## 2020-09-19 ENCOUNTER — Inpatient Hospital Stay: Payer: 59 | Attending: Hematology & Oncology

## 2020-09-19 ENCOUNTER — Encounter: Payer: Self-pay | Admitting: Family

## 2020-09-19 VITALS — BP 125/87 | HR 85 | Temp 98.4°F | Resp 20 | Ht 62.0 in | Wt 227.0 lb

## 2020-09-19 DIAGNOSIS — Z79899 Other long term (current) drug therapy: Secondary | ICD-10-CM | POA: Diagnosis not present

## 2020-09-19 DIAGNOSIS — D500 Iron deficiency anemia secondary to blood loss (chronic): Secondary | ICD-10-CM

## 2020-09-19 DIAGNOSIS — D509 Iron deficiency anemia, unspecified: Secondary | ICD-10-CM | POA: Insufficient documentation

## 2020-09-19 LAB — CBC WITH DIFFERENTIAL (CANCER CENTER ONLY)
Abs Immature Granulocytes: 0.11 10*3/uL — ABNORMAL HIGH (ref 0.00–0.07)
Basophils Absolute: 0.1 10*3/uL (ref 0.0–0.1)
Basophils Relative: 1 %
Eosinophils Absolute: 0.2 10*3/uL (ref 0.0–0.5)
Eosinophils Relative: 3 %
HCT: 25.4 % — ABNORMAL LOW (ref 36.0–46.0)
Hemoglobin: 7.8 g/dL — ABNORMAL LOW (ref 12.0–15.0)
Immature Granulocytes: 2 %
Lymphocytes Relative: 28 %
Lymphs Abs: 1.4 10*3/uL (ref 0.7–4.0)
MCH: 25.7 pg — ABNORMAL LOW (ref 26.0–34.0)
MCHC: 30.7 g/dL (ref 30.0–36.0)
MCV: 83.6 fL (ref 80.0–100.0)
Monocytes Absolute: 0.5 10*3/uL (ref 0.1–1.0)
Monocytes Relative: 10 %
Neutro Abs: 2.9 10*3/uL (ref 1.7–7.7)
Neutrophils Relative %: 56 %
Platelet Count: 313 10*3/uL (ref 150–400)
RBC: 3.04 MIL/uL — ABNORMAL LOW (ref 3.87–5.11)
RDW: 18.6 % — ABNORMAL HIGH (ref 11.5–15.5)
WBC Count: 5.2 10*3/uL (ref 4.0–10.5)
nRBC: 0.4 % — ABNORMAL HIGH (ref 0.0–0.2)

## 2020-09-19 LAB — CMP (CANCER CENTER ONLY)
ALT: 36 U/L (ref 0–44)
AST: 28 U/L (ref 15–41)
Albumin: 4.5 g/dL (ref 3.5–5.0)
Alkaline Phosphatase: 44 U/L (ref 38–126)
Anion gap: 11 (ref 5–15)
BUN: 15 mg/dL (ref 6–20)
CO2: 26 mmol/L (ref 22–32)
Calcium: 9.6 mg/dL (ref 8.9–10.3)
Chloride: 102 mmol/L (ref 98–111)
Creatinine: 0.92 mg/dL (ref 0.44–1.00)
GFR, Estimated: >60 mL/min (ref >60)
Glucose, Bld: 95 mg/dL (ref 70–99)
Potassium: 3.8 mmol/L (ref 3.5–5.1)
Sodium: 139 mmol/L (ref 135–145)
Total Bilirubin: 0.3 mg/dL (ref 0.3–1.2)
Total Protein: 7.1 g/dL (ref 6.5–8.1)

## 2020-09-19 NOTE — Progress Notes (Signed)
Hematology and Oncology Follow Up Visit  Amanda [name] [id number] 08-01-1965 55 y.o. 09/19/2020   Principle Diagnosis:  Iron deficiency anemia   Current Therapy: IV iron as indicated   Interim History:  Amanda [name] is here today for follow-up. She is doing fairly well but notes occasional SOB, chest pressure and dizziness with over exertion. She denies having any of these symptoms at this time.  She is feeling fatigued.  Hgb 7.8, MCV 83, platelets 313 and WBC count 5.2.  She states that her stool is dark and tarry. She has seen Dr. Marina Goodell and Dr. Elnoria Howard with GI in the past. Her last endoscopy and colonoscopy were in June 2019.  No other blood loss noted. No bruising or petechiae.  No fever, chills, n/v, cough, rash, palpitations, abdominal pain or changes in bowel or bladder habits.  No swelling, tenderness, numbness or tingling in her extremities.  No falls or syncope.  She has maintained a good appetite and is staying well hydrated. Her weight is stable at 227 lbs.   ECOG Performance Status: 1 - Symptomatic but completely ambulatory  Medications:  Allergies as of 09/19/2020   No Known Allergies     Medication List       Accurate as of Sep 19, 2020  3:50 PM. If you have any questions, ask your nurse or doctor.        STOP taking these medications   meloxicam 15 MG tablet Commonly known as: Mobic Stopped by: Emeline Gins, NP     TAKE these medications   b complex vitamins tablet Take 1 tablet by mouth daily.   cetirizine 10 MG tablet Commonly known as: ZYRTEC Take 10 mg by mouth daily.   traMADol 50 MG tablet Commonly known as: ULTRAM Take 1 tablet (50 mg total) by mouth every 6 (six) hours as needed for severe pain.   Vitamin B-12 1000 MCG Subl Place 1 tablet (1,000 mcg total) under the tongue daily.   Vitamin D3 50 MCG (2000 UT) capsule Take 1 capsule (2,000 Units total) by mouth daily.   zolpidem 5 MG tablet Commonly known as: AMBIEN Take 1  tablet (5 mg total) by mouth at bedtime as needed for sleep.       Allergies: No Known Allergies  Past Medical History, Surgical history, Social history, and Family History were reviewed and updated.  Review of Systems: All other 10 point review of systems is negative.   Physical Exam:  vitals were not taken for this visit.   Wt Readings from Last 3 Encounters:  09/12/20 222 lb (100.7 kg)  08/08/20 235 lb (106.6 kg)  03/21/20 233 lb (105.7 kg)    Ocular: Sclerae unicteric, pupils equal, round and reactive to light Ear-nose-throat: Oropharynx clear, dentition fair Lymphatic: No cervical or supraclavicular adenopathy Lungs no rales or rhonchi, good excursion bilaterally Heart regular rate and rhythm, no murmur appreciated Abd soft, nontender, positive bowel sounds MSK no focal spinal tenderness, no joint edema Neuro: non-focal, well-oriented, appropriate affect Breasts: Deferred   Lab Results  Component Value Date   WBC 5.2 09/19/2020   HGB 7.8 (L) 09/19/2020   HCT 25.4 (L) 09/19/2020   MCV 83.6 09/19/2020   PLT 313 09/19/2020   Lab Results  Component Value Date   FERRITIN 6 (L) 08/08/2020   IRON 26 (L) 08/08/2020   TIBC 473 (H) 08/08/2020   UIBC 447 (H) 08/08/2020   IRONPCTSAT 5 (L) 08/08/2020   Lab Results  Component Value Date   RETICCTPCT  2.1 03/21/2020   RBC 3.04 (L) 09/19/2020   No results found for: KPAFRELGTCHN, LAMBDASER, KAPLAMBRATIO No results found for: IGGSERUM, IGA, IGMSERUM No results found for: Marda Stalker, SPEI   Chemistry      Component Value Date/Time   NA 139 09/19/2020 1517   K 3.8 09/19/2020 1517   CL 102 09/19/2020 1517   CO2 26 09/19/2020 1517   BUN 15 09/19/2020 1517   CREATININE 0.92 09/19/2020 1517      Component Value Date/Time   CALCIUM 9.6 09/19/2020 1517   ALKPHOS 44 09/19/2020 1517   AST 28 09/19/2020 1517   ALT 36 09/19/2020 1517   BILITOT 0.3 09/19/2020 1517        Impression and Plan: Amanda [name] is a pleasant 55 yo [place] female with iron deficiency anemia felt to be due to intermittent GI blood loss.  Iron studies are pending. We will get her set up for infusion starting next week.  Patient encouraged to follow-up with GI.  Follow-up in 1 months.  She was encouraged to contact our office with any questions or concerns and to go to the ED in the event of an emergency.   Emeline Gins, NP 5/20/20223:50 PM

## 2020-09-22 ENCOUNTER — Inpatient Hospital Stay: Payer: 59

## 2020-09-22 ENCOUNTER — Other Ambulatory Visit: Payer: Self-pay

## 2020-09-22 ENCOUNTER — Other Ambulatory Visit: Payer: Self-pay | Admitting: Family

## 2020-09-22 VITALS — BP 115/69 | HR 84 | Temp 98.1°F

## 2020-09-22 DIAGNOSIS — D509 Iron deficiency anemia, unspecified: Secondary | ICD-10-CM | POA: Diagnosis not present

## 2020-09-22 DIAGNOSIS — D500 Iron deficiency anemia secondary to blood loss (chronic): Secondary | ICD-10-CM

## 2020-09-22 LAB — IRON AND TIBC
Iron: 19 ug/dL — ABNORMAL LOW (ref 41–142)
Saturation Ratios: 4 % — ABNORMAL LOW (ref 21–57)
TIBC: 461 ug/dL — ABNORMAL HIGH (ref 236–444)
UIBC: 442 ug/dL — ABNORMAL HIGH (ref 120–384)

## 2020-09-22 LAB — FERRITIN: Ferritin: 26 ng/mL (ref 11–307)

## 2020-09-22 LAB — ERYTHROPOIETIN: Erythropoietin: 182.5 m[IU]/mL — ABNORMAL HIGH (ref 2.6–18.5)

## 2020-09-22 MED ORDER — SODIUM CHLORIDE 0.9 % IV SOLN
Freq: Once | INTRAVENOUS | Status: AC
  Filled 2020-09-22: qty 250

## 2020-09-22 MED ORDER — SODIUM CHLORIDE 0.9 % IV SOLN
510.0000 mg | Freq: Once | INTRAVENOUS | Status: AC
  Administered 2020-09-22: 510 mg via INTRAVENOUS
  Filled 2020-09-22: qty 510

## 2020-09-22 NOTE — Progress Notes (Signed)
Pt. Refused to wait 30 min. Post infusion. Released stable and ASX. 

## 2020-09-22 NOTE — Patient Instructions (Signed)
Ferumoxytol injection What is this medicine? FERUMOXYTOL is an iron complex. Iron is used to make healthy red blood cells, which carry oxygen and nutrients throughout the body. This medicine is used to treat iron deficiency anemia. This medicine may be used for other purposes; ask your health care provider or pharmacist if you have questions. COMMON BRAND NAME(S): Feraheme What should I tell my health care provider before I take this medicine? They need to know if you have any of these conditions:  anemia not caused by low iron levels  high levels of iron in the blood  magnetic resonance imaging (MRI) test scheduled  an unusual or allergic reaction to iron, other medicines, foods, dyes, or preservatives  pregnant or trying to get pregnant  breast-feeding How should I use this medicine? This medicine is for injection into a vein. It is given by a health care professional in a hospital or clinic setting. Talk to your pediatrician regarding the use of this medicine in children. Special care may be needed. Overdosage: If you think you have taken too much of this medicine contact a poison control center or emergency room at once. NOTE: This medicine is only for you. Do not share this medicine with others. What if I miss a dose? It is important not to miss your dose. Call your doctor or health care professional if you are unable to keep an appointment. What may interact with this medicine? This medicine may interact with the following medications:  other iron products This list may not describe all possible interactions. Give your health care provider a list of all the medicines, herbs, non-prescription drugs, or dietary supplements you use. Also tell them if you smoke, drink alcohol, or use illegal drugs. Some items may interact with your medicine. What should I watch for while using this medicine? Visit your doctor or healthcare professional regularly. Tell your doctor or healthcare  professional if your symptoms do not start to get better or if they get worse. You may need blood work done while you are taking this medicine. You may need to follow a special diet. Talk to your doctor. Foods that contain iron include: whole grains/cereals, dried fruits, beans, or peas, leafy green vegetables, and organ meats (liver, kidney). What side effects may I notice from receiving this medicine? Side effects that you should report to your doctor or health care professional as soon as possible:  allergic reactions like skin rash, itching or hives, swelling of the face, lips, or tongue  breathing problems  changes in blood pressure  feeling faint or lightheaded, falls  fever or chills  flushing, sweating, or hot feelings  swelling of the ankles or feet Side effects that usually do not require medical attention (report to your doctor or health care professional if they continue or are bothersome):  diarrhea  headache  nausea, vomiting  stomach pain This list may not describe all possible side effects. Call your doctor for medical advice about side effects. You may report side effects to FDA at 1-800-FDA-1088. Where should I keep my medicine? This drug is given in a hospital or clinic and will not be stored at home. NOTE: This sheet is a summary. It may not cover all possible information. If you have questions about this medicine, talk to your doctor, pharmacist, or health care provider.  2021 Elsevier/Gold Standard (2016-06-07 20:21:10)  

## 2020-10-17 ENCOUNTER — Inpatient Hospital Stay: Payer: 59

## 2020-10-17 ENCOUNTER — Other Ambulatory Visit: Payer: Self-pay

## 2020-10-17 ENCOUNTER — Inpatient Hospital Stay (HOSPITAL_BASED_OUTPATIENT_CLINIC_OR_DEPARTMENT_OTHER): Payer: 59 | Admitting: Family

## 2020-10-17 ENCOUNTER — Ambulatory Visit: Payer: 59 | Admitting: Family

## 2020-10-17 ENCOUNTER — Inpatient Hospital Stay: Payer: 59 | Attending: Hematology & Oncology

## 2020-10-17 ENCOUNTER — Encounter: Payer: Self-pay | Admitting: Family

## 2020-10-17 ENCOUNTER — Other Ambulatory Visit: Payer: 59

## 2020-10-17 VITALS — BP 118/75 | HR 86 | Resp 17

## 2020-10-17 VITALS — BP 126/72 | HR 96 | Temp 98.3°F | Resp 17 | Wt 224.0 lb

## 2020-10-17 DIAGNOSIS — D500 Iron deficiency anemia secondary to blood loss (chronic): Secondary | ICD-10-CM

## 2020-10-17 DIAGNOSIS — K921 Melena: Secondary | ICD-10-CM

## 2020-10-17 DIAGNOSIS — D509 Iron deficiency anemia, unspecified: Secondary | ICD-10-CM | POA: Insufficient documentation

## 2020-10-17 LAB — CBC WITH DIFFERENTIAL (CANCER CENTER ONLY)
Abs Immature Granulocytes: 0.21 10*3/uL — ABNORMAL HIGH (ref 0.00–0.07)
Basophils Absolute: 0.1 10*3/uL (ref 0.0–0.1)
Basophils Relative: 2 %
Eosinophils Absolute: 0.2 10*3/uL (ref 0.0–0.5)
Eosinophils Relative: 3 %
HCT: 28.4 % — ABNORMAL LOW (ref 36.0–46.0)
Hemoglobin: 8.4 g/dL — ABNORMAL LOW (ref 12.0–15.0)
Immature Granulocytes: 4 %
Lymphocytes Relative: 27 %
Lymphs Abs: 1.5 10*3/uL (ref 0.7–4.0)
MCH: 24.1 pg — ABNORMAL LOW (ref 26.0–34.0)
MCHC: 29.6 g/dL — ABNORMAL LOW (ref 30.0–36.0)
MCV: 81.4 fL (ref 80.0–100.0)
Monocytes Absolute: 0.6 10*3/uL (ref 0.1–1.0)
Monocytes Relative: 11 %
Neutro Abs: 3 10*3/uL (ref 1.7–7.7)
Neutrophils Relative %: 53 %
Platelet Count: 420 10*3/uL — ABNORMAL HIGH (ref 150–400)
RBC: 3.49 MIL/uL — ABNORMAL LOW (ref 3.87–5.11)
RDW: 19.1 % — ABNORMAL HIGH (ref 11.5–15.5)
WBC Count: 5.7 10*3/uL (ref 4.0–10.5)
nRBC: 0 % (ref 0.0–0.2)

## 2020-10-17 LAB — CMP (CANCER CENTER ONLY)
ALT: 22 U/L (ref 0–44)
AST: 18 U/L (ref 15–41)
Albumin: 4.4 g/dL (ref 3.5–5.0)
Alkaline Phosphatase: 50 U/L (ref 38–126)
Anion gap: 10 (ref 5–15)
BUN: 12 mg/dL (ref 6–20)
CO2: 27 mmol/L (ref 22–32)
Calcium: 9.9 mg/dL (ref 8.9–10.3)
Chloride: 103 mmol/L (ref 98–111)
Creatinine: 0.88 mg/dL (ref 0.44–1.00)
GFR, Estimated: >60 mL/min (ref >60)
Glucose, Bld: 102 mg/dL — ABNORMAL HIGH (ref 70–99)
Potassium: 3.9 mmol/L (ref 3.5–5.1)
Sodium: 140 mmol/L (ref 135–145)
Total Bilirubin: 0.3 mg/dL (ref 0.3–1.2)
Total Protein: 7.4 g/dL (ref 6.5–8.1)

## 2020-10-17 LAB — RETICULOCYTES
Immature Retic Fract: 31.6 % — ABNORMAL HIGH (ref 2.3–15.9)
RBC.: 3.4 MIL/uL — ABNORMAL LOW (ref 3.87–5.11)
Retic Count, Absolute: 148 10*3/uL (ref 19.0–186.0)
Retic Ct Pct: 4.4 % — ABNORMAL HIGH (ref 0.4–3.1)

## 2020-10-17 LAB — FERRITIN: Ferritin: 15 ng/mL (ref 11–307)

## 2020-10-17 LAB — IRON AND TIBC
Iron: 16 ug/dL — ABNORMAL LOW (ref 28–170)
Saturation Ratios: 3 % — ABNORMAL LOW (ref 10.4–31.8)
TIBC: 506 ug/dL — ABNORMAL HIGH (ref 250–450)
UIBC: 490 ug/dL

## 2020-10-17 MED ORDER — SODIUM CHLORIDE 0.9 % IV SOLN
510.0000 mg | Freq: Once | INTRAVENOUS | Status: AC
  Administered 2020-10-17: 510 mg via INTRAVENOUS
  Filled 2020-10-17: qty 17

## 2020-10-17 MED ORDER — SODIUM CHLORIDE 0.9 % IV SOLN
Freq: Once | INTRAVENOUS | Status: AC
  Administered 2020-10-17: 20 mL via INTRAVENOUS
  Filled 2020-10-17: qty 250

## 2020-10-17 NOTE — Patient Instructions (Signed)

## 2020-10-17 NOTE — Progress Notes (Signed)
Pt declined to stay for post infusion observation period. Pt stated she has tolerated medication multiple times prior without difficulty. Pt aware to call clinic with any questions or concerns. Pt verbalized understanding and had no further questions.  ? ?

## 2020-10-17 NOTE — Progress Notes (Addendum)
Hematology and Oncology Follow Up Visit  Adonna Horsley [id number] 1965-06-01 55 y.o. 10/17/2020   Principle Diagnosis:  Chronic iron deficiency anemia     Current Therapy:        IV iron as indicated   Interim History:  Ms. Printz is here today with her daughter and sweet granddaughter for follow-up and her second dose of IV iron.  She is still feeling fatigued and SOB with any exertion. She has also noted occasional palpitations.  She states that her stool has been dark. No other blood loss noted. No bruising or petechiae.  No fever, chills, n/v, cough, rash, dizziness, chest pain, abdominal pain or changes in bowel or bladder habits.  No tenderness, numbness or tingling in her extremities at this time. She occasionally has tingling in her fingertips.  She has puffiness in her feet and ankles that comes and goes. No redness or edema noted on exam. Pedal pulses are 2+.  No falls or syncope to report.  She has maintained a good appetite and is staying well hydrated. Her weight is stable.at 224 lbs.    ECOG Performance Status: 1 - Symptomatic but completely ambulatory  Medications:  Allergies as of 10/17/2020   No Known Allergies      Medication List        Accurate as of October 17, 2020 11:25 AM. If you have any questions, ask your nurse or doctor.          b complex vitamins tablet Take 1 tablet by mouth daily.   cetirizine 10 MG tablet Commonly known as: ZYRTEC Take 10 mg by mouth daily.   traMADol 50 MG tablet Commonly known as: ULTRAM Take 1 tablet (50 mg total) by mouth every 6 (six) hours as needed for severe pain.   Vitamin B-12 1000 MCG Subl Place 1 tablet (1,000 mcg total) under the tongue daily.   Vitamin D3 50 MCG (2000 UT) capsule Take 1 capsule (2,000 Units total) by mouth daily.   zolpidem 5 MG tablet Commonly known as: AMBIEN Take 1 tablet (5 mg total) by mouth at bedtime as needed for sleep.        Allergies: No Known Allergies  Past  Medical History, Surgical history, Social history, and Family History were reviewed and updated.  Review of Systems: All other 10 point review of systems is negative.   Physical Exam:  vitals were not taken for this visit.   Wt Readings from Last 3 Encounters:  09/19/20 227 lb (103 kg)  09/12/20 222 lb (100.7 kg)  08/08/20 235 lb (106.6 kg)    Ocular: Sclerae unicteric, pupils equal, round and reactive to light Ear-nose-throat: Oropharynx clear, dentition fair Lymphatic: No cervical or supraclavicular adenopathy Lungs no rales or rhonchi, good excursion bilaterally Heart regular rate and rhythm, no murmur appreciated Abd soft, nontender, positive bowel sounds MSK no focal spinal tenderness, no joint edema Neuro: non-focal, well-oriented, appropriate affect Breasts: Deferred   Lab Results  Component Value Date   WBC 5.7 10/17/2020   HGB 8.4 (L) 10/17/2020   HCT 28.4 (L) 10/17/2020   MCV 81.4 10/17/2020   PLT 420 (H) 10/17/2020   Lab Results  Component Value Date   FERRITIN 26 09/19/2020   IRON 19 (L) 09/19/2020   TIBC 461 (H) 09/19/2020   UIBC 442 (H) 09/19/2020   IRONPCTSAT 4 (L) 09/19/2020   Lab Results  Component Value Date   RETICCTPCT 4.4 (H) 10/17/2020   RBC 3.40 (L) 10/17/2020   RBC 3.49 (  L) 10/17/2020   No results found for: KPAFRELGTCHN, LAMBDASER, KAPLAMBRATIO No results found for: IGGSERUM, IGA, IGMSERUM No results found for: Marda Stalker, SPEI   Chemistry      Component Value Date/Time   NA 139 09/19/2020 1517   K 3.8 09/19/2020 1517   CL 102 09/19/2020 1517   CO2 26 09/19/2020 1517   BUN 15 09/19/2020 1517   CREATININE 0.92 09/19/2020 1517      Component Value Date/Time   CALCIUM 9.6 09/19/2020 1517   ALKPHOS 44 09/19/2020 1517   AST 28 09/19/2020 1517   ALT 36 09/19/2020 1517   BILITOT 0.3 09/19/2020 1517       Impression and Plan: Ms. Sayres is a pleasant 55 yo [place] female  with iron deficiency anemia felt to be due to intermittent GI blood loss. She received her second dose of IV iron today.  Iron studies are pending. We will give her a third dose if needed. We gave her stool cards to assess for occult blood. If positive we will get her back in to see GI.  Follow-up in 1 month.  She can contact our office with any questions or concerns.   Emeline Gins, NP 6/17/202211:25 AM

## 2020-10-20 ENCOUNTER — Telehealth: Payer: Self-pay | Admitting: *Deleted

## 2020-10-20 NOTE — Addendum Note (Signed)
Addended by: Verdie Mosher on: 10/20/2020 09:04 AM   Modules accepted: Orders

## 2020-10-20 NOTE — Telephone Encounter (Signed)
Per 10/17/20 los - called and lvm of change in appointment per Maralyn Sago ( 1 week later ) asked for callback to confirm message

## 2020-11-11 ENCOUNTER — Other Ambulatory Visit: Payer: 59

## 2020-11-11 ENCOUNTER — Ambulatory Visit: Payer: 59 | Admitting: Family

## 2020-11-18 ENCOUNTER — Other Ambulatory Visit: Payer: Self-pay

## 2020-11-18 ENCOUNTER — Inpatient Hospital Stay (HOSPITAL_BASED_OUTPATIENT_CLINIC_OR_DEPARTMENT_OTHER): Payer: 59 | Admitting: Family

## 2020-11-18 ENCOUNTER — Encounter: Payer: Self-pay | Admitting: Family

## 2020-11-18 ENCOUNTER — Inpatient Hospital Stay: Payer: 59 | Attending: Hematology & Oncology

## 2020-11-18 ENCOUNTER — Telehealth: Payer: Self-pay | Admitting: *Deleted

## 2020-11-18 VITALS — BP 127/94 | HR 99 | Temp 98.5°F | Resp 18 | Ht 62.0 in | Wt 227.1 lb

## 2020-11-18 DIAGNOSIS — D500 Iron deficiency anemia secondary to blood loss (chronic): Secondary | ICD-10-CM

## 2020-11-18 DIAGNOSIS — K922 Gastrointestinal hemorrhage, unspecified: Secondary | ICD-10-CM | POA: Diagnosis not present

## 2020-11-18 DIAGNOSIS — D509 Iron deficiency anemia, unspecified: Secondary | ICD-10-CM | POA: Insufficient documentation

## 2020-11-18 LAB — CBC WITH DIFFERENTIAL (CANCER CENTER ONLY)
Abs Immature Granulocytes: 0.07 10*3/uL (ref 0.00–0.07)
Basophils Absolute: 0.1 10*3/uL (ref 0.0–0.1)
Basophils Relative: 1 %
Eosinophils Absolute: 0.3 10*3/uL (ref 0.0–0.5)
Eosinophils Relative: 5 %
HCT: 36.8 % (ref 36.0–46.0)
Hemoglobin: 11.1 g/dL — ABNORMAL LOW (ref 12.0–15.0)
Immature Granulocytes: 1 %
Lymphocytes Relative: 26 %
Lymphs Abs: 1.5 10*3/uL (ref 0.7–4.0)
MCH: 24.1 pg — ABNORMAL LOW (ref 26.0–34.0)
MCHC: 30.2 g/dL (ref 30.0–36.0)
MCV: 80 fL (ref 80.0–100.0)
Monocytes Absolute: 0.5 10*3/uL (ref 0.1–1.0)
Monocytes Relative: 9 %
Neutro Abs: 3.3 10*3/uL (ref 1.7–7.7)
Neutrophils Relative %: 58 %
Platelet Count: 367 10*3/uL (ref 150–400)
RBC: 4.6 MIL/uL (ref 3.87–5.11)
RDW: 19 % — ABNORMAL HIGH (ref 11.5–15.5)
WBC Count: 5.7 10*3/uL (ref 4.0–10.5)
nRBC: 0 % (ref 0.0–0.2)

## 2020-11-18 LAB — CMP (CANCER CENTER ONLY)
ALT: 26 U/L (ref 0–44)
AST: 22 U/L (ref 15–41)
Albumin: 4.6 g/dL (ref 3.5–5.0)
Alkaline Phosphatase: 55 U/L (ref 38–126)
Anion gap: 9 (ref 5–15)
BUN: 16 mg/dL (ref 6–20)
CO2: 28 mmol/L (ref 22–32)
Calcium: 10.1 mg/dL (ref 8.9–10.3)
Chloride: 103 mmol/L (ref 98–111)
Creatinine: 1.02 mg/dL — ABNORMAL HIGH (ref 0.44–1.00)
GFR, Estimated: >60 mL/min (ref >60)
Glucose, Bld: 144 mg/dL — ABNORMAL HIGH (ref 70–99)
Potassium: 3.8 mmol/L (ref 3.5–5.1)
Sodium: 140 mmol/L (ref 135–145)
Total Bilirubin: 0.3 mg/dL (ref 0.3–1.2)
Total Protein: 7.9 g/dL (ref 6.5–8.1)

## 2020-11-18 LAB — RETICULOCYTES
Immature Retic Fract: 19.8 % — ABNORMAL HIGH (ref 2.3–15.9)
RBC.: 4.68 MIL/uL (ref 3.87–5.11)
Retic Count, Absolute: 63.2 10*3/uL (ref 19.0–186.0)
Retic Ct Pct: 1.4 % (ref 0.4–3.1)

## 2020-11-18 NOTE — Telephone Encounter (Signed)
Per 11/18/20 los gave patient upcoming appointment - patient confirmed 

## 2020-11-18 NOTE — Progress Notes (Signed)
Hematology and Oncology Follow Up Visit  Amanda [name] [id number] 12-Dec-1965 55 y.o. 11/18/2020   Principle Diagnosis:  Chronic iron deficiency anemia     Current Therapy:        IV iron as indicated   Interim History:  Amanda [name] is here today for follow-up. She is feeling much better since receiving IV iron in June. She still has some mild fatigue at times but can tell her energy has increased.  She has occasional mild SOB or dizziness but notes that these symptoms are also improved.  No obvious blood loss noted. No dark tarry stool. No bruising or petechiae.  Hgb is 11.1, MCV 80, platelets 367 and WBC count 5.7.  No fever, chills, n/v, cough, rash, dizziness, SOB, chest pain, palpitations, abdominal pain or changes in bowel or bladder habits.  No swelling, tenderness, numbness or tingling in her extremities.  No falls or syncope to report.  She has a good appetite and states that she is staying well hydrated. Her weight is stable at 227 lbs.   ECOG Performance Status: 1 - Symptomatic but completely ambulatory  Medications:  Allergies as of 11/18/2020   No Known Allergies      Medication List        Accurate as of November 18, 2020  2:47 PM. If you have any questions, ask your nurse or doctor.          b complex vitamins tablet Take 1 tablet by mouth daily.   cetirizine 10 MG tablet Commonly known as: ZYRTEC Take 10 mg by mouth daily.   traMADol 50 MG tablet Commonly known as: ULTRAM Take 1 tablet (50 mg total) by mouth every 6 (six) hours as needed for severe pain.   Vitamin B-12 1000 MCG Subl Place 1 tablet (1,000 mcg total) under the tongue daily.   Vitamin D3 50 MCG (2000 UT) capsule Take 1 capsule (2,000 Units total) by mouth daily.   zolpidem 5 MG tablet Commonly known as: AMBIEN Take 1 tablet (5 mg total) by mouth at bedtime as needed for sleep.        Allergies: No Known Allergies  Past Medical History, Surgical history, Social history, and  Family History were reviewed and updated.  Review of Systems: All other 10 point review of systems is negative.   Physical Exam:  height is 5\' 2"  (1.575 m) and weight is 227 lb 1.9 oz (103 kg). Her oral temperature is 98.5 F (36.9 C). Her blood pressure is 127/94 (abnormal) and her pulse is 99. Her respiration is 18 and oxygen saturation is 100%.   Wt Readings from Last 3 Encounters:  11/18/20 227 lb 1.9 oz (103 kg)  10/17/20 224 lb (101.6 kg)  09/19/20 227 lb (103 kg)    Ocular: Sclerae unicteric, pupils equal, round and reactive to light Ear-nose-throat: Oropharynx clear, dentition fair Lymphatic: No cervical or supraclavicular adenopathy Lungs no rales or rhonchi, good excursion bilaterally Heart regular rate and rhythm, no murmur appreciated Abd soft, nontender, positive bowel sounds MSK no focal spinal tenderness, no joint edema Neuro: non-focal, well-oriented, appropriate affect Breasts: Deferred   Lab Results  Component Value Date   WBC 5.7 11/18/2020   HGB 11.1 (L) 11/18/2020   HCT 36.8 11/18/2020   MCV 80.0 11/18/2020   PLT 367 11/18/2020   Lab Results  Component Value Date   FERRITIN 15 10/17/2020   IRON 16 (L) 10/17/2020   TIBC 506 (H) 10/17/2020   UIBC 490 10/17/2020   IRONPCTSAT  3 (L) 10/17/2020   Lab Results  Component Value Date   RETICCTPCT 1.4 11/18/2020   RBC 4.68 11/18/2020   RBC 4.60 11/18/2020   No results found for: KPAFRELGTCHN, LAMBDASER, KAPLAMBRATIO No results found for: IGGSERUM, IGA, IGMSERUM No results found for: Dorene Ar, A1GS, A2GS, Karn Pickler, SPEI   Chemistry      Component Value Date/Time   NA 140 11/18/2020 1412   K 3.8 11/18/2020 1412   CL 103 11/18/2020 1412   CO2 28 11/18/2020 1412   BUN 16 11/18/2020 1412   CREATININE 1.02 (H) 11/18/2020 1412      Component Value Date/Time   CALCIUM 10.1 11/18/2020 1412   ALKPHOS 55 11/18/2020 1412   AST 22 11/18/2020 1412   ALT 26 11/18/2020 1412    BILITOT 0.3 11/18/2020 1412       Impression and Plan: Amanda [name] is a pleasant 55 yo [place] female with iron deficiency anemia felt to be due to intermittent GI blood loss. Iron studies are pending. We will replace if needed.  Follow-up in 8 weeks.  She can contact our office with any questions or concerns.   Emeline Gins, NP 7/19/20222:47 PM

## 2020-11-19 LAB — IRON AND TIBC
Iron: 30 ug/dL — ABNORMAL LOW (ref 41–142)
Saturation Ratios: 7 % — ABNORMAL LOW (ref 21–57)
TIBC: 444 ug/dL (ref 236–444)
UIBC: 414 ug/dL — ABNORMAL HIGH (ref 120–384)

## 2020-11-19 LAB — FERRITIN: Ferritin: 18 ng/mL (ref 11–307)

## 2020-11-20 ENCOUNTER — Telehealth: Payer: Self-pay

## 2020-11-25 ENCOUNTER — Inpatient Hospital Stay: Payer: 59

## 2020-11-25 ENCOUNTER — Other Ambulatory Visit: Payer: Self-pay

## 2020-11-25 VITALS — BP 128/76 | HR 72 | Temp 97.0°F | Resp 17

## 2020-11-25 DIAGNOSIS — D509 Iron deficiency anemia, unspecified: Secondary | ICD-10-CM | POA: Diagnosis not present

## 2020-11-25 DIAGNOSIS — D500 Iron deficiency anemia secondary to blood loss (chronic): Secondary | ICD-10-CM

## 2020-11-25 MED ORDER — SODIUM CHLORIDE 0.9 % IV SOLN
Freq: Once | INTRAVENOUS | Status: AC
  Filled 2020-11-25: qty 250

## 2020-11-25 MED ORDER — SODIUM CHLORIDE 0.9 % IV SOLN
510.0000 mg | Freq: Once | INTRAVENOUS | Status: AC
  Administered 2020-11-25: 510 mg via INTRAVENOUS
  Filled 2020-11-25: qty 510

## 2020-11-25 NOTE — Progress Notes (Signed)
Pt declined to stay for post infusion observation period. Pt stated she has tolerated medication multiple times prior without difficulty. Pt aware to call clinic with any questions or concerns. Pt verbalized understanding and had no further questions.  ? ?

## 2020-11-25 NOTE — Patient Instructions (Signed)
Ferumoxytol injection What is this medication? FERUMOXYTOL is an iron complex. Iron is used to make healthy red blood cells, which carry oxygen and nutrients throughout the body. This medicine is used totreat iron deficiency anemia. This medicine may be used for other purposes; ask your health care provider orpharmacist if you have questions. COMMON BRAND NAME(S): Feraheme What should I tell my care team before I take this medication? They need to know if you have any of these conditions: anemia not caused by low iron levels high levels of iron in the blood magnetic resonance imaging (MRI) test scheduled an unusual or allergic reaction to iron, other medicines, foods, dyes, or preservatives pregnant or trying to get pregnant breast-feeding How should I use this medication? This medicine is for injection into a vein. It is given by a health careprofessional in a hospital or clinic setting. Talk to your pediatrician regarding the use of this medicine in children.Special care may be needed. Overdosage: If you think you have taken too much of this medicine contact apoison control center or emergency room at once. NOTE: This medicine is only for you. Do not share this medicine with others. What if I miss a dose? It is important not to miss your dose. Call your doctor or health careprofessional if you are unable to keep an appointment. What may interact with this medication? This medicine may interact with the following medications: other iron products This list may not describe all possible interactions. Give your health care provider a list of all the medicines, herbs, non-prescription drugs, or dietary supplements you use. Also tell them if you smoke, drink alcohol, or use illegaldrugs. Some items may interact with your medicine. What should I watch for while using this medication? Visit your doctor or healthcare professional regularly. Tell your doctor or healthcare professional if your  symptoms do not start to get better or if theyget worse. You may need blood work done while you are taking this medicine. You may need to follow a special diet. Talk to your doctor. Foods that contain iron include: whole grains/cereals, dried fruits, beans, or peas, leafy greenvegetables, and organ meats (liver, kidney). What side effects may I notice from receiving this medication? Side effects that you should report to your doctor or health care professionalas soon as possible: allergic reactions like skin rash, itching or hives, swelling of the face, lips, or tongue breathing problems changes in blood pressure feeling faint or lightheaded, falls fever or chills flushing, sweating, or hot feelings swelling of the ankles or feet Side effects that usually do not require medical attention (report to yourdoctor or health care professional if they continue or are bothersome): diarrhea headache nausea, vomiting stomach pain This list may not describe all possible side effects. Call your doctor for medical advice about side effects. You may report side effects to FDA at1-800-FDA-1088. Where should I keep my medication? This drug is given in a hospital or clinic and will not be stored at home. NOTE: This sheet is a summary. It may not cover all possible information. If you have questions about this medicine, talk to your doctor, pharmacist, orhealth care provider.  2022 Elsevier/Gold Standard (2016-06-07 20:21:10)  

## 2020-12-02 ENCOUNTER — Other Ambulatory Visit: Payer: Self-pay

## 2020-12-02 ENCOUNTER — Inpatient Hospital Stay: Payer: 59 | Attending: Hematology & Oncology

## 2020-12-02 VITALS — BP 123/77 | HR 76 | Temp 98.0°F | Resp 17

## 2020-12-02 DIAGNOSIS — D500 Iron deficiency anemia secondary to blood loss (chronic): Secondary | ICD-10-CM

## 2020-12-02 DIAGNOSIS — D509 Iron deficiency anemia, unspecified: Secondary | ICD-10-CM | POA: Diagnosis not present

## 2020-12-02 MED ORDER — SODIUM CHLORIDE 0.9 % IV SOLN
Freq: Once | INTRAVENOUS | Status: AC
  Filled 2020-12-02: qty 250

## 2020-12-02 MED ORDER — SODIUM CHLORIDE 0.9 % IV SOLN
510.0000 mg | Freq: Once | INTRAVENOUS | Status: AC
  Administered 2020-12-02: 510 mg via INTRAVENOUS
  Filled 2020-12-02: qty 510

## 2021-01-23 ENCOUNTER — Other Ambulatory Visit: Payer: 59

## 2021-01-23 ENCOUNTER — Ambulatory Visit: Payer: 59 | Admitting: Family

## 2021-02-27 ENCOUNTER — Inpatient Hospital Stay (HOSPITAL_BASED_OUTPATIENT_CLINIC_OR_DEPARTMENT_OTHER): Payer: 59 | Admitting: Family

## 2021-02-27 ENCOUNTER — Encounter: Payer: Self-pay | Admitting: Family

## 2021-02-27 ENCOUNTER — Other Ambulatory Visit: Payer: Self-pay

## 2021-02-27 ENCOUNTER — Telehealth: Payer: Self-pay | Admitting: *Deleted

## 2021-02-27 ENCOUNTER — Inpatient Hospital Stay: Payer: 59 | Attending: Hematology & Oncology

## 2021-02-27 ENCOUNTER — Inpatient Hospital Stay: Payer: 59

## 2021-02-27 VITALS — BP 115/70

## 2021-02-27 VITALS — BP 130/63 | HR 84 | Temp 98.6°F | Resp 16

## 2021-02-27 DIAGNOSIS — K922 Gastrointestinal hemorrhage, unspecified: Secondary | ICD-10-CM | POA: Diagnosis not present

## 2021-02-27 DIAGNOSIS — D500 Iron deficiency anemia secondary to blood loss (chronic): Secondary | ICD-10-CM

## 2021-02-27 DIAGNOSIS — D509 Iron deficiency anemia, unspecified: Secondary | ICD-10-CM | POA: Diagnosis not present

## 2021-02-27 LAB — CMP (CANCER CENTER ONLY)
ALT: 21 U/L (ref 0–44)
AST: 18 U/L (ref 15–41)
Albumin: 4.2 g/dL (ref 3.5–5.0)
Alkaline Phosphatase: 53 U/L (ref 38–126)
Anion gap: 8 (ref 5–15)
BUN: 14 mg/dL (ref 6–20)
CO2: 29 mmol/L (ref 22–32)
Calcium: 9.9 mg/dL (ref 8.9–10.3)
Chloride: 107 mmol/L (ref 98–111)
Creatinine: 0.95 mg/dL (ref 0.44–1.00)
GFR, Estimated: >60 mL/min (ref >60)
Glucose, Bld: 117 mg/dL — ABNORMAL HIGH (ref 70–99)
Potassium: 4.3 mmol/L (ref 3.5–5.1)
Sodium: 144 mmol/L (ref 135–145)
Total Bilirubin: 0.3 mg/dL (ref 0.3–1.2)
Total Protein: 7.3 g/dL (ref 6.5–8.1)

## 2021-02-27 LAB — CBC WITH DIFFERENTIAL (CANCER CENTER ONLY)
Abs Immature Granulocytes: 0.12 10*3/uL — ABNORMAL HIGH (ref 0.00–0.07)
Basophils Absolute: 0.1 10*3/uL (ref 0.0–0.1)
Basophils Relative: 1 %
Eosinophils Absolute: 0.2 10*3/uL (ref 0.0–0.5)
Eosinophils Relative: 5 %
HCT: 29.1 % — ABNORMAL LOW (ref 36.0–46.0)
Hemoglobin: 8.8 g/dL — ABNORMAL LOW (ref 12.0–15.0)
Immature Granulocytes: 2 %
Lymphocytes Relative: 29 %
Lymphs Abs: 1.5 10*3/uL (ref 0.7–4.0)
MCH: 24.9 pg — ABNORMAL LOW (ref 26.0–34.0)
MCHC: 30.2 g/dL (ref 30.0–36.0)
MCV: 82.2 fL (ref 80.0–100.0)
Monocytes Absolute: 0.4 10*3/uL (ref 0.1–1.0)
Monocytes Relative: 8 %
Neutro Abs: 2.7 10*3/uL (ref 1.7–7.7)
Neutrophils Relative %: 55 %
Platelet Count: 338 10*3/uL (ref 150–400)
RBC: 3.54 MIL/uL — ABNORMAL LOW (ref 3.87–5.11)
RDW: 15.9 % — ABNORMAL HIGH (ref 11.5–15.5)
WBC Count: 5 10*3/uL (ref 4.0–10.5)
nRBC: 0 % (ref 0.0–0.2)

## 2021-02-27 LAB — RETICULOCYTES
Immature Retic Fract: 22.5 % — ABNORMAL HIGH (ref 2.3–15.9)
RBC.: 3.65 MIL/uL — ABNORMAL LOW (ref 3.87–5.11)
Retic Count, Absolute: 106 10*3/uL (ref 19.0–186.0)
Retic Ct Pct: 2.9 % (ref 0.4–3.1)

## 2021-02-27 LAB — IRON AND TIBC
Iron: 20 ug/dL — ABNORMAL LOW (ref 41–142)
Saturation Ratios: 5 % — ABNORMAL LOW (ref 21–57)
TIBC: 446 ug/dL — ABNORMAL HIGH (ref 236–444)
UIBC: 426 ug/dL — ABNORMAL HIGH (ref 120–384)

## 2021-02-27 LAB — FERRITIN: Ferritin: 6 ng/mL — ABNORMAL LOW (ref 11–307)

## 2021-02-27 MED ORDER — SODIUM CHLORIDE 0.9 % IV SOLN
510.0000 mg | Freq: Once | INTRAVENOUS | Status: AC
  Administered 2021-02-27: 510 mg via INTRAVENOUS
  Filled 2021-02-27: qty 17

## 2021-02-27 MED ORDER — SODIUM CHLORIDE 0.9 % IV SOLN
Freq: Once | INTRAVENOUS | Status: AC
Start: 2021-02-27 — End: 2021-02-27

## 2021-02-27 NOTE — Progress Notes (Signed)
Hematology and Oncology Follow Up Visit  Amanda [name] [id number] 1966-03-06 55 y.o. 02/27/2021   Principle Diagnosis:  Chronic iron deficiency anemia     Current Therapy:        IV iron as indicated   Interim History:  Amanda [name] is here today for follow-up. She is symptomatic with fatigue, mild SOB with exertion, palpitations and restless legs at night.  She denies blood loss. No bruising or petechiae.  Hgb 8.8, MCV 82, WBC count 5.0 and platelets 338.  No fever, chills, n/v, cough, rash, dizziness, chest pain, abdominal pain or changes in bowel or bladder habits.  Minimal swelling in the left ankle that comes and goes.  She has tingling in her fingertips.  No falls or syncope to report.  She feels that she has a good appetite and is staying well hydrated with water throughout the day. Her weight is stable at 227 lbs.   ECOG Performance Status: 1 - Symptomatic but completely ambulatory  Medications:  Allergies as of 02/27/2021   No Known Allergies      Medication List        Accurate as of February 27, 2021  8:30 AM. If you have any questions, ask your nurse or doctor.          b complex vitamins tablet Take 1 tablet by mouth daily.   cetirizine 10 MG tablet Commonly known as: ZYRTEC Take 10 mg by mouth daily.   traMADol 50 MG tablet Commonly known as: ULTRAM Take 1 tablet (50 mg total) by mouth every 6 (six) hours as needed for severe pain.   Vitamin B-12 1000 MCG Subl Place 1 tablet (1,000 mcg total) under the tongue daily.   Vitamin D3 50 MCG (2000 UT) capsule Take 1 capsule (2,000 Units total) by mouth daily.   zolpidem 5 MG tablet Commonly known as: AMBIEN Take 1 tablet (5 mg total) by mouth at bedtime as needed for sleep.        Allergies: No Known Allergies  Past Medical History, Surgical history, Social history, and Family History were reviewed and updated.  Review of Systems: All other 10 point review of systems is negative.   Physical  Exam:  vitals were not taken for this visit.   Wt Readings from Last 3 Encounters:  11/18/20 227 lb 1.9 oz (103 kg)  10/17/20 224 lb (101.6 kg)  09/19/20 227 lb (103 kg)    Ocular: Sclerae unicteric, pupils equal, round and reactive to light Ear-nose-throat: Oropharynx clear, dentition fair Lymphatic: No cervical or supraclavicular adenopathy Lungs no rales or rhonchi, good excursion bilaterally Heart regular rate and rhythm, no murmur appreciated Abd soft, nontender, positive bowel sounds MSK no focal spinal tenderness, no joint edema Neuro: non-focal, well-oriented, appropriate affect Breasts: Deferred   Lab Results  Component Value Date   WBC 5.0 02/27/2021   HGB 8.8 (L) 02/27/2021   HCT 29.1 (L) 02/27/2021   MCV 82.2 02/27/2021   PLT 338 02/27/2021   Lab Results  Component Value Date   FERRITIN 18 11/18/2020   IRON 30 (L) 11/18/2020   TIBC 444 11/18/2020   UIBC 414 (H) 11/18/2020   IRONPCTSAT 7 (L) 11/18/2020   Lab Results  Component Value Date   RETICCTPCT 1.4 11/18/2020   RBC 3.54 (L) 02/27/2021   No results found for: KPAFRELGTCHN, LAMBDASER, KAPLAMBRATIO No results found for: IGGSERUM, IGA, IGMSERUM No results found for: TOTALPROTELP, ALBUMINELP, A1GS, A2GS, BETS, BETA2SER, GAMS, MSPIKE, SPEI   Chemistry  Component Value Date/Time   NA 140 11/18/2020 1412   K 3.8 11/18/2020 1412   CL 103 11/18/2020 1412   CO2 28 11/18/2020 1412   BUN 16 11/18/2020 1412   CREATININE 1.02 (H) 11/18/2020 1412      Component Value Date/Time   CALCIUM 10.1 11/18/2020 1412   ALKPHOS 55 11/18/2020 1412   AST 22 11/18/2020 1412   ALT 26 11/18/2020 1412   BILITOT 0.3 11/18/2020 1412       Impression and Plan: Amanda [name] is a pleasant 55 yo [place] female with chronic iron deficiency anemia felt to be due to intermittent GI blood loss. We will give IV iron today and again next week.  Follow-up in 6 weeks.  She can contact our office with any questions or concerns.    Eileen Stanford, NP 10/28/20228:30 AM

## 2021-02-27 NOTE — Telephone Encounter (Signed)
Per 02/27/21 los - gave patient upcoming appointments - confirmed

## 2021-02-27 NOTE — Patient Instructions (Signed)
Ferumoxytol injection What is this medication? FERUMOXYTOL is an iron complex. Iron is used to make healthy red blood cells, which carry oxygen and nutrients throughout the body. This medicine is used totreat iron deficiency anemia. This medicine may be used for other purposes; ask your health care provider orpharmacist if you have questions. COMMON BRAND NAME(S): Feraheme What should I tell my care team before I take this medication? They need to know if you have any of these conditions: anemia not caused by low iron levels high levels of iron in the blood magnetic resonance imaging (MRI) test scheduled an unusual or allergic reaction to iron, other medicines, foods, dyes, or preservatives pregnant or trying to get pregnant breast-feeding How should I use this medication? This medicine is for injection into a vein. It is given by a health careprofessional in a hospital or clinic setting. Talk to your pediatrician regarding the use of this medicine in children.Special care may be needed. Overdosage: If you think you have taken too much of this medicine contact apoison control center or emergency room at once. NOTE: This medicine is only for you. Do not share this medicine with others. What if I miss a dose? It is important not to miss your dose. Call your doctor or health careprofessional if you are unable to keep an appointment. What may interact with this medication? This medicine may interact with the following medications: other iron products This list may not describe all possible interactions. Give your health care provider a list of all the medicines, herbs, non-prescription drugs, or dietary supplements you use. Also tell them if you smoke, drink alcohol, or use illegaldrugs. Some items may interact with your medicine. What should I watch for while using this medication? Visit your doctor or healthcare professional regularly. Tell your doctor or healthcare professional if your  symptoms do not start to get better or if theyget worse. You may need blood work done while you are taking this medicine. You may need to follow a special diet. Talk to your doctor. Foods that contain iron include: whole grains/cereals, dried fruits, beans, or peas, leafy greenvegetables, and organ meats (liver, kidney). What side effects may I notice from receiving this medication? Side effects that you should report to your doctor or health care professionalas soon as possible: allergic reactions like skin rash, itching or hives, swelling of the face, lips, or tongue breathing problems changes in blood pressure feeling faint or lightheaded, falls fever or chills flushing, sweating, or hot feelings swelling of the ankles or feet Side effects that usually do not require medical attention (report to yourdoctor or health care professional if they continue or are bothersome): diarrhea headache nausea, vomiting stomach pain This list may not describe all possible side effects. Call your doctor for medical advice about side effects. You may report side effects to FDA at1-800-FDA-1088. Where should I keep my medication? This drug is given in a hospital or clinic and will not be stored at home. NOTE: This sheet is a summary. It may not cover all possible information. If you have questions about this medicine, talk to your doctor, pharmacist, orhealth care provider.  2022 Elsevier/Gold Standard (2016-06-07 20:21:10)  

## 2021-03-09 ENCOUNTER — Inpatient Hospital Stay: Payer: 59 | Attending: Hematology & Oncology

## 2021-03-09 ENCOUNTER — Other Ambulatory Visit: Payer: Self-pay

## 2021-03-09 VITALS — BP 128/67 | HR 66 | Temp 98.1°F | Resp 18

## 2021-03-09 DIAGNOSIS — D509 Iron deficiency anemia, unspecified: Secondary | ICD-10-CM | POA: Diagnosis present

## 2021-03-09 DIAGNOSIS — D500 Iron deficiency anemia secondary to blood loss (chronic): Secondary | ICD-10-CM

## 2021-03-09 MED ORDER — SODIUM CHLORIDE 0.9 % IV SOLN
510.0000 mg | Freq: Once | INTRAVENOUS | Status: AC
  Administered 2021-03-09: 510 mg via INTRAVENOUS
  Filled 2021-03-09: qty 17

## 2021-03-09 MED ORDER — SODIUM CHLORIDE 0.9 % IV SOLN
Freq: Once | INTRAVENOUS | Status: AC
Start: 2021-03-09 — End: 2021-03-09

## 2021-03-09 NOTE — Patient Instructions (Signed)

## 2021-04-01 ENCOUNTER — Ambulatory Visit: Payer: 59

## 2021-04-10 ENCOUNTER — Ambulatory Visit: Payer: 59

## 2021-04-10 ENCOUNTER — Inpatient Hospital Stay: Payer: 59 | Attending: Hematology & Oncology

## 2021-04-10 ENCOUNTER — Encounter: Payer: Self-pay | Admitting: Family

## 2021-04-10 ENCOUNTER — Other Ambulatory Visit: Payer: Self-pay

## 2021-04-10 ENCOUNTER — Inpatient Hospital Stay (HOSPITAL_BASED_OUTPATIENT_CLINIC_OR_DEPARTMENT_OTHER): Payer: 59 | Admitting: Family

## 2021-04-10 VITALS — BP 157/82 | HR 82 | Temp 97.8°F | Resp 18 | Wt 227.8 lb

## 2021-04-10 DIAGNOSIS — D509 Iron deficiency anemia, unspecified: Secondary | ICD-10-CM | POA: Diagnosis not present

## 2021-04-10 DIAGNOSIS — D500 Iron deficiency anemia secondary to blood loss (chronic): Secondary | ICD-10-CM

## 2021-04-10 DIAGNOSIS — Z79899 Other long term (current) drug therapy: Secondary | ICD-10-CM | POA: Insufficient documentation

## 2021-04-10 LAB — IRON AND TIBC
Iron: 22 ug/dL — ABNORMAL LOW (ref 41–142)
Saturation Ratios: 5 % — ABNORMAL LOW (ref 21–57)
TIBC: 416 ug/dL (ref 236–444)
UIBC: 395 ug/dL — ABNORMAL HIGH (ref 120–384)

## 2021-04-10 LAB — CMP (CANCER CENTER ONLY)
ALT: 20 U/L (ref 0–44)
AST: 13 U/L — ABNORMAL LOW (ref 15–41)
Albumin: 4.2 g/dL (ref 3.5–5.0)
Alkaline Phosphatase: 55 U/L (ref 38–126)
Anion gap: 10 (ref 5–15)
BUN: 14 mg/dL (ref 6–20)
CO2: 27 mmol/L (ref 22–32)
Calcium: 9.5 mg/dL (ref 8.9–10.3)
Chloride: 106 mmol/L (ref 98–111)
Creatinine: 0.89 mg/dL (ref 0.44–1.00)
GFR, Estimated: >60 mL/min (ref >60)
Glucose, Bld: 128 mg/dL — ABNORMAL HIGH (ref 70–99)
Potassium: 4.2 mmol/L (ref 3.5–5.1)
Sodium: 143 mmol/L (ref 135–145)
Total Bilirubin: 0.2 mg/dL — ABNORMAL LOW (ref 0.3–1.2)
Total Protein: 7 g/dL (ref 6.5–8.1)

## 2021-04-10 LAB — RETICULOCYTES
Immature Retic Fract: 26.2 % — ABNORMAL HIGH (ref 2.3–15.9)
RBC.: 3.82 MIL/uL — ABNORMAL LOW (ref 3.87–5.11)
Retic Count, Absolute: 12.5 10*3/uL — ABNORMAL LOW (ref 19.0–186.0)
Retic Ct Pct: 3.3 % — ABNORMAL HIGH (ref 0.4–3.1)

## 2021-04-10 LAB — CBC WITH DIFFERENTIAL (CANCER CENTER ONLY)
Abs Immature Granulocytes: 0.05 10*3/uL (ref 0.00–0.07)
Basophils Absolute: 0.1 10*3/uL (ref 0.0–0.1)
Basophils Relative: 2 %
Eosinophils Absolute: 0.3 10*3/uL (ref 0.0–0.5)
Eosinophils Relative: 5 %
HCT: 32.6 % — ABNORMAL LOW (ref 36.0–46.0)
Hemoglobin: 10.1 g/dL — ABNORMAL LOW (ref 12.0–15.0)
Immature Granulocytes: 1 %
Lymphocytes Relative: 28 %
Lymphs Abs: 1.5 10*3/uL (ref 0.7–4.0)
MCH: 26.4 pg (ref 26.0–34.0)
MCHC: 31 g/dL (ref 30.0–36.0)
MCV: 85.1 fL (ref 80.0–100.0)
Monocytes Absolute: 0.5 10*3/uL (ref 0.1–1.0)
Monocytes Relative: 9 %
Neutro Abs: 3 10*3/uL (ref 1.7–7.7)
Neutrophils Relative %: 55 %
Platelet Count: 365 10*3/uL (ref 150–400)
RBC: 3.83 MIL/uL — ABNORMAL LOW (ref 3.87–5.11)
RDW: 15.7 % — ABNORMAL HIGH (ref 11.5–15.5)
WBC Count: 5.4 10*3/uL (ref 4.0–10.5)
nRBC: 0 % (ref 0.0–0.2)

## 2021-04-10 LAB — FERRITIN: Ferritin: 26 ng/mL (ref 11–307)

## 2021-04-10 NOTE — Progress Notes (Signed)
Hematology and Oncology Follow Up Visit  Amanda [name] [id number] [birth year] 55 y.o. 04/10/2021   Principle Diagnosis:  Chronic iron deficiency anemia     Current Therapy:        IV iron as indicated   Interim History:  Ms. Amanda [name] is here today with her daughter for follow-up. She is symptomatic with fatigue, SOB with exertion and restless leg at night.  She states that the fatigue and SOB are exacerbated by anxiety.  She has had issues with elevated BP and chest tightness off and on.  She has GERD and notes episodes of nausea and vomiting at times. She has history of hiatal hernia.  No fever, chills, cough, rash, chest pain, palpitations, abdominal pain or changes in bowel or bladder habits.  She has intermittent constipation and diarrhea which she states resolves without intervention. This is also exacerbated by stress.  She had some generalized aches and pains after her last IV iron.  No swelling, tenderness, numbness or tingling in her extremities.  No falls or syncopal episodes to report.  She has maintained a good appetite and is doing her best to stay well hydrated. Her weight is stable at 227 lbs.   ECOG Performance Status: 1 - Symptomatic but completely ambulatory  Medications:  Allergies as of 04/10/2021   No Known Allergies      Medication List        Accurate as of April 10, 2021  8:50 AM. If you have any questions, ask your nurse or doctor.          b complex vitamins tablet Take 1 tablet by mouth daily.   cetirizine 10 MG tablet Commonly known as: ZYRTEC Take 10 mg by mouth daily.   traMADol 50 MG tablet Commonly known as: ULTRAM Take 1 tablet (50 mg total) by mouth every 6 (six) hours as needed for severe pain.   Vitamin B-12 1000 MCG Subl Place 1 tablet (1,000 mcg total) under the tongue daily.   Vitamin D3 50 MCG (2000 UT) capsule Take 1 capsule (2,000 Units total) by mouth daily.   zolpidem 5 MG tablet Commonly known as: AMBIEN Take 1  tablet (5 mg total) by mouth at bedtime as needed for sleep.        Allergies: No Known Allergies  Past Medical History, Surgical history, Social history, and Family History were reviewed and updated.  Review of Systems: All other 10 point review of systems is negative.   Physical Exam:  weight is 227 lb 12.8 oz (103.3 kg). Her oral temperature is 97.8 F (36.6 C). Her blood pressure is 157/82 (abnormal) and her pulse is 82. Her respiration is 18 and oxygen saturation is 100%.   Wt Readings from Last 3 Encounters:  04/10/21 227 lb 12.8 oz (103.3 kg)  11/18/20 227 lb 1.9 oz (103 kg)  10/17/20 224 lb (101.6 kg)    Ocular: Sclerae unicteric, pupils equal, round and reactive to light Ear-nose-throat: Oropharynx clear, dentition fair Lymphatic: No cervical or supraclavicular adenopathy Lungs no rales or rhonchi, good excursion bilaterally Heart regular rate and rhythm, no murmur appreciated Abd soft, nontender, positive bowel sounds MSK no focal spinal tenderness, no joint edema Neuro: non-focal, well-oriented, appropriate affect Breasts: Deferred   Lab Results  Component Value Date   WBC 5.0 02/27/2021   HGB 8.8 (L) 02/27/2021   HCT 29.1 (L) 02/27/2021   MCV 82.2 02/27/2021   PLT 338 02/27/2021   Lab Results  Component Value Date   FERRITIN 6 (  L) 02/27/2021   IRON 20 (L) 02/27/2021   TIBC 446 (H) 02/27/2021   UIBC 426 (H) 02/27/2021   IRONPCTSAT 5 (L) 02/27/2021   Lab Results  Component Value Date   RETICCTPCT 2.9 02/27/2021   RBC 3.65 (L) 02/27/2021   No results found for: KPAFRELGTCHN, LAMBDASER, KAPLAMBRATIO No results found for: IGGSERUM, IGA, IGMSERUM No results found for: Marda Stalker, SPEI   Chemistry      Component Value Date/Time   NA 144 02/27/2021 0819   K 4.3 02/27/2021 0819   CL 107 02/27/2021 0819   CO2 29 02/27/2021 0819   BUN 14 02/27/2021 0819   CREATININE 0.95 02/27/2021 0819       Component Value Date/Time   CALCIUM 9.9 02/27/2021 0819   ALKPHOS 53 02/27/2021 0819   AST 18 02/27/2021 0819   ALT 21 02/27/2021 0819   BILITOT 0.3 02/27/2021 0819       Impression and Plan: Ms. Amanda [name] is a pleasant 55 yo [place] female with chronic iron deficiency anemia felt to be due to intermittent GI blood loss. Iron studies pending. We will replace if needed.  Lab only in 6 weeks and follow-up in 12 weeks.   Eileen Stanford, NP 12/9/20228:50 AM

## 2021-04-16 ENCOUNTER — Other Ambulatory Visit: Payer: Self-pay

## 2021-04-16 ENCOUNTER — Inpatient Hospital Stay: Payer: 59

## 2021-04-16 VITALS — BP 138/58 | HR 89 | Temp 97.9°F | Resp 17

## 2021-04-16 DIAGNOSIS — D500 Iron deficiency anemia secondary to blood loss (chronic): Secondary | ICD-10-CM

## 2021-04-16 DIAGNOSIS — D509 Iron deficiency anemia, unspecified: Secondary | ICD-10-CM | POA: Diagnosis not present

## 2021-04-16 MED ORDER — SODIUM CHLORIDE 0.9 % IV SOLN
510.0000 mg | Freq: Once | INTRAVENOUS | Status: AC
  Administered 2021-04-16: 510 mg via INTRAVENOUS
  Filled 2021-04-16: qty 17

## 2021-04-16 MED ORDER — SODIUM CHLORIDE 0.9 % IV SOLN: Freq: Once | INTRAVENOUS | Status: AC

## 2021-04-16 NOTE — Patient Instructions (Signed)

## 2021-04-23 ENCOUNTER — Inpatient Hospital Stay: Payer: 59

## 2021-04-23 ENCOUNTER — Other Ambulatory Visit: Payer: Self-pay

## 2021-04-23 VITALS — BP 117/64 | HR 93 | Temp 98.6°F | Resp 19

## 2021-04-23 DIAGNOSIS — D500 Iron deficiency anemia secondary to blood loss (chronic): Secondary | ICD-10-CM

## 2021-04-23 DIAGNOSIS — D509 Iron deficiency anemia, unspecified: Secondary | ICD-10-CM | POA: Diagnosis not present

## 2021-04-23 MED ORDER — SODIUM CHLORIDE 0.9 % IV SOLN
510.0000 mg | Freq: Once | INTRAVENOUS | Status: AC
  Administered 2021-04-23: 14:00:00 510 mg via INTRAVENOUS
  Filled 2021-04-23: qty 17

## 2021-04-23 MED ORDER — SODIUM CHLORIDE 0.9 % IV SOLN: Freq: Once | INTRAVENOUS | Status: AC

## 2021-04-23 NOTE — Progress Notes (Signed)
Pt declined to stay for post infusion observation period. Pt stated she has tolerated medication multiple times prior without difficulty. Pt aware to call clinic with any questions or concerns. Pt verbalized understanding and had no further questions.  ? ?

## 2021-04-29 ENCOUNTER — Other Ambulatory Visit: Payer: Self-pay

## 2021-04-29 ENCOUNTER — Ambulatory Visit (INDEPENDENT_AMBULATORY_CARE_PROVIDER_SITE_OTHER): Payer: 59 | Admitting: Internal Medicine

## 2021-04-29 ENCOUNTER — Encounter: Payer: Self-pay | Admitting: Internal Medicine

## 2021-04-29 VITALS — BP 142/88 | HR 86 | Temp 98.2°F | Ht 62.0 in | Wt 228.0 lb

## 2021-04-29 DIAGNOSIS — E559 Vitamin D deficiency, unspecified: Secondary | ICD-10-CM | POA: Diagnosis not present

## 2021-04-29 DIAGNOSIS — K219 Gastro-esophageal reflux disease without esophagitis: Secondary | ICD-10-CM

## 2021-04-29 DIAGNOSIS — R0609 Other forms of dyspnea: Secondary | ICD-10-CM

## 2021-04-29 DIAGNOSIS — E538 Deficiency of other specified B group vitamins: Secondary | ICD-10-CM

## 2021-04-29 DIAGNOSIS — G4700 Insomnia, unspecified: Secondary | ICD-10-CM

## 2021-04-29 DIAGNOSIS — G2581 Restless legs syndrome: Secondary | ICD-10-CM

## 2021-04-29 DIAGNOSIS — R5382 Chronic fatigue, unspecified: Secondary | ICD-10-CM | POA: Diagnosis not present

## 2021-04-29 DIAGNOSIS — D500 Iron deficiency anemia secondary to blood loss (chronic): Secondary | ICD-10-CM

## 2021-04-29 DIAGNOSIS — R0683 Snoring: Secondary | ICD-10-CM

## 2021-04-29 DIAGNOSIS — M797 Fibromyalgia: Secondary | ICD-10-CM

## 2021-04-29 LAB — COMPREHENSIVE METABOLIC PANEL
ALT: 20 U/L (ref 0–35)
AST: 18 U/L (ref 0–37)
Albumin: 4.1 g/dL (ref 3.5–5.2)
Alkaline Phosphatase: 51 U/L (ref 39–117)
BUN: 10 mg/dL (ref 6–23)
CO2: 26 mEq/L (ref 19–32)
Calcium: 9.6 mg/dL (ref 8.4–10.5)
Chloride: 103 mEq/L (ref 96–112)
Creatinine, Ser: 0.87 mg/dL (ref 0.40–1.20)
GFR: 75.78 mL/min (ref >60.00)
Glucose, Bld: 98 mg/dL (ref 70–99)
Potassium: 4.1 mEq/L (ref 3.5–5.1)
Sodium: 139 mEq/L (ref 135–145)
Total Bilirubin: 0.3 mg/dL (ref 0.2–1.2)
Total Protein: 7.2 g/dL (ref 6.0–8.3)

## 2021-04-29 LAB — VITAMIN B12: Vitamin B-12: 267 pg/mL (ref 211–911)

## 2021-04-29 LAB — VITAMIN D 25 HYDROXY (VIT D DEFICIENCY, FRACTURES): VITD: 21.78 ng/mL — ABNORMAL LOW (ref 30.00–100.00)

## 2021-04-29 LAB — T4, FREE: Free T4: 0.78 ng/dL (ref 0.60–1.60)

## 2021-04-29 LAB — TSH: TSH: 3.46 u[IU]/mL (ref 0.35–5.50)

## 2021-04-29 MED ORDER — ESOMEPRAZOLE MAGNESIUM 40 MG PO CPDR: 40.0000 mg | DELAYED_RELEASE_CAPSULE | Freq: Every day | ORAL | 3 refills | Status: DC

## 2021-04-29 MED ORDER — TRAMADOL HCL 50 MG PO TABS: 25.0000 mg | ORAL_TABLET | Freq: Four times a day (QID) | ORAL | 5 refills | Status: DC | PRN

## 2021-04-29 MED ORDER — ZOLPIDEM TARTRATE 5 MG PO TABS
2.5000 mg | ORAL_TABLET | Freq: Every evening | ORAL | 1 refills | Status: DC | PRN
Start: 2021-04-29 — End: 2021-07-19

## 2021-04-29 NOTE — Assessment & Plan Note (Addendum)
Worsening CFS - multifactorial: anemia, obesity, deconditioning, stress, r/o OSA and RLS Pulm ref to r/o OSA Start walking Cont w/iron infusions Obtain labs including Vit B12

## 2021-04-29 NOTE — Assessment & Plan Note (Signed)
Cont w/Vit D 2000 iu/d

## 2021-04-29 NOTE — Assessment & Plan Note (Signed)
°  Use Tramadol prn for pain/RLS Rare Zolpidem prn  Potential benefits of a long term benzodiazepines  use as well as potential risks  and complications were explained to the patient and were aknowledged.

## 2021-04-29 NOTE — Assessment & Plan Note (Signed)
Cont w/B12 SL Check B12 level

## 2021-04-29 NOTE — Addendum Note (Signed)
Addended by: [name] on: 04/29/2021 05:07 PM   Modules accepted: Orders, Level of Service

## 2021-04-29 NOTE — Assessment & Plan Note (Addendum)
Worse.  Use Tramadol more often.  Potential benefits of a long term opioids use as well as potential risks (i.e. addiction risk, apnea etc) and complications (i.e. Somnolence, constipation and others) were explained to the patient and were aknowledged.

## 2021-04-29 NOTE — Assessment & Plan Note (Signed)
F/u w/Dr Myna Hidalgo On IV iron infusions

## 2021-04-29 NOTE — Assessment & Plan Note (Signed)
Worse.  She stopped using Protonix.  She thought it stopped working.  We will start Nexium.

## 2021-04-29 NOTE — Progress Notes (Addendum)
Subjective:  Patient ID: Amanda [name], female    DOB: 09-Jun-1965  Age: 55 y.o. MRN: [id number]  CC: Follow-up   HPI Amanda [name] presents for SOB, fatigue, dizzy F/u dizzyness, anemia, anxiety, RLS.  She has a problem with snoring.  She is always tired and short of breath on exertion even while receiving frequent iron infusions.  She has a problem with indigestion She is here with her dtr Amanda [name], who helps with history.  The patient is taking care of her Amanda [name] who is 55 years old.  Outpatient Medications Prior to Visit  Medication Sig Dispense Refill   b complex vitamins tablet Take 1 tablet by mouth daily. 100 tablet 3   cetirizine (ZYRTEC) 10 MG tablet Take 10 mg by mouth daily.     Cholecalciferol (VITAMIN D3) 2000 units capsule Take 1 capsule (2,000 Units total) by mouth daily. 100 capsule 3   Cyanocobalamin (VITAMIN B-12) 1000 MCG SUBL Place 1 tablet (1,000 mcg total) under the tongue daily. 100 tablet 3   traMADol (ULTRAM) 50 MG tablet Take 1 tablet (50 mg total) by mouth every 6 (six) hours as needed for severe pain. 60 tablet 1   zolpidem (AMBIEN) 5 MG tablet Take 1 tablet (5 mg total) by mouth at bedtime as needed for sleep. 30 tablet 1   No facility-administered medications prior to visit.    ROS: Review of Systems  Constitutional:  Positive for fatigue. Negative for activity change, appetite change, chills and unexpected weight change.  HENT:  Negative for congestion, mouth sores and sinus pressure.   Eyes:  Negative for visual disturbance.  Respiratory:  Positive for shortness of breath. Negative for cough and chest tightness.   Gastrointestinal:  Negative for abdominal pain, blood in stool, nausea and vomiting.  Genitourinary:  Negative for difficulty urinating, frequency and vaginal pain.  Musculoskeletal:  Positive for arthralgias and back pain. Negative for gait problem.  Skin:  Negative for pallor and rash.  Neurological:  Positive for weakness. Negative for  dizziness, tremors, numbness and headaches.  Psychiatric/Behavioral:  Positive for decreased concentration, dysphoric mood and sleep disturbance. Negative for confusion and suicidal ideas. The patient is nervous/anxious.    Objective:  BP (!) 142/88 (BP Location: Left Arm, Patient Position: Sitting, Cuff Size: Large)    Pulse 86    Temp 98.2 F (36.8 C) (Oral)    Ht 5\' 2"  (1.575 m)    Wt 228 lb (103.4 kg)    SpO2 99%    BMI 41.70 kg/m   BP Readings from Last 3 Encounters:  04/29/21 (!) 142/88  04/23/21 117/64  04/16/21 (!) 138/58    Wt Readings from Last 3 Encounters:  04/29/21 228 lb (103.4 kg)  04/10/21 227 lb 12.8 oz (103.3 kg)  11/18/20 227 lb 1.9 oz (103 kg)    Physical Exam Constitutional:      General: She is not in acute distress.    Appearance: She is well-developed. She is obese.  HENT:     Head: Normocephalic.     Right Ear: External ear normal.     Left Ear: External ear normal.     Nose: Nose normal.  Eyes:     General:        Right eye: No discharge.        Left eye: No discharge.     Conjunctiva/sclera: Conjunctivae normal.     Pupils: Pupils are equal, round, and reactive to light.  Neck:  Thyroid: No thyromegaly.     Vascular: No JVD.     Trachea: No tracheal deviation.  Cardiovascular:     Rate and Rhythm: Normal rate and regular rhythm.     Heart sounds: Normal heart sounds.  Pulmonary:     Effort: No respiratory distress.     Breath sounds: No stridor. No wheezing.  Abdominal:     General: Bowel sounds are normal. There is no distension.     Palpations: Abdomen is soft. There is no mass.     Tenderness: There is no abdominal tenderness. There is no guarding or rebound.  Musculoskeletal:        General: No tenderness.     Cervical back: Normal range of motion and neck supple. No rigidity.  Lymphadenopathy:     Cervical: No cervical adenopathy.  Skin:    Findings: No erythema or rash.  Neurological:     Cranial Nerves: No cranial nerve  deficit.     Motor: No abnormal muscle tone.     Coordination: Coordination normal.     Gait: Gait normal.     Deep Tendon Reflexes: Reflexes normal.  Psychiatric:        Behavior: Behavior normal.        Thought Content: Thought content normal.        Judgment: Judgment normal.    Lab Results  Component Value Date   WBC 5.4 04/10/2021   HGB 10.1 (L) 04/10/2021   HCT 32.6 (L) 04/10/2021   PLT 365 04/10/2021   GLUCOSE 98 04/29/2021   ALT 20 04/29/2021   AST 18 04/29/2021   NA 139 04/29/2021   K 4.1 04/29/2021   CL 103 04/29/2021   CREATININE 0.87 04/29/2021   BUN 10 04/29/2021   CO2 26 04/29/2021   TSH 3.46 04/29/2021   INR 1.03 10/14/2017    No results found.  Assessment & Plan:   Problem List Items Addressed This Visit     ANEMIA-IRON DEFICIENCY    F/u w/Dr Amanda [name] On IV iron infusions      B12 deficiency    Cont w/B12 SL Check B12 level      Relevant Orders   Vitamin B12 (Completed)   VITAMIN D 25 Hydroxy (Vit-D Deficiency, Fractures) (Completed)   TSH (Completed)   T4, free (Completed)   Comprehensive metabolic panel (Completed)   Fatigue    Worsening CFS - multifactorial: anemia, obesity, deconditioning, stress, r/o OSA and RLS Pulm ref to r/o OSA Start walking Cont w/iron infusions Obtain labs including Vit B12      Relevant Orders   Vitamin B12 (Completed)   VITAMIN D 25 Hydroxy (Vit-D Deficiency, Fractures) (Completed)   TSH (Completed)   T4, free (Completed)   Comprehensive metabolic panel (Completed)   Ambulatory referral to Pulmonology   Fibromyalgia syndrome    Worse.  Use Tramadol more often.  Potential benefits of a long term opioids use as well as potential risks (i.e. addiction risk, apnea etc) and complications (i.e. Somnolence, constipation and others) were explained to the patient and were aknowledged.       Relevant Medications   traMADol (ULTRAM) 50 MG tablet   GERD (gastroesophageal reflux disease)    Worse.  She stopped  using Protonix.  She thought it stopped working.  We will start Nexium.      Relevant Medications   esomeprazole (NEXIUM) 40 MG capsule   Insomnia     Use Tramadol prn for pain/RLS Rare Zolpidem prn  Potential benefits  of a long term benzodiazepines  use as well as potential risks  and complications were explained to the patient and were aknowledged.       RLS (restless legs syndrome)    Probable Use Tramadol prn  Potential benefits of a long term opioids use as well as potential risks (i.e. addiction risk, apnea etc) and complications (i.e. Somnolence, constipation and others) were explained to the patient and were aknowledged.      Vitamin D deficiency    Cont w/Vit D 2000 iu/d      Other Visit Diagnoses     DOE (dyspnea on exertion)    -  Primary   Relevant Orders   Vitamin B12 (Completed)   VITAMIN D 25 Hydroxy (Vit-D Deficiency, Fractures) (Completed)   TSH (Completed)   T4, free (Completed)   Comprehensive metabolic panel (Completed)   Ambulatory referral to Pulmonology   Snoring       Relevant Orders   Ambulatory referral to Pulmonology         Meds ordered this encounter  Medications   traMADol (ULTRAM) 50 MG tablet    Sig: Take 0.5-1 tablets (25-50 mg total) by mouth every 6 (six) hours as needed for severe pain.    Dispense:  120 tablet    Refill:  5    Please schedule a follow-up office visit   esomeprazole (NEXIUM) 40 MG capsule    Sig: Take 1 capsule (40 mg total) by mouth daily.    Dispense:  90 capsule    Refill:  3   zolpidem (AMBIEN) 5 MG tablet    Sig: Take 0.5-1 tablets (2.5-5 mg total) by mouth at bedtime as needed for sleep.    Dispense:  30 tablet    Refill:  1      Follow-up: Return in about 6 weeks (around 06/10/2021) for a follow-up visit.  Sonda Primes, MD

## 2021-04-29 NOTE — Assessment & Plan Note (Signed)
Probable Use Tramadol prn  Potential benefits of a long term opioids use as well as potential risks (i.e. addiction risk, apnea etc) and complications (i.e. Somnolence, constipation and others) were explained to the patient and were aknowledged.

## 2021-04-30 ENCOUNTER — Encounter: Payer: Self-pay | Admitting: Family

## 2021-04-30 ENCOUNTER — Other Ambulatory Visit: Payer: Self-pay | Admitting: Internal Medicine

## 2021-04-30 MED ORDER — VITAMIN D (ERGOCALCIFEROL) 1.25 MG (50000 UNIT) PO CAPS: 50000.0000 [IU] | ORAL_CAPSULE | ORAL | 0 refills | Status: DC

## 2021-04-30 MED ORDER — VITAMIN B-12 1000 MCG SL SUBL: 2.0000 | SUBLINGUAL_TABLET | Freq: Every day | SUBLINGUAL | 3 refills | Status: AC

## 2021-05-21 ENCOUNTER — Encounter: Payer: Self-pay | Admitting: Family

## 2021-05-22 ENCOUNTER — Inpatient Hospital Stay: Payer: 59

## 2021-06-04 ENCOUNTER — Institutional Professional Consult (permissible substitution): Payer: 59 | Admitting: Pulmonary Disease

## 2021-06-09 ENCOUNTER — Ambulatory Visit (INDEPENDENT_AMBULATORY_CARE_PROVIDER_SITE_OTHER): Payer: 59 | Admitting: Internal Medicine

## 2021-06-09 ENCOUNTER — Encounter: Payer: Self-pay | Admitting: Internal Medicine

## 2021-06-09 ENCOUNTER — Other Ambulatory Visit: Payer: Self-pay

## 2021-06-09 VITALS — BP 138/82 | HR 91 | Temp 98.0°F | Ht 62.0 in | Wt 227.8 lb

## 2021-06-09 DIAGNOSIS — M797 Fibromyalgia: Secondary | ICD-10-CM

## 2021-06-09 DIAGNOSIS — E538 Deficiency of other specified B group vitamins: Secondary | ICD-10-CM | POA: Diagnosis not present

## 2021-06-09 DIAGNOSIS — D500 Iron deficiency anemia secondary to blood loss (chronic): Secondary | ICD-10-CM

## 2021-06-09 DIAGNOSIS — D649 Anemia, unspecified: Secondary | ICD-10-CM

## 2021-06-09 DIAGNOSIS — G4700 Insomnia, unspecified: Secondary | ICD-10-CM

## 2021-06-09 DIAGNOSIS — E669 Obesity, unspecified: Secondary | ICD-10-CM

## 2021-06-09 LAB — IRON,TIBC AND FERRITIN PANEL
%SAT: 10 % (calc) — ABNORMAL LOW (ref 16–45)
Ferritin: 22 ng/mL (ref 16–232)
Iron: 40 ug/dL — ABNORMAL LOW (ref 45–160)
TIBC: 421 mcg/dL (calc) (ref 250–450)

## 2021-06-09 LAB — CBC WITH DIFFERENTIAL/PLATELET
Basophils Absolute: 0.1 10*3/uL (ref 0.0–0.1)
Basophils Relative: 0.9 % (ref 0.0–3.0)
Eosinophils Absolute: 0.2 10*3/uL (ref 0.0–0.7)
Eosinophils Relative: 3.4 % (ref 0.0–5.0)
HCT: 43 % (ref 36.0–46.0)
Hemoglobin: 13.8 g/dL (ref 12.0–15.0)
Lymphocytes Relative: 29.1 % (ref 12.0–46.0)
Lymphs Abs: 2.1 10*3/uL (ref 0.7–4.0)
MCHC: 32.2 g/dL (ref 30.0–36.0)
MCV: 81.4 fl (ref 78.0–100.0)
Monocytes Absolute: 0.6 10*3/uL (ref 0.1–1.0)
Monocytes Relative: 7.6 % (ref 3.0–12.0)
Neutro Abs: 4.3 10*3/uL (ref 1.4–7.7)
Neutrophils Relative %: 59 % (ref 43.0–77.0)
Platelets: 273 10*3/uL (ref 150.0–400.0)
RBC: 5.29 Mil/uL — ABNORMAL HIGH (ref 3.87–5.11)
RDW: 17.9 % — ABNORMAL HIGH (ref 11.5–15.5)
WBC: 7.3 10*3/uL (ref 4.0–10.5)

## 2021-06-09 MED ORDER — PHENTERMINE HCL 37.5 MG PO TABS: 37.5000 mg | ORAL_TABLET | Freq: Every day | ORAL | 2 refills | Status: DC

## 2021-06-09 NOTE — Assessment & Plan Note (Signed)
Use Tramadol prn for pain/RLS

## 2021-06-09 NOTE — Assessment & Plan Note (Signed)
Worse Start Adipex po  Potential benefits of a long term adipex use as well as potential risks  and complications were explained to the patient and were aknowledged.

## 2021-06-09 NOTE — Assessment & Plan Note (Signed)
On B12 

## 2021-06-09 NOTE — Progress Notes (Signed)
Subjective:  Patient ID: Amanda [name], female    DOB: 12/03/1965  Age: 56 y.o. MRN: [id number]  CC: Follow-up   HPI Amanda [name] presents for chronic pain, CFS, insomnia  Outpatient Medications Prior to Visit  Medication Sig Dispense Refill   b complex vitamins tablet Take 1 tablet by mouth daily. 100 tablet 3   cetirizine (ZYRTEC) 10 MG tablet Take 10 mg by mouth daily.     Cholecalciferol (VITAMIN D3) 2000 units capsule Take 1 capsule (2,000 Units total) by mouth daily. 100 capsule 3   Cyanocobalamin (VITAMIN B-12) 1000 MCG SUBL Place 2 tablets (2,000 mcg total) under the tongue daily. 100 tablet 3   esomeprazole (NEXIUM) 40 MG capsule Take 1 capsule (40 mg total) by mouth daily. 90 capsule 3   traMADol (ULTRAM) 50 MG tablet Take 0.5-1 tablets (25-50 mg total) by mouth every 6 (six) hours as needed for severe pain. 120 tablet 5   Vitamin D, Ergocalciferol, (DRISDOL) 1.25 MG (50000 UNIT) CAPS capsule Take 1 capsule (50,000 Units total) by mouth every 7 (seven) days. 8 capsule 0   zolpidem (AMBIEN) 5 MG tablet Take 0.5-1 tablets (2.5-5 mg total) by mouth at bedtime as needed for sleep. 30 tablet 1   No facility-administered medications prior to visit.    ROS: Review of Systems  Constitutional:  Negative for activity change, appetite change, chills, fatigue and unexpected weight change.  HENT:  Negative for congestion, mouth sores and sinus pressure.   Eyes:  Negative for visual disturbance.  Respiratory:  Negative for cough and chest tightness.   Gastrointestinal:  Negative for abdominal pain and nausea.  Genitourinary:  Negative for difficulty urinating, frequency and vaginal pain.  Musculoskeletal:  Positive for arthralgias and gait problem.  Skin:  Negative for pallor and rash.  Neurological:  Negative for dizziness, tremors, weakness, numbness and headaches.  Psychiatric/Behavioral:  Negative for confusion, sleep disturbance and suicidal ideas.    Objective:  BP 138/82 (BP  Location: Left Arm, Patient Position: Sitting, Cuff Size: Normal)    Pulse 91    Temp 98 F (36.7 C) (Oral)    Ht 5\' 2"  (1.575 m)    Wt 227 lb 12.8 oz (103.3 kg)    SpO2 98%    BMI 41.67 kg/m   BP Readings from Last 3 Encounters:  06/09/21 138/82  04/29/21 (!) 142/88  04/23/21 117/64    Wt Readings from Last 3 Encounters:  06/09/21 227 lb 12.8 oz (103.3 kg)  04/29/21 228 lb (103.4 kg)  04/10/21 227 lb 12.8 oz (103.3 kg)    Physical Exam Constitutional:      General: She is not in acute distress.    Appearance: She is well-developed.  HENT:     Head: Normocephalic.     Right Ear: External ear normal.     Left Ear: External ear normal.     Nose: Nose normal.  Eyes:     General:        Right eye: No discharge.        Left eye: No discharge.     Conjunctiva/sclera: Conjunctivae normal.     Pupils: Pupils are equal, round, and reactive to light.  Neck:     Thyroid: No thyromegaly.     Vascular: No JVD.     Trachea: No tracheal deviation.  Cardiovascular:     Rate and Rhythm: Normal rate and regular rhythm.     Heart sounds: Normal heart sounds.  Pulmonary:  Effort: No respiratory distress.     Breath sounds: No stridor. No wheezing.  Abdominal:     General: Bowel sounds are normal. There is no distension.     Palpations: Abdomen is soft. There is no mass.     Tenderness: There is no abdominal tenderness. There is no guarding or rebound.  Musculoskeletal:        General: No tenderness.     Cervical back: Normal range of motion and neck supple. No rigidity.  Lymphadenopathy:     Cervical: No cervical adenopathy.  Skin:    Findings: No erythema or rash.  Neurological:     Cranial Nerves: No cranial nerve deficit.     Motor: No abnormal muscle tone.     Coordination: Coordination normal.     Gait: Gait normal.     Deep Tendon Reflexes: Reflexes normal.  Psychiatric:        Behavior: Behavior normal.        Thought Content: Thought content normal.         Judgment: Judgment normal.    Lab Results  Component Value Date   WBC 7.3 06/09/2021   HGB 13.8 06/09/2021   HCT 43.0 06/09/2021   PLT 273.0 06/09/2021   GLUCOSE 98 04/29/2021   ALT 20 04/29/2021   AST 18 04/29/2021   NA 139 04/29/2021   K 4.1 04/29/2021   CL 103 04/29/2021   CREATININE 0.87 04/29/2021   BUN 10 04/29/2021   CO2 26 04/29/2021   TSH 3.46 04/29/2021   INR 1.03 10/14/2017    No results found.  Assessment & Plan:   Problem List Items Addressed This Visit     ANEMIA-IRON DEFICIENCY    Check CBC, iron      B12 deficiency    On B12      Fibromyalgia syndrome    Use Tramadol more often.  Potential benefits of a long term opioids use as well as potential risks (i.e. addiction risk, apnea etc) and complications (i.e. Somnolence, constipation and others) were explained to the patient and were aknowledged.        Insomnia    Use Tramadol prn for pain/RLS      Obesity (BMI 30-39.9)    Worse Start Adipex po  Potential benefits of a long term adipex use as well as potential risks  and complications were explained to the patient and were aknowledged.       Relevant Medications   phentermine (ADIPEX-P) 37.5 MG tablet   Symptomatic anemia - Primary   Relevant Orders   Iron, TIBC and Ferritin Panel (Completed)   CBC with Differential/Platelet (Completed)      Meds ordered this encounter  Medications   phentermine (ADIPEX-P) 37.5 MG tablet    Sig: Take 1 tablet (37.5 mg total) by mouth daily before breakfast.    Dispense:  30 tablet    Refill:  2      Follow-up: Return in about 3 months (around 09/06/2021) for a follow-up visit.  Sonda Primes, MD

## 2021-06-09 NOTE — Assessment & Plan Note (Signed)
Use Tramadol more often.  Potential benefits of a long term opioids use as well as potential risks (i.e. addiction risk, apnea etc) and complications (i.e. Somnolence, constipation and others) were explained to the patient and were aknowledged.

## 2021-06-09 NOTE — Assessment & Plan Note (Signed)
Check CBC, iron 

## 2021-06-25 ENCOUNTER — Other Ambulatory Visit: Payer: Self-pay | Admitting: Internal Medicine

## 2021-07-03 ENCOUNTER — Ambulatory Visit: Payer: 59 | Admitting: Family

## 2021-07-03 ENCOUNTER — Other Ambulatory Visit: Payer: 59

## 2021-07-11 ENCOUNTER — Other Ambulatory Visit: Payer: Self-pay | Admitting: Internal Medicine

## 2021-07-14 ENCOUNTER — Other Ambulatory Visit: Payer: Self-pay | Admitting: Internal Medicine

## 2021-07-14 MED ORDER — PHENTERMINE HCL 37.5 MG PO TABS: 37.5000 mg | ORAL_TABLET | Freq: Every day | ORAL | 2 refills | Status: DC

## 2021-07-15 ENCOUNTER — Other Ambulatory Visit: Payer: Self-pay | Admitting: Internal Medicine

## 2021-07-17 ENCOUNTER — Other Ambulatory Visit: Payer: Self-pay

## 2021-07-17 ENCOUNTER — Inpatient Hospital Stay: Payer: 59 | Attending: Hematology & Oncology

## 2021-07-17 DIAGNOSIS — D500 Iron deficiency anemia secondary to blood loss (chronic): Secondary | ICD-10-CM

## 2021-07-17 DIAGNOSIS — D509 Iron deficiency anemia, unspecified: Secondary | ICD-10-CM | POA: Insufficient documentation

## 2021-07-17 LAB — CBC WITH DIFFERENTIAL (CANCER CENTER ONLY)
Abs Immature Granulocytes: 0.02 10*3/uL (ref 0.00–0.07)
Basophils Absolute: 0.1 10*3/uL (ref 0.0–0.1)
Basophils Relative: 2 %
Eosinophils Absolute: 0.2 10*3/uL (ref 0.0–0.5)
Eosinophils Relative: 4 %
HCT: 40.6 % (ref 36.0–46.0)
Hemoglobin: 12.9 g/dL (ref 12.0–15.0)
Immature Granulocytes: 0 %
Lymphocytes Relative: 33 %
Lymphs Abs: 1.5 10*3/uL (ref 0.7–4.0)
MCH: 26.1 pg (ref 26.0–34.0)
MCHC: 31.8 g/dL (ref 30.0–36.0)
MCV: 82 fL (ref 80.0–100.0)
Monocytes Absolute: 0.4 10*3/uL (ref 0.1–1.0)
Monocytes Relative: 9 %
Neutro Abs: 2.4 10*3/uL (ref 1.7–7.7)
Neutrophils Relative %: 52 %
Platelet Count: 277 10*3/uL (ref 150–400)
RBC: 4.95 MIL/uL (ref 3.87–5.11)
RDW: 15.6 % — ABNORMAL HIGH (ref 11.5–15.5)
WBC Count: 4.5 10*3/uL (ref 4.0–10.5)
nRBC: 0 % (ref 0.0–0.2)

## 2021-07-17 LAB — IRON AND IRON BINDING CAPACITY (CC-WL,HP ONLY)
Iron: 70 ug/dL (ref 28–170)
Saturation Ratios: 15 % (ref 10.4–31.8)
TIBC: 461 ug/dL — ABNORMAL HIGH (ref 250–450)
UIBC: 391 ug/dL (ref 148–442)

## 2021-07-17 LAB — RETICULOCYTES
Immature Retic Fract: 8.5 % (ref 2.3–15.9)
RBC.: 4.81 MIL/uL (ref 3.87–5.11)
Retic Count, Absolute: 52.9 10*3/uL (ref 19.0–186.0)
Retic Ct Pct: 1.1 % (ref 0.4–3.1)

## 2021-07-20 ENCOUNTER — Ambulatory Visit: Payer: 59 | Admitting: Family

## 2021-07-20 LAB — FERRITIN: Ferritin: 31 ng/mL (ref 11–307)

## 2021-09-07 ENCOUNTER — Ambulatory Visit: Payer: 59 | Admitting: Internal Medicine

## 2021-09-30 ENCOUNTER — Ambulatory Visit (INDEPENDENT_AMBULATORY_CARE_PROVIDER_SITE_OTHER): Payer: 59 | Admitting: Internal Medicine

## 2021-09-30 ENCOUNTER — Encounter: Payer: Self-pay | Admitting: Internal Medicine

## 2021-09-30 ENCOUNTER — Encounter: Payer: Self-pay | Admitting: Family

## 2021-09-30 DIAGNOSIS — G609 Hereditary and idiopathic neuropathy, unspecified: Secondary | ICD-10-CM

## 2021-09-30 DIAGNOSIS — L2389 Allergic contact dermatitis due to other agents: Secondary | ICD-10-CM

## 2021-09-30 DIAGNOSIS — G4700 Insomnia, unspecified: Secondary | ICD-10-CM | POA: Diagnosis not present

## 2021-09-30 DIAGNOSIS — G629 Polyneuropathy, unspecified: Secondary | ICD-10-CM | POA: Insufficient documentation

## 2021-09-30 DIAGNOSIS — D500 Iron deficiency anemia secondary to blood loss (chronic): Secondary | ICD-10-CM

## 2021-09-30 DIAGNOSIS — L259 Unspecified contact dermatitis, unspecified cause: Secondary | ICD-10-CM | POA: Insufficient documentation

## 2021-09-30 DIAGNOSIS — E538 Deficiency of other specified B group vitamins: Secondary | ICD-10-CM

## 2021-09-30 MED ORDER — TRAMADOL HCL 50 MG PO TABS: 25.0000 mg | ORAL_TABLET | Freq: Four times a day (QID) | ORAL | 5 refills | Status: DC | PRN

## 2021-09-30 MED ORDER — TRIAMCINOLONE ACETONIDE 0.5 % EX CREA: 1.0000 "application " | TOPICAL_CREAM | Freq: Four times a day (QID) | CUTANEOUS | 2 refills | Status: DC

## 2021-09-30 MED ORDER — PHENTERMINE HCL 37.5 MG PO TABS: 37.5000 mg | ORAL_TABLET | Freq: Every day | ORAL | 2 refills | Status: DC

## 2021-09-30 MED ORDER — ZOLPIDEM TARTRATE 5 MG PO TABS
ORAL_TABLET | ORAL | 3 refills | Status: DC
Start: 2021-09-30 — End: 2021-12-28

## 2021-09-30 MED ORDER — ESOMEPRAZOLE MAGNESIUM 40 MG PO CPDR: 40.0000 mg | DELAYED_RELEASE_CAPSULE | Freq: Every day | ORAL | 3 refills | Status: DC

## 2021-09-30 MED ORDER — GABAPENTIN 100 MG PO CAPS
ORAL_CAPSULE | ORAL | 5 refills | Status: DC
Start: 2021-09-30 — End: 2021-12-28

## 2021-09-30 MED ORDER — GABAPENTIN 100 MG PO CAPS: ORAL_CAPSULE | ORAL | 5 refills | Status: DC

## 2021-09-30 MED ORDER — METHYLPREDNISOLONE 4 MG PO TBPK: ORAL_TABLET | ORAL | 0 refills | Status: DC

## 2021-09-30 NOTE — Progress Notes (Signed)
Subjective:  Patient ID: Amanda [name], female    DOB: [birth year]  Age: 56 y.o. MRN: [id number]  CC: No chief complaint on file.   HPI Amanda [name] presents for severe pains in distal legs/feet and forearms/hands burning pains  Outpatient Medications Prior to Visit  Medication Sig Dispense Refill   b complex vitamins tablet Take 1 tablet by mouth daily. 100 tablet 3   cetirizine (ZYRTEC) 10 MG tablet Take 10 mg by mouth daily.     Cholecalciferol (VITAMIN D3) 2000 units capsule Take 1 capsule (2,000 Units total) by mouth daily. 100 capsule 3   Cyanocobalamin (VITAMIN B-12) 1000 MCG SUBL Place 2 tablets (2,000 mcg total) under the tongue daily. 100 tablet 3   Vitamin D, Ergocalciferol, (DRISDOL) 1.25 MG (50000 UNIT) CAPS capsule Take 1 capsule (50,000 Units total) by mouth every 7 (seven) days. 8 capsule 0   esomeprazole (NEXIUM) 40 MG capsule Take 1 capsule (40 mg total) by mouth daily. 90 capsule 3   traMADol (ULTRAM) 50 MG tablet Take 0.5-1 tablets (25-50 mg total) by mouth every 6 (six) hours as needed for severe pain. 120 tablet 5   zolpidem (AMBIEN) 5 MG tablet TAKE 1/2 TO 1 (ONE-HALF TO ONE) TABLET BY MOUTH AT BEDTIME AS NEEDED FOR SLEEP 30 tablet 3   phentermine (ADIPEX-P) 37.5 MG tablet Take 1 tablet (37.5 mg total) by mouth daily before breakfast. 30 tablet 2   No facility-administered medications prior to visit.    ROS: Review of Systems  Objective:  BP 120/82 (BP Location: Left Arm, Patient Position: Sitting, Cuff Size: Normal)   Pulse 88   Temp 98.7 F (37.1 C) (Oral)   Ht 5\' 2"  (1.575 m)   Wt 222 lb (100.7 kg)   SpO2 99%   BMI 40.60 kg/m   BP Readings from Last 3 Encounters:  09/30/21 120/82  06/09/21 138/82  04/29/21 (!) 142/88    Wt Readings from Last 3 Encounters:  09/30/21 222 lb (100.7 kg)  06/09/21 227 lb 12.8 oz (103.3 kg)  04/29/21 228 lb (103.4 kg)    Physical Exam  Lab Results  Component Value Date   WBC 4.5 07/17/2021   HGB 12.9  07/17/2021   HCT 40.6 07/17/2021   PLT 277 07/17/2021   GLUCOSE 98 04/29/2021   ALT 20 04/29/2021   AST 18 04/29/2021   NA 139 04/29/2021   K 4.1 04/29/2021   CL 103 04/29/2021   CREATININE 0.87 04/29/2021   BUN 10 04/29/2021   CO2 26 04/29/2021   TSH 3.46 04/29/2021   INR 1.03 10/14/2017    No results found.  Assessment & Plan:   Problem List Items Addressed This Visit     ANEMIA-IRON DEFICIENCY    Monitor CBC       B12 deficiency    On B12       Contact dermatitis    Due to Poison oak Triamc prn Medrol pac       Insomnia    Use Tramadol prn for pain/RLS Rare Zolpidem prn  Potential benefits of a long term benzodiazepines  use as well as potential risks  and complications were explained to the patient and were aknowledged.       Peripheral neuropathy    Recurrent B gloves and socks distribution neuropathic pains in the evening ?etiology Start Gabapentin 100-200 mg at 4-5 pm       Relevant Medications   gabapentin (NEURONTIN) 100 MG capsule   zolpidem (AMBIEN) 5 MG  tablet   phentermine (ADIPEX-P) 37.5 MG tablet      Meds ordered this encounter  Medications   DISCONTD: triamcinolone cream (KENALOG) 0.5 %    Sig: Apply 1 application. topically 4 (four) times daily.    Dispense:  90 g    Refill:  2   DISCONTD: gabapentin (NEURONTIN) 100 MG capsule    Sig: Take in the evening    Dispense:  60 capsule    Refill:  5   methylPREDNISolone (MEDROL DOSEPAK) 4 MG TBPK tablet    Sig: As directed    Dispense:  21 tablet    Refill:  0   esomeprazole (NEXIUM) 40 MG capsule    Sig: Take 1 capsule (40 mg total) by mouth daily.    Dispense:  90 capsule    Refill:  3   gabapentin (NEURONTIN) 100 MG capsule    Sig: Take in the evening    Dispense:  60 capsule    Refill:  5   traMADol (ULTRAM) 50 MG tablet    Sig: Take 0.5-1 tablets (25-50 mg total) by mouth every 6 (six) hours as needed for severe pain.    Dispense:  120 tablet    Refill:  5    Please  schedule a follow-up office visit   triamcinolone cream (KENALOG) 0.5 %    Sig: Apply 1 application. topically 4 (four) times daily.    Dispense:  90 g    Refill:  2   zolpidem (AMBIEN) 5 MG tablet    Sig: TAKE 1/2 TO 1 (ONE-HALF TO ONE) TABLET BY MOUTH AT BEDTIME AS NEEDED FOR SLEEP    Dispense:  30 tablet    Refill:  3   phentermine (ADIPEX-P) 37.5 MG tablet    Sig: Take 1 tablet (37.5 mg total) by mouth daily before breakfast.    Dispense:  30 tablet    Refill:  2      Follow-up: Return in about 3 months (around 12/31/2021) for a follow-up visit.  Sonda Primes, MD

## 2021-09-30 NOTE — Assessment & Plan Note (Signed)
On B12 

## 2021-09-30 NOTE — Patient Instructions (Signed)
Blue-Emu cream -- use 2-3 times a day ? ?

## 2021-09-30 NOTE — Assessment & Plan Note (Signed)
Recurrent B gloves and socks distribution neuropathic pains in the evening ?etiology Start Gabapentin 100-200 mg at 4-5 pm

## 2021-09-30 NOTE — Assessment & Plan Note (Signed)
Due to Poison oak Triamc prn Medrol pac

## 2021-09-30 NOTE — Assessment & Plan Note (Signed)
°

## 2021-09-30 NOTE — Assessment & Plan Note (Signed)
Monitor CBC 

## 2021-10-26 ENCOUNTER — Other Ambulatory Visit: Payer: Self-pay

## 2021-10-26 MED ORDER — PHENTERMINE HCL 37.5 MG PO TABS: 37.5000 mg | ORAL_TABLET | Freq: Every day | ORAL | 2 refills | Status: DC

## 2021-10-31 ENCOUNTER — Other Ambulatory Visit: Payer: Self-pay | Admitting: Internal Medicine

## 2021-12-28 ENCOUNTER — Encounter: Payer: Self-pay | Admitting: Family

## 2021-12-28 ENCOUNTER — Ambulatory Visit (INDEPENDENT_AMBULATORY_CARE_PROVIDER_SITE_OTHER): Payer: 59 | Admitting: Internal Medicine

## 2021-12-28 ENCOUNTER — Encounter: Payer: Self-pay | Admitting: Internal Medicine

## 2021-12-28 DIAGNOSIS — F411 Generalized anxiety disorder: Secondary | ICD-10-CM

## 2021-12-28 DIAGNOSIS — G609 Hereditary and idiopathic neuropathy, unspecified: Secondary | ICD-10-CM

## 2021-12-28 DIAGNOSIS — M797 Fibromyalgia: Secondary | ICD-10-CM

## 2021-12-28 DIAGNOSIS — E538 Deficiency of other specified B group vitamins: Secondary | ICD-10-CM

## 2021-12-28 DIAGNOSIS — E559 Vitamin D deficiency, unspecified: Secondary | ICD-10-CM

## 2021-12-28 DIAGNOSIS — R69 Illness, unspecified: Secondary | ICD-10-CM | POA: Diagnosis not present

## 2021-12-28 DIAGNOSIS — D500 Iron deficiency anemia secondary to blood loss (chronic): Secondary | ICD-10-CM

## 2021-12-28 LAB — COMPREHENSIVE METABOLIC PANEL
ALT: 30 U/L (ref 0–35)
AST: 23 U/L (ref 0–37)
Albumin: 4.3 g/dL (ref 3.5–5.2)
Alkaline Phosphatase: 62 U/L (ref 39–117)
BUN: 13 mg/dL (ref 6–23)
CO2: 27 mEq/L (ref 19–32)
Calcium: 9.6 mg/dL (ref 8.4–10.5)
Chloride: 101 mEq/L (ref 96–112)
Creatinine, Ser: 0.9 mg/dL (ref 0.40–1.20)
GFR: 72.42 mL/min (ref >60.00)
Glucose, Bld: 112 mg/dL — ABNORMAL HIGH (ref 70–99)
Potassium: 4.3 mEq/L (ref 3.5–5.1)
Sodium: 137 mEq/L (ref 135–145)
Total Bilirubin: 0.3 mg/dL (ref 0.2–1.2)
Total Protein: 7.8 g/dL (ref 6.0–8.3)

## 2021-12-28 LAB — CBC WITH DIFFERENTIAL/PLATELET
Basophils Absolute: 0.1 10*3/uL (ref 0.0–0.1)
Basophils Relative: 1.5 % (ref 0.0–3.0)
Eosinophils Absolute: 0.3 10*3/uL (ref 0.0–0.7)
Eosinophils Relative: 6 % — ABNORMAL HIGH (ref 0.0–5.0)
HCT: 40.7 % (ref 36.0–46.0)
Hemoglobin: 13.4 g/dL (ref 12.0–15.0)
Lymphocytes Relative: 30.5 % (ref 12.0–46.0)
Lymphs Abs: 1.5 10*3/uL (ref 0.7–4.0)
MCHC: 32.9 g/dL (ref 30.0–36.0)
MCV: 80.4 fl (ref 78.0–100.0)
Monocytes Absolute: 0.4 10*3/uL (ref 0.1–1.0)
Monocytes Relative: 8.5 % (ref 3.0–12.0)
Neutro Abs: 2.6 10*3/uL (ref 1.4–7.7)
Neutrophils Relative %: 53.5 % (ref 43.0–77.0)
Platelets: 270 10*3/uL (ref 150.0–400.0)
RBC: 5.06 Mil/uL (ref 3.87–5.11)
RDW: 15 % (ref 11.5–15.5)
WBC: 4.9 10*3/uL (ref 4.0–10.5)

## 2021-12-28 MED ORDER — ESOMEPRAZOLE MAGNESIUM 40 MG PO CPDR: 40.0000 mg | DELAYED_RELEASE_CAPSULE | Freq: Every day | ORAL | 3 refills | Status: DC

## 2021-12-28 MED ORDER — TRAMADOL HCL 50 MG PO TABS: 25.0000 mg | ORAL_TABLET | Freq: Four times a day (QID) | ORAL | 3 refills | Status: DC | PRN

## 2021-12-28 MED ORDER — ZOLPIDEM TARTRATE 5 MG PO TABS: ORAL_TABLET | ORAL | 3 refills | Status: DC

## 2021-12-28 MED ORDER — TRIAMCINOLONE ACETONIDE 0.5 % EX CREA
1.0000 | TOPICAL_CREAM | Freq: Four times a day (QID) | CUTANEOUS | 3 refills | Status: DC
Start: 2021-12-28 — End: 2022-07-19

## 2021-12-28 MED ORDER — PHENTERMINE HCL 37.5 MG PO TABS: 37.5000 mg | ORAL_TABLET | Freq: Every day | ORAL | 2 refills | Status: DC

## 2021-12-28 NOTE — Assessment & Plan Note (Signed)
On Vit D 

## 2021-12-28 NOTE — Patient Instructions (Signed)
Dulcolax 2 tablets and Miralax 2-3 scoops for constipation

## 2021-12-28 NOTE — Assessment & Plan Note (Signed)
Cymbalta, Gabapentin - not taking

## 2021-12-28 NOTE — Progress Notes (Signed)
Subjective:  Patient ID: Amanda [name], female    DOB: [birth year]  Age: 56 y.o. MRN: [id number]  CC: Follow-up (3 month f/u) and Medication Refill (No longer using Public not sure who takes insurance. Would like printed rx)   HPI Farra Nikolic presents for anemia, insomnia, RLS, FMS Pt declined Gabapentin  Outpatient Medications Prior to Visit  Medication Sig Dispense Refill   b complex vitamins tablet Take 1 tablet by mouth daily. 100 tablet 3   cetirizine (ZYRTEC) 10 MG tablet Take 10 mg by mouth daily.     Cholecalciferol (VITAMIN D3) 2000 units capsule Take 1 capsule (2,000 Units total) by mouth daily. 100 capsule 3   Cyanocobalamin (VITAMIN B-12) 1000 MCG SUBL Place 2 tablets (2,000 mcg total) under the tongue daily. 100 tablet 3   esomeprazole (NEXIUM) 40 MG capsule Take 1 capsule (40 mg total) by mouth daily. 90 capsule 3   phentermine (ADIPEX-P) 37.5 MG tablet Take 1 tablet (37.5 mg total) by mouth daily before breakfast. 30 tablet 2   traMADol (ULTRAM) 50 MG tablet Take 0.5-1 tablets (25-50 mg total) by mouth every 6 (six) hours as needed for severe pain. 120 tablet 5   triamcinolone cream (KENALOG) 0.5 % Apply 1 application. topically 4 (four) times daily. 90 g 2   zolpidem (AMBIEN) 5 MG tablet TAKE 1/2 TO 1 (ONE-HALF TO ONE) TABLET BY MOUTH AT BEDTIME AS NEEDED FOR SLEEP 30 tablet 3   gabapentin (NEURONTIN) 100 MG capsule Take in the evening (Patient not taking: Reported on 12/28/2021) 60 capsule 5   methylPREDNISolone (MEDROL DOSEPAK) 4 MG TBPK tablet As directed (Patient not taking: Reported on 12/28/2021) 21 tablet 0   Vitamin D, Ergocalciferol, (DRISDOL) 1.25 MG (50000 UNIT) CAPS capsule Take 1 capsule (50,000 Units total) by mouth every 7 (seven) days. (Patient not taking: Reported on 12/28/2021) 8 capsule 0   No facility-administered medications prior to visit.    ROS: Review of Systems  Constitutional:  Positive for fatigue. Negative for activity change, appetite change,  chills and unexpected weight change.  HENT:  Negative for congestion, mouth sores and sinus pressure.   Eyes:  Negative for visual disturbance.  Respiratory:  Negative for cough and chest tightness.   Gastrointestinal:  Negative for abdominal pain and nausea.  Genitourinary:  Negative for difficulty urinating, frequency and vaginal pain.  Musculoskeletal:  Positive for arthralgias and myalgias. Negative for back pain and gait problem.  Skin:  Negative for pallor and rash.  Neurological:  Negative for dizziness, tremors, weakness, numbness and headaches.  Psychiatric/Behavioral:  Positive for sleep disturbance. Negative for confusion and suicidal ideas. The patient is nervous/anxious.     Objective:  BP 128/72 (BP Location: Left Arm)   Pulse 91   Temp 97.7 F (36.5 C) (Oral)   Ht 5\' 2"  (1.575 m)   Wt 221 lb 6.4 oz (100.4 kg)   SpO2 99%   BMI 40.49 kg/m   BP Readings from Last 3 Encounters:  12/28/21 128/72  09/30/21 120/82  06/09/21 138/82    Wt Readings from Last 3 Encounters:  12/28/21 221 lb 6.4 oz (100.4 kg)  09/30/21 222 lb (100.7 kg)  06/09/21 227 lb 12.8 oz (103.3 kg)    Physical Exam Constitutional:      General: She is not in acute distress.    Appearance: She is well-developed. She is obese.  HENT:     Head: Normocephalic.     Right Ear: External ear normal.  Left Ear: External ear normal.     Nose: Nose normal.  Eyes:     General:        Right eye: No discharge.        Left eye: No discharge.     Conjunctiva/sclera: Conjunctivae normal.     Pupils: Pupils are equal, round, and reactive to light.  Neck:     Thyroid: No thyromegaly.     Vascular: No JVD.     Trachea: No tracheal deviation.  Cardiovascular:     Rate and Rhythm: Normal rate and regular rhythm.     Heart sounds: Normal heart sounds.  Pulmonary:     Effort: No respiratory distress.     Breath sounds: No stridor. No wheezing.  Abdominal:     General: Bowel sounds are normal. There is  no distension.     Palpations: Abdomen is soft. There is no mass.     Tenderness: There is no abdominal tenderness. There is no guarding or rebound.  Musculoskeletal:        General: No tenderness.     Cervical back: Normal range of motion and neck supple. No rigidity.  Lymphadenopathy:     Cervical: No cervical adenopathy.  Skin:    Findings: No erythema or rash.  Neurological:     Mental Status: She is oriented to person, place, and time.     Cranial Nerves: No cranial nerve deficit.     Motor: No abnormal muscle tone.     Coordination: Coordination normal.     Deep Tendon Reflexes: Reflexes normal.  Psychiatric:        Behavior: Behavior normal.        Thought Content: Thought content normal.        Judgment: Judgment normal.     Lab Results  Component Value Date   WBC 4.5 07/17/2021   HGB 12.9 07/17/2021   HCT 40.6 07/17/2021   PLT 277 07/17/2021   GLUCOSE 98 04/29/2021   ALT 20 04/29/2021   AST 18 04/29/2021   NA 139 04/29/2021   K 4.1 04/29/2021   CL 103 04/29/2021   CREATININE 0.87 04/29/2021   BUN 10 04/29/2021   CO2 26 04/29/2021   TSH 3.46 04/29/2021   INR 1.03 10/14/2017    No results found.  Assessment & Plan:   Problem List Items Addressed This Visit     ANEMIA-IRON DEFICIENCY    Check labs      Relevant Orders   CBC with Differential/Platelet   Comprehensive metabolic panel   Iron, TIBC and Ferritin Panel   Anxiety state    Not taking Rx      B12 deficiency    On B12On       Fibromyalgia syndrome    Cymbalta, Gabapentin - not taking  Use Tramadol more often.  Potential benefits of a long term opioids use as well as potential risks (i.e. addiction risk, apnea etc) and complications (i.e. Somnolence, constipation and others) were explained to the patient and were aknowledged.      Relevant Medications   traMADol (ULTRAM) 50 MG tablet   Peripheral neuropathy    Cymbalta, Gabapentin - not taking      Relevant Medications    phentermine (ADIPEX-P) 37.5 MG tablet   zolpidem (AMBIEN) 5 MG tablet   Other Relevant Orders   CBC with Differential/Platelet   Vitamin D deficiency    On Vit D         Meds ordered this encounter  Medications   esomeprazole (NEXIUM) 40 MG capsule    Sig: Take 1 capsule (40 mg total) by mouth daily.    Dispense:  90 capsule    Refill:  3   phentermine (ADIPEX-P) 37.5 MG tablet    Sig: Take 1 tablet (37.5 mg total) by mouth daily before breakfast.    Dispense:  30 tablet    Refill:  2   traMADol (ULTRAM) 50 MG tablet    Sig: Take 0.5-1 tablets (25-50 mg total) by mouth every 6 (six) hours as needed for severe pain.    Dispense:  120 tablet    Refill:  3   triamcinolone cream (KENALOG) 0.5 %    Sig: Apply 1 Application topically 4 (four) times daily.    Dispense:  90 g    Refill:  3   zolpidem (AMBIEN) 5 MG tablet    Sig: TAKE 1/2 TO 1 (ONE-HALF TO ONE) TABLET BY MOUTH AT BEDTIME AS NEEDED FOR SLEEP    Dispense:  30 tablet    Refill:  3      Follow-up: Return in about 3 months (around 03/30/2022) for a follow-up visit.  Sonda Primes, MD

## 2021-12-28 NOTE — Assessment & Plan Note (Signed)
On B12On

## 2021-12-28 NOTE — Assessment & Plan Note (Addendum)
Cymbalta, Gabapentin - not taking  Use Tramadol more often.  Potential benefits of a long term opioids use as well as potential risks (i.e. addiction risk, apnea etc) and complications (i.e. Somnolence, constipation and others) were explained to the patient and were aknowledged.

## 2021-12-28 NOTE — Assessment & Plan Note (Signed)
Check labs 

## 2021-12-28 NOTE — Assessment & Plan Note (Signed)
Not taking Rx 

## 2021-12-29 LAB — IRON,TIBC AND FERRITIN PANEL
%SAT: 17 % (calc) (ref 16–45)
Ferritin: 17 ng/mL (ref 16–232)
Iron: 71 ug/dL (ref 45–160)
TIBC: 421 mcg/dL (calc) (ref 250–450)

## 2022-03-01 ENCOUNTER — Encounter: Payer: Self-pay | Admitting: Family

## 2022-03-02 ENCOUNTER — Encounter: Payer: Self-pay | Admitting: Family

## 2022-03-28 ENCOUNTER — Other Ambulatory Visit: Payer: Self-pay | Admitting: Internal Medicine

## 2022-05-28 ENCOUNTER — Encounter: Payer: Self-pay | Admitting: Family

## 2022-05-31 ENCOUNTER — Encounter: Payer: Self-pay | Admitting: Internal Medicine

## 2022-05-31 ENCOUNTER — Ambulatory Visit (INDEPENDENT_AMBULATORY_CARE_PROVIDER_SITE_OTHER): Payer: Self-pay | Admitting: Internal Medicine

## 2022-05-31 VITALS — BP 126/80 | HR 85 | Temp 98.7°F | Ht 62.0 in | Wt 218.0 lb

## 2022-05-31 DIAGNOSIS — U071 COVID-19: Secondary | ICD-10-CM

## 2022-05-31 DIAGNOSIS — M797 Fibromyalgia: Secondary | ICD-10-CM

## 2022-05-31 DIAGNOSIS — B001 Herpesviral vesicular dermatitis: Secondary | ICD-10-CM | POA: Insufficient documentation

## 2022-05-31 DIAGNOSIS — D500 Iron deficiency anemia secondary to blood loss (chronic): Secondary | ICD-10-CM

## 2022-05-31 DIAGNOSIS — E538 Deficiency of other specified B group vitamins: Secondary | ICD-10-CM

## 2022-05-31 DIAGNOSIS — F418 Other specified anxiety disorders: Secondary | ICD-10-CM

## 2022-05-31 DIAGNOSIS — Z0000 Encounter for general adult medical examination without abnormal findings: Secondary | ICD-10-CM

## 2022-05-31 DIAGNOSIS — K2100 Gastro-esophageal reflux disease with esophagitis, without bleeding: Secondary | ICD-10-CM

## 2022-05-31 LAB — CBC WITH DIFFERENTIAL/PLATELET
Basophils Absolute: 0.1 10*3/uL (ref 0.0–0.1)
Basophils Relative: 1.4 % (ref 0.0–3.0)
Eosinophils Absolute: 0.4 10*3/uL (ref 0.0–0.7)
Eosinophils Relative: 6.7 % — ABNORMAL HIGH (ref 0.0–5.0)
HCT: 41.1 % (ref 36.0–46.0)
Hemoglobin: 13.7 g/dL (ref 12.0–15.0)
Lymphocytes Relative: 26.6 % (ref 12.0–46.0)
Lymphs Abs: 1.6 10*3/uL (ref 0.7–4.0)
MCHC: 33.4 g/dL (ref 30.0–36.0)
MCV: 78.5 fl (ref 78.0–100.0)
Monocytes Absolute: 0.5 10*3/uL (ref 0.1–1.0)
Monocytes Relative: 8.2 % (ref 3.0–12.0)
Neutro Abs: 3.4 10*3/uL (ref 1.4–7.7)
Neutrophils Relative %: 57.1 % (ref 43.0–77.0)
Platelets: 287 10*3/uL (ref 150.0–400.0)
RBC: 5.23 Mil/uL — ABNORMAL HIGH (ref 3.87–5.11)
RDW: 15.4 % (ref 11.5–15.5)
WBC: 5.9 10*3/uL (ref 4.0–10.5)

## 2022-05-31 LAB — COMPREHENSIVE METABOLIC PANEL
ALT: 16 U/L (ref 0–35)
AST: 15 U/L (ref 0–37)
Albumin: 4.5 g/dL (ref 3.5–5.2)
Alkaline Phosphatase: 66 U/L (ref 39–117)
BUN: 11 mg/dL (ref 6–23)
CO2: 26 mEq/L (ref 19–32)
Calcium: 9.7 mg/dL (ref 8.4–10.5)
Chloride: 103 mEq/L (ref 96–112)
Creatinine, Ser: 0.92 mg/dL (ref 0.40–1.20)
GFR: 70.32 mL/min (ref >60.00)
Glucose, Bld: 104 mg/dL — ABNORMAL HIGH (ref 70–99)
Potassium: 4.3 mEq/L (ref 3.5–5.1)
Sodium: 139 mEq/L (ref 135–145)
Total Bilirubin: 0.2 mg/dL (ref 0.2–1.2)
Total Protein: 8.2 g/dL (ref 6.0–8.3)

## 2022-05-31 MED ORDER — DOXYCYCLINE HYCLATE 100 MG PO TABS: 100.0000 mg | ORAL_TABLET | Freq: Two times a day (BID) | ORAL | 0 refills | Status: DC

## 2022-05-31 MED ORDER — TRAMADOL HCL 50 MG PO TABS: 25.0000 mg | ORAL_TABLET | Freq: Four times a day (QID) | ORAL | 3 refills | Status: DC | PRN

## 2022-05-31 MED ORDER — MOLNUPIRAVIR EUA 200MG CAPSULE: 4.0000 | ORAL_CAPSULE | Freq: Two times a day (BID) | ORAL | 0 refills | Status: DC

## 2022-05-31 MED ORDER — ESOMEPRAZOLE MAGNESIUM 40 MG PO CPDR: 40.0000 mg | DELAYED_RELEASE_CAPSULE | Freq: Every day | ORAL | 3 refills | Status: DC

## 2022-05-31 MED ORDER — PHENTERMINE HCL 37.5 MG PO TABS: ORAL_TABLET | ORAL | 2 refills | Status: DC

## 2022-05-31 MED ORDER — VALACYCLOVIR HCL 500 MG PO TABS: ORAL_TABLET | ORAL | 1 refills | Status: DC

## 2022-05-31 MED ORDER — ZOLPIDEM TARTRATE 5 MG PO TABS: ORAL_TABLET | ORAL | 3 refills | Status: DC

## 2022-05-31 NOTE — Assessment & Plan Note (Signed)
Recurrent on nose - L nare Valtrex prn Doxy po if worse

## 2022-05-31 NOTE — Assessment & Plan Note (Signed)
Not taking Rx

## 2022-05-31 NOTE — Assessment & Plan Note (Signed)
Cont w/B12 SL

## 2022-05-31 NOTE — Assessment & Plan Note (Signed)
On nexium

## 2022-05-31 NOTE — Progress Notes (Addendum)
Subjective:  Patient ID: Amanda [name], female    DOB: 1966/04/18  Age: 57 y.o. MRN: [id number]  CC: Rash (Been on her face since Thursday , has similar rash last year . Not itchy but feel likes stinging sometimes and had clear liquid come out when squeezing , would like to get bloodwork for iron and hemoglobin levels)   HPI Amanda [name] presents for rash on L nose  F/u anemia, GERD, FMS    Outpatient Medications Prior to Visit  Medication Sig Dispense Refill   b complex vitamins tablet Take 1 tablet by mouth daily. 100 tablet 3   cetirizine (ZYRTEC) 10 MG tablet Take 10 mg by mouth daily.     Cholecalciferol (VITAMIN D3) 2000 units capsule Take 1 capsule (2,000 Units total) by mouth daily. 100 capsule 3   Cyanocobalamin (VITAMIN B-12) 1000 MCG SUBL Place 2 tablets (2,000 mcg total) under the tongue daily. 100 tablet 3   triamcinolone cream (KENALOG) 0.5 % Apply 1 Application topically 4 (four) times daily. 90 g 3   esomeprazole (NEXIUM) 40 MG capsule Take 1 capsule (40 mg total) by mouth daily. 90 capsule 3   phentermine (ADIPEX-P) 37.5 MG tablet TAKE ONE TABLET BY MOUTH DAILY BEFORE BREAKFAST 30 tablet 2   traMADol (ULTRAM) 50 MG tablet Take 0.5-1 tablets (25-50 mg total) by mouth every 6 (six) hours as needed for severe pain. 120 tablet 3   zolpidem (AMBIEN) 5 MG tablet TAKE 1/2 TO 1 (ONE-HALF TO ONE) TABLET BY MOUTH AT BEDTIME AS NEEDED FOR SLEEP 30 tablet 3   No facility-administered medications prior to visit.    ROS: Review of Systems  Constitutional:  Positive for fatigue. Negative for activity change, appetite change, chills and unexpected weight change.  HENT:  Negative for congestion, mouth sores and sinus pressure.   Eyes:  Negative for visual disturbance.  Respiratory:  Negative for cough and chest tightness.   Gastrointestinal:  Negative for abdominal pain and nausea.  Genitourinary:  Negative for difficulty urinating, frequency and vaginal pain.  Musculoskeletal:   Negative for back pain and gait problem.  Skin:  Positive for color change and rash. Negative for pallor.  Neurological:  Negative for dizziness, tremors, weakness, numbness and headaches.  Psychiatric/Behavioral:  Negative for confusion and sleep disturbance.     Objective:  BP 126/80 (BP Location: Right Arm, Patient Position: Sitting, Cuff Size: Normal)   Pulse 85   Temp 98.7 F (37.1 C) (Oral)   Ht 5\' 2"  (1.575 m)   Wt 218 lb (98.9 kg)   SpO2 99%   BMI 39.87 kg/m   BP Readings from Last 3 Encounters:  05/31/22 126/80  12/28/21 128/72  09/30/21 120/82    Wt Readings from Last 3 Encounters:  05/31/22 218 lb (98.9 kg)  12/28/21 221 lb 6.4 oz (100.4 kg)  09/30/21 222 lb (100.7 kg)    Physical Exam Constitutional:      General: She is not in acute distress.    Appearance: She is well-developed. She is obese.  HENT:     Head: Normocephalic.     Right Ear: External ear normal.     Left Ear: External ear normal.     Nose: Nose normal.  Eyes:     General:        Right eye: No discharge.        Left eye: No discharge.     Conjunctiva/sclera: Conjunctivae normal.     Pupils: Pupils are equal, round, and reactive  to light.  Neck:     Thyroid: No thyromegaly.     Vascular: No JVD.     Trachea: No tracheal deviation.  Cardiovascular:     Rate and Rhythm: Normal rate and regular rhythm.     Heart sounds: Normal heart sounds.  Pulmonary:     Effort: No respiratory distress.     Breath sounds: No stridor. No wheezing.  Abdominal:     General: Bowel sounds are normal. There is no distension.     Palpations: Abdomen is soft. There is no mass.     Tenderness: There is no abdominal tenderness. There is no guarding or rebound.  Musculoskeletal:        General: No tenderness.     Cervical back: Normal range of motion and neck supple. No rigidity.  Lymphadenopathy:     Cervical: No cervical adenopathy.  Skin:    Findings: No erythema or rash.  Neurological:     Cranial  Nerves: No cranial nerve deficit.     Motor: No abnormal muscle tone.     Coordination: Coordination normal.     Deep Tendon Reflexes: Reflexes normal.  Psychiatric:        Behavior: Behavior normal.        Thought Content: Thought content normal.        Judgment: Judgment normal.     Lab Results  Component Value Date   WBC 4.9 12/28/2021   HGB 13.4 12/28/2021   HCT 40.7 12/28/2021   PLT 270.0 12/28/2021   GLUCOSE 112 (H) 12/28/2021   ALT 30 12/28/2021   AST 23 12/28/2021   NA 137 12/28/2021   K 4.3 12/28/2021   CL 101 12/28/2021   CREATININE 0.90 12/28/2021   BUN 13 12/28/2021   CO2 27 12/28/2021   TSH 3.46 04/29/2021   INR 1.03 10/14/2017    No results found.  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 Use Tramadol prn.  Potential benefits of a long term opioids use as well as potential risks (i.e. addiction risk, apnea etc) and complications (i.e. Somnolence, constipation and others) were explained to the patient and were aknowledged.      Relevant Medications   traMADol (ULTRAM) 50 MG tablet   Situational anxiety    Not taking Rx      Other Visit Diagnoses     COVID-19       Relevant Medications   valACYclovir (VALTREX) 500 MG tablet   Well adult exam       Relevant Orders   TSH   CBC with Differential/Platelet         Meds ordered this encounter  Medications   valACYclovir (VALTREX) 500 MG tablet    Sig:  Use bid  x 7 days prn cold sores    Dispense:  56 tablet    Refill:  1   doxycycline (VIBRA-TABS) 100 MG tablet    Sig: Take 1 tablet (100 mg total) by mouth 2 (two) times daily. Skin bacterial infection    Dispense:  20 tablet    Refill:  0   DISCONTD: esomeprazole (NEXIUM) 40 MG capsule    Sig: Take 1 capsule (40 mg total) by mouth daily.    Dispense:  90 capsule    Refill:  3   traMADol (ULTRAM) 50 MG tablet    Sig: Take 0.5-1 tablets (25-50 mg total) by mouth every 6 (six) hours as needed for severe pain.    Dispense:  120 tablet    Refill:  3   phentermine (ADIPEX-P) 37.5 MG tablet    Sig: TAKE ONE TABLET BY MOUTH DAILY BEFORE BREAKFAST    Dispense:  30 tablet    Refill:  2   esomeprazole (NEXIUM) 40 MG capsule    Sig: Take 1 capsule (40 mg total) by mouth daily.    Dispense:  90 capsule    Refill:  3   zolpidem (AMBIEN) 5 MG tablet    Sig: TAKE 1/2 TO 1 (ONE-HALF TO ONE) TABLET BY MOUTH AT BEDTIME AS NEEDED FOR SLEEP    Dispense:  30 tablet    Refill:  3   DISCONTD: molnupiravir EUA (LAGEVRIO) 200 mg CAPS capsule    Sig: Take 4 capsules (800 mg total) by mouth 2 (two) times daily for 5 days.    Dispense:  40 capsule    Refill:  0      Follow-up: Return in about 3 months (around 08/30/2022) for a follow-up visit.  Walker Kehr, MD

## 2022-05-31 NOTE — Assessment & Plan Note (Signed)
Check CBC, iron 

## 2022-05-31 NOTE — Assessment & Plan Note (Signed)
New For a mild COVID-19 case - take zinc 50 mg a day for 1 week, vitamin C 1000 mg daily for 1 week, vitamin D2 50,000 units weekly for 2 months (unless  taking vitamin D daily already), an antioxidant Quercetin 500 mg twice a day for 1 week (if you can get it quick enough). Take Allegra or Benadryl.  Maintain good oral hydration and take Tylenol for high fever.  Call if problems. Isolate for 5 days, then wear a mask for 5 days per CDC.  Remdisvir if worse

## 2022-05-31 NOTE — Assessment & Plan Note (Signed)
Working too much. Use Tramadol prn.  Potential benefits of a long term opioids use as well as potential risks (i.e. addiction risk, apnea etc) and complications (i.e. Somnolence, constipation and others) were explained to the patient and were aknowledged.

## 2022-05-31 NOTE — Addendum Note (Signed)
Addended by: Cassandria Anger on: 05/31/2022 02:19 PM   Modules accepted: Level of Service

## 2022-06-01 LAB — IRON,TIBC AND FERRITIN PANEL
%SAT: 9 % (calc) — ABNORMAL LOW (ref 16–45)
Ferritin: 6 ng/mL — ABNORMAL LOW (ref 16–232)
Iron: 41 ug/dL — ABNORMAL LOW (ref 45–160)
TIBC: 461 mcg/dL (calc) — ABNORMAL HIGH (ref 250–450)

## 2022-06-27 ENCOUNTER — Other Ambulatory Visit: Payer: Self-pay | Admitting: Internal Medicine

## 2022-07-19 ENCOUNTER — Other Ambulatory Visit: Payer: Self-pay | Admitting: Internal Medicine

## 2022-08-17 ENCOUNTER — Telehealth: Payer: Self-pay | Admitting: Internal Medicine

## 2022-08-17 NOTE — Telephone Encounter (Signed)
Pt will need ov to discuss. MD advise pt to Follow-up: Return in about 3 months (around 08/30/2022) for a follow-up visit. Pls schedule.Marland KitchenRaechel Chute

## 2022-08-17 NOTE — Telephone Encounter (Signed)
Patient's daughter Amanda [name] called and said her mother needs blood work done to have her iron tested. She needs to have a blood infusion done. They said This has been done before and would like to know if they would need to come in for an appointment or if Dr. Posey Rea can put orders in for the patient to go to the lab. Best callback for Amanda [name] is [phone]. Best callback for patient is [phone].

## 2022-08-30 ENCOUNTER — Encounter: Payer: Self-pay | Admitting: Internal Medicine

## 2022-08-30 ENCOUNTER — Ambulatory Visit (INDEPENDENT_AMBULATORY_CARE_PROVIDER_SITE_OTHER): Payer: Self-pay | Admitting: Internal Medicine

## 2022-08-30 VITALS — BP 122/80 | HR 86 | Temp 98.3°F | Ht 62.0 in | Wt 222.0 lb

## 2022-08-30 DIAGNOSIS — R0609 Other forms of dyspnea: Secondary | ICD-10-CM | POA: Insufficient documentation

## 2022-08-30 DIAGNOSIS — D500 Iron deficiency anemia secondary to blood loss (chronic): Secondary | ICD-10-CM

## 2022-08-30 DIAGNOSIS — Z0000 Encounter for general adult medical examination without abnormal findings: Secondary | ICD-10-CM

## 2022-08-30 DIAGNOSIS — K219 Gastro-esophageal reflux disease without esophagitis: Secondary | ICD-10-CM

## 2022-08-30 LAB — CBC WITH DIFFERENTIAL/PLATELET
Basophils Absolute: 0.1 10*3/uL (ref 0.0–0.1)
Basophils Relative: 2.4 % (ref 0.0–3.0)
Eosinophils Absolute: 0.4 10*3/uL (ref 0.0–0.7)
Eosinophils Relative: 7.4 % — ABNORMAL HIGH (ref 0.0–5.0)
HCT: 34.1 % — ABNORMAL LOW (ref 36.0–46.0)
Hemoglobin: 10.8 g/dL — ABNORMAL LOW (ref 12.0–15.0)
Lymphocytes Relative: 31.8 % (ref 12.0–46.0)
Lymphs Abs: 1.8 10*3/uL (ref 0.7–4.0)
MCHC: 31.7 g/dL (ref 30.0–36.0)
MCV: 72.9 fl — ABNORMAL LOW (ref 78.0–100.0)
Monocytes Absolute: 0.5 10*3/uL (ref 0.1–1.0)
Monocytes Relative: 9.4 % (ref 3.0–12.0)
Neutro Abs: 2.8 10*3/uL (ref 1.4–7.7)
Neutrophils Relative %: 49 % (ref 43.0–77.0)
Platelets: 326 10*3/uL (ref 150.0–400.0)
RBC: 4.68 Mil/uL (ref 3.87–5.11)
RDW: 15.6 % — ABNORMAL HIGH (ref 11.5–15.5)
WBC: 5.7 10*3/uL (ref 4.0–10.5)

## 2022-08-30 LAB — TSH: TSH: 3.22 u[IU]/mL (ref 0.35–5.50)

## 2022-08-30 MED ORDER — PHENTERMINE HCL 37.5 MG PO TABS: ORAL_TABLET | ORAL | 2 refills | Status: DC

## 2022-08-30 MED ORDER — ESOMEPRAZOLE MAGNESIUM 40 MG PO CPDR: 40.0000 mg | DELAYED_RELEASE_CAPSULE | Freq: Every day | ORAL | 3 refills | Status: DC

## 2022-08-30 NOTE — Progress Notes (Signed)
Subjective:  Patient ID: Amanda [name], female    DOB: 1965-09-21  Age: 57 y.o. MRN: [id number]  CC: Labs Only (Requesting Iron levels to be checked )   HPI Amanda [name] presents for DOE x 1 month, when bending over Working 16 h /week F/u anemia, OA  Foam Garden Kneeler Pad  Outpatient Medications Prior to Visit  Medication Sig Dispense Refill   b complex vitamins tablet Take 1 tablet by mouth daily. 100 tablet 3   cetirizine (ZYRTEC) 10 MG tablet Take 10 mg by mouth daily.     Cholecalciferol (VITAMIN D3) 2000 units capsule Take 1 capsule (2,000 Units total) by mouth daily. 100 capsule 3   Cyanocobalamin (VITAMIN B-12) 1000 MCG SUBL Place 2 tablets (2,000 mcg total) under the tongue daily. 100 tablet 3   traMADol (ULTRAM) 50 MG tablet Take 0.5-1 tablets (25-50 mg total) by mouth every 6 (six) hours as needed for severe pain. 120 tablet 3   triamcinolone cream (KENALOG) 0.5 % APPLY TO THE AFFECTED AREA(S) TOPICALLY FOUR TIMES DAILY 90 g 2   valACYclovir (VALTREX) 500 MG tablet Use bid x 7 days prn cold sores 56 tablet 1   zolpidem (AMBIEN) 5 MG tablet TAKE ONE-HALF TO ONE TABLET BY MOUTH AT BEDTIME AS NEEDED FOR SLEEP 30 tablet 3   doxycycline (VIBRA-TABS) 100 MG tablet Take 1 tablet (100 mg total) by mouth 2 (two) times daily. Skin bacterial infection 20 tablet 0   esomeprazole (NEXIUM) 40 MG capsule Take 1 capsule (40 mg total) by mouth daily. 90 capsule 3   phentermine (ADIPEX-P) 37.5 MG tablet TAKE ONE TABLET BY MOUTH DAILY BEFORE BREAKFAST 30 tablet 2   No facility-administered medications prior to visit.    ROS: Review of Systems  Constitutional:  Positive for unexpected weight change. Negative for activity change, appetite change, chills and fatigue.  HENT:  Negative for congestion, mouth sores and sinus pressure.   Eyes:  Negative for visual disturbance.  Respiratory:  Positive for shortness of breath. Negative for cough, chest tightness and wheezing.   Cardiovascular:   Negative for chest pain.  Gastrointestinal:  Negative for abdominal pain and nausea.  Genitourinary:  Negative for difficulty urinating, frequency and vaginal pain.  Musculoskeletal:  Negative for back pain and gait problem.  Skin:  Negative for pallor and rash.  Neurological:  Negative for dizziness, tremors, weakness, numbness and headaches.  Psychiatric/Behavioral:  Negative for confusion and sleep disturbance.     Objective:  BP 122/80 (BP Location: Right Arm, Patient Position: Sitting, Cuff Size: Large)   Pulse 86   Temp 98.3 F (36.8 C) (Oral)   Ht 5\' 2"  (1.575 m)   Wt 222 lb (100.7 kg)   SpO2 99%   BMI 40.60 kg/m   BP Readings from Last 3 Encounters:  08/30/22 122/80  05/31/22 126/80  12/28/21 128/72    Wt Readings from Last 3 Encounters:  08/30/22 222 lb (100.7 kg)  05/31/22 218 lb (98.9 kg)  12/28/21 221 lb 6.4 oz (100.4 kg)    Physical Exam Constitutional:      General: She is not in acute distress.    Appearance: She is well-developed. She is obese.  HENT:     Head: Normocephalic.     Right Ear: External ear normal.     Left Ear: External ear normal.     Nose: Nose normal.  Eyes:     General:        Right eye: No discharge.  Left eye: No discharge.     Conjunctiva/sclera: Conjunctivae normal.     Pupils: Pupils are equal, round, and reactive to light.  Neck:     Thyroid: No thyromegaly.     Vascular: No JVD.     Trachea: No tracheal deviation.  Cardiovascular:     Rate and Rhythm: Normal rate and regular rhythm.     Heart sounds: Normal heart sounds.  Pulmonary:     Effort: No respiratory distress.     Breath sounds: No stridor. No wheezing.  Abdominal:     General: Bowel sounds are normal. There is no distension.     Palpations: Abdomen is soft. There is no mass.     Tenderness: There is no abdominal tenderness. There is no guarding or rebound.  Musculoskeletal:        General: No tenderness.     Cervical back: Normal range of motion  and neck supple. No rigidity.  Lymphadenopathy:     Cervical: No cervical adenopathy.  Skin:    Findings: No erythema or rash.  Neurological:     Cranial Nerves: No cranial nerve deficit.     Motor: No abnormal muscle tone.     Coordination: Coordination normal.     Deep Tendon Reflexes: Reflexes normal.  Psychiatric:        Behavior: Behavior normal.        Thought Content: Thought content normal.        Judgment: Judgment normal.     Lab Results  Component Value Date   WBC 5.9 05/31/2022   HGB 13.7 05/31/2022   HCT 41.1 05/31/2022   PLT 287.0 05/31/2022   GLUCOSE 104 (H) 05/31/2022   ALT 16 05/31/2022   AST 15 05/31/2022   NA 139 05/31/2022   K 4.3 05/31/2022   CL 103 05/31/2022   CREATININE 0.92 05/31/2022   BUN 11 05/31/2022   CO2 26 05/31/2022   TSH 3.46 04/29/2021   INR 1.03 10/14/2017    No results found.  Assessment & Plan:   Problem List Items Addressed This Visit     Anemia, iron deficiency    Check CBC, iron      GERD (gastroesophageal reflux disease) - Primary    Worse .  Re-start Nexium. Worse 12/22.  She stopped using Protonix.  She thought it stopped working.  We will start Nexium.  DOE x 1 month, when bending over likely due to [facility].      Relevant Medications   esomeprazole (NEXIUM) 40 MG capsule   DOE (dyspnea on exertion)     DOE x 1 month, when bending over likely due to [facility]. Check CBC, CMEE         Meds ordered this encounter  Medications   esomeprazole (NEXIUM) 40 MG capsule    Sig: Take 1 capsule (40 mg total) by mouth daily.    Dispense:  90 capsule    Refill:  3   phentermine (ADIPEX-P) 37.5 MG tablet    Sig: TAKE ONE TABLET BY MOUTH DAILY BEFORE BREAKFAST    Dispense:  30 tablet    Refill:  2      Follow-up: Return for a follow-up visit.  Sonda Primes, MD

## 2022-08-30 NOTE — Assessment & Plan Note (Signed)
DOE x 1 month, when bending over likely due to [facility]. Check CBC, CMEE

## 2022-08-30 NOTE — Assessment & Plan Note (Signed)
Worse .  Re-start Nexium. Worse 12/22.  She stopped using Protonix.  She thought it stopped working.  We will start Nexium.  DOE x 1 month, when bending over likely due to [facility].

## 2022-08-30 NOTE — Assessment & Plan Note (Signed)
Check CBC, iron 

## 2022-08-30 NOTE — Patient Instructions (Addendum)
Foam Garden Kneeler Pad   USEFUL THINGS FOR HAND ARTHRITIS  RICE SOCK HEATING PAD: [email or web address]    SILICONE PADS:    BRIX JAR OPENER:    NITRILE COATED GARDEN GLOVES:   THUMB BRACE:     BLUE EMU CREAM:

## 2022-09-07 ENCOUNTER — Encounter: Payer: Self-pay | Admitting: Internal Medicine

## 2022-09-13 ENCOUNTER — Other Ambulatory Visit: Payer: Self-pay | Admitting: Internal Medicine

## 2022-09-13 MED ORDER — ACCRUFER 30 MG PO CAPS: ORAL_CAPSULE | ORAL | 5 refills | Status: AC

## 2022-09-13 MED ORDER — ACCRUFER 30 MG PO CAPS: ORAL_CAPSULE | ORAL | 5 refills | Status: DC

## 2022-09-14 ENCOUNTER — Encounter: Payer: Self-pay | Admitting: Family

## 2022-09-14 ENCOUNTER — Telehealth: Payer: Self-pay | Admitting: *Deleted

## 2022-09-14 NOTE — Telephone Encounter (Signed)
Pt is needing PA on Accrufer 30 mg. Submitted w/  (Key: [id number]). Waiting on insurance determination.Marland KitchenRaechel Chute

## 2022-09-14 NOTE — Telephone Encounter (Signed)
Pt needing PA on esomprazole 40 mg. Submitted w/ [id number]

## 2022-09-15 ENCOUNTER — Other Ambulatory Visit: Payer: Self-pay | Admitting: Family

## 2022-09-15 ENCOUNTER — Other Ambulatory Visit: Payer: Self-pay | Admitting: Internal Medicine

## 2022-09-15 DIAGNOSIS — D500 Iron deficiency anemia secondary to blood loss (chronic): Secondary | ICD-10-CM

## 2022-09-16 NOTE — Telephone Encounter (Signed)
Rec'd determination PA request has been denied. Medical necessity were not met. You must have trial and failure for at least one month or unable to use two of the following: lansoprazole, omeprazole, and pantoprazole...Raechel Chute

## 2022-09-17 NOTE — Telephone Encounter (Signed)
Okay PA for esomeprazole The patient can work out a Advertising account planner for [employer] with [employer] as we discussed with her. Thanks

## 2022-09-20 NOTE — Telephone Encounter (Signed)
The PA for esomeprazole was done it was denied. It states must try and failed lansoprazole, omeprazole, and pantoprazole .Marland KitchenRaechel Chute

## 2022-09-21 ENCOUNTER — Other Ambulatory Visit: Payer: Self-pay | Admitting: Internal Medicine

## 2022-09-21 ENCOUNTER — Other Ambulatory Visit: Payer: Self-pay | Admitting: Family

## 2022-09-21 DIAGNOSIS — D500 Iron deficiency anemia secondary to blood loss (chronic): Secondary | ICD-10-CM

## 2022-09-21 MED ORDER — PANTOPRAZOLE SODIUM 40 MG PO TBEC: 40.0000 mg | DELAYED_RELEASE_TABLET | Freq: Every day | ORAL | 3 refills | Status: DC

## 2022-09-21 NOTE — Telephone Encounter (Signed)
Noted.  I replaced Nexium with pantoprazole.  Thanks

## 2022-09-21 NOTE — Progress Notes (Unsigned)
Prescription change

## 2022-09-22 ENCOUNTER — Inpatient Hospital Stay: Payer: 59

## 2022-09-22 ENCOUNTER — Other Ambulatory Visit: Payer: Self-pay | Admitting: Family

## 2022-09-22 ENCOUNTER — Telehealth: Payer: Self-pay

## 2022-09-22 ENCOUNTER — Inpatient Hospital Stay: Payer: 59 | Attending: Hematology & Oncology

## 2022-09-22 ENCOUNTER — Inpatient Hospital Stay: Payer: 59 | Admitting: Family

## 2022-09-22 DIAGNOSIS — D500 Iron deficiency anemia secondary to blood loss (chronic): Secondary | ICD-10-CM

## 2022-09-22 DIAGNOSIS — D509 Iron deficiency anemia, unspecified: Secondary | ICD-10-CM | POA: Diagnosis present

## 2022-09-22 LAB — CBC WITH DIFFERENTIAL (CANCER CENTER ONLY)
Abs Immature Granulocytes: 0.1 10*3/uL — ABNORMAL HIGH (ref 0.00–0.07)
Basophils Absolute: 0.1 10*3/uL (ref 0.0–0.1)
Basophils Relative: 1 %
Eosinophils Absolute: 0.3 10*3/uL (ref 0.0–0.5)
Eosinophils Relative: 5 %
HCT: 30.7 % — ABNORMAL LOW (ref 36.0–46.0)
Hemoglobin: 9.1 g/dL — ABNORMAL LOW (ref 12.0–15.0)
Immature Granulocytes: 2 %
Lymphocytes Relative: 28 %
Lymphs Abs: 1.7 10*3/uL (ref 0.7–4.0)
MCH: 21.6 pg — ABNORMAL LOW (ref 26.0–34.0)
MCHC: 29.6 g/dL — ABNORMAL LOW (ref 30.0–36.0)
MCV: 72.9 fL — ABNORMAL LOW (ref 80.0–100.0)
Monocytes Absolute: 0.6 10*3/uL (ref 0.1–1.0)
Monocytes Relative: 9 %
Neutro Abs: 3.4 10*3/uL (ref 1.7–7.7)
Neutrophils Relative %: 55 %
Platelet Count: 358 10*3/uL (ref 150–400)
RBC: 4.21 MIL/uL (ref 3.87–5.11)
RDW: 14.7 % (ref 11.5–15.5)
WBC Count: 6.2 10*3/uL (ref 4.0–10.5)
nRBC: 0 % (ref 0.0–0.2)

## 2022-09-22 LAB — IRON AND IRON BINDING CAPACITY (CC-WL,HP ONLY)
Iron: 11 ug/dL — ABNORMAL LOW (ref 28–170)
Saturation Ratios: 2 % — ABNORMAL LOW (ref 10.4–31.8)
TIBC: 559 ug/dL — ABNORMAL HIGH (ref 250–450)
UIBC: 548 ug/dL — ABNORMAL HIGH (ref 148–442)

## 2022-09-22 LAB — FERRITIN: Ferritin: 3 ng/mL — ABNORMAL LOW (ref 11–307)

## 2022-09-22 LAB — RETICULOCYTES
Immature Retic Fract: 23.7 % — ABNORMAL HIGH (ref 2.3–15.9)
RBC.: 4.25 MIL/uL (ref 3.87–5.11)
Retic Count, Absolute: 59.1 10*3/uL (ref 19.0–186.0)
Retic Ct Pct: 1.4 % (ref 0.4–3.1)

## 2022-09-22 NOTE — Telephone Encounter (Signed)
Pharmacy Patient Advocate Encounter  Received notification from [facility] that the request for prior authorization for Esomeprazole has been denied due to not meeting the following criteria: trial and failure for at least one month or unable to use 2 of the following: lansoprazole, omeprazole and pantoprazole.     Please be advised we currently do not have a Pharmacist to review denials, therefore you will need to process appeals accordingly as needed. Thanks for your support at this time.   You may call [phone] or fax [phone], to appeal.   Denial letter attached to chart

## 2022-09-23 NOTE — Telephone Encounter (Signed)
Duplicant.. MD already sent pantoprazole to pharmacy instead.Marland KitchenRaechel Chute

## 2022-09-29 ENCOUNTER — Inpatient Hospital Stay: Payer: 59

## 2022-09-29 ENCOUNTER — Inpatient Hospital Stay (HOSPITAL_BASED_OUTPATIENT_CLINIC_OR_DEPARTMENT_OTHER): Payer: 59 | Admitting: Family

## 2022-09-29 ENCOUNTER — Encounter: Payer: Self-pay | Admitting: Family

## 2022-09-29 VITALS — BP 134/79 | HR 94 | Temp 98.0°F | Resp 20 | Wt 221.0 lb

## 2022-09-29 VITALS — BP 127/67 | HR 90 | Resp 20

## 2022-09-29 DIAGNOSIS — D500 Iron deficiency anemia secondary to blood loss (chronic): Secondary | ICD-10-CM

## 2022-09-29 DIAGNOSIS — D509 Iron deficiency anemia, unspecified: Secondary | ICD-10-CM

## 2022-09-29 MED ORDER — SODIUM CHLORIDE 0.9 % IV SOLN: Freq: Once | INTRAVENOUS | Status: AC

## 2022-09-29 MED ORDER — SODIUM CHLORIDE 0.9 % IV SOLN
510.0000 mg | Freq: Once | INTRAVENOUS | Status: AC
  Administered 2022-09-29: 510 mg via INTRAVENOUS
  Filled 2022-09-29: qty 17

## 2022-09-29 NOTE — Progress Notes (Signed)
Hematology and Oncology Follow Up Visit  Amanda [name] [id number] 12-05-1965 57 y.o. 09/29/2022   Principle Diagnosis:   Chronic iron deficiency anemia     Current Therapy:        IV iron as indicated   Interim History:  Amanda [name] is here today for follow-up. She is symptomatic with fatigue, SOB, dizziness, palpitations, chest discomfort and tingling in the hands at times.  She has not noted any obvious blood loss. No abnormal bruising, no petechiae.  No fever, chills, n/v, cough, rash, abdominal pain or changes in bowel or bladder habits.  She states that she has had some tingling in the left side of her face which has been attributed to stress. She states that her PCP is aware and her workup has been negative.  No swelling or tenderness in her extremities at this time.  No falls or syncope reported.  Appetite and hydration are good. Weight is stable at 221 lbs.   ECOG Performance Status: 1 - Symptomatic but completely ambulatory  Medications:  Allergies as of 09/29/2022   No Known Allergies      Medication List        Accurate as of Sep 29, 2022  9:52 AM. If you have any questions, ask your nurse or doctor.          ACCRUFeR 30 MG Caps Generic drug: Ferric Maltol 1 po bid   b complex vitamins tablet Take 1 tablet by mouth daily.   cetirizine 10 MG tablet Commonly known as: ZYRTEC Take 10 mg by mouth daily.   pantoprazole 40 MG tablet Commonly known as: PROTONIX Take 1 tablet (40 mg total) by mouth daily.   phentermine 37.5 MG tablet Commonly known as: ADIPEX-P TAKE ONE TABLET BY MOUTH DAILY BEFORE BREAKFAST   traMADol 50 MG tablet Commonly known as: ULTRAM Take 0.5-1 tablets (25-50 mg total) by mouth every 6 (six) hours as needed for severe pain.   triamcinolone cream 0.5 % Commonly known as: KENALOG APPLY TO THE AFFECTED AREA(S) TOPICALLY FOUR TIMES DAILY   valACYclovir 500 MG tablet Commonly known as: Valtrex Use bid x 7 days prn cold sores    Vitamin B-12 1000 MCG Subl Place 2 tablets (2,000 mcg total) under the tongue daily.   Vitamin D3 50 MCG (2000 UT) capsule Take 1 capsule (2,000 Units total) by mouth daily.   zolpidem 5 MG tablet Commonly known as: AMBIEN TAKE ONE-HALF TO ONE TABLET BY MOUTH AT BEDTIME AS NEEDED FOR SLEEP        Allergies: No Known Allergies  Past Medical History, Surgical history, Social history, and Family History were reviewed and updated.  Review of Systems: All other 10 point review of systems is negative.   Physical Exam:  vitals were not taken for this visit.   Wt Readings from Last 3 Encounters:  08/30/22 222 lb (100.7 kg)  05/31/22 218 lb (98.9 kg)  12/28/21 221 lb 6.4 oz (100.4 kg)    Ocular: Sclerae unicteric, pupils equal, round and reactive to light Ear-nose-throat: Oropharynx clear, dentition fair Lymphatic: No cervical or supraclavicular adenopathy Lungs no rales or rhonchi, good excursion bilaterally Heart regular rate and rhythm, no murmur appreciated Abd soft, nontender, positive bowel sounds MSK no focal spinal tenderness, no joint edema Neuro: non-focal, well-oriented, appropriate affect Breasts: Deferred   Lab Results  Component Value Date   WBC 6.2 09/22/2022   HGB 9.1 (L) 09/22/2022   HCT 30.7 (L) 09/22/2022   MCV 72.9 (L) 09/22/2022  PLT 358 09/22/2022   Lab Results  Component Value Date   FERRITIN 3 (L) 09/22/2022   IRON 11 (L) 09/22/2022   TIBC 559 (H) 09/22/2022   UIBC 548 (H) 09/22/2022   IRONPCTSAT 2 (L) 09/22/2022   Lab Results  Component Value Date   RETICCTPCT 1.4 09/22/2022   RBC 4.25 09/22/2022   No results found for: "KPAFRELGTCHN", "LAMBDASER", "KAPLAMBRATIO" No results found for: "IGGSERUM", "IGA", "IGMSERUM" No results found for: "TOTALPROTELP", "ALBUMINELP", "A1GS", "A2GS", "BETS", "BETA2SER", "GAMS", "MSPIKE", "SPEI"   Chemistry      Component Value Date/Time   NA 139 05/31/2022 1346   K 4.3 05/31/2022 1346   CL 103  05/31/2022 1346   CO2 26 05/31/2022 1346   BUN 11 05/31/2022 1346   CREATININE 0.92 05/31/2022 1346   CREATININE 0.89 04/10/2021 0829      Component Value Date/Time   CALCIUM 9.7 05/31/2022 1346   ALKPHOS 66 05/31/2022 1346   AST 15 05/31/2022 1346   AST 13 (L) 04/10/2021 0829   ALT 16 05/31/2022 1346   ALT 20 04/10/2021 0829   BILITOT 0.2 05/31/2022 1346   BILITOT 0.2 (L) 04/10/2021 0829       Impression and Plan: Amanda [name] is a pleasant 57 yo [place] female with chronic iron deficiency anemia felt to be due to intermittent GI blood loss. She will get IV iron today and again next week.  Follow-up in 8 weeks.    Eileen Stanford, NP 5/29/20249:52 AM

## 2022-09-29 NOTE — Patient Instructions (Signed)

## 2022-10-01 ENCOUNTER — Other Ambulatory Visit: Payer: Self-pay | Admitting: Internal Medicine

## 2022-10-01 NOTE — Telephone Encounter (Signed)
Md is out of the office until June 4th. Pls advise on refill.Marland KitchenShearon Stalls

## 2022-10-08 ENCOUNTER — Inpatient Hospital Stay: Payer: 59 | Attending: Hematology & Oncology

## 2022-10-08 VITALS — BP 134/67 | HR 82 | Temp 97.7°F | Resp 18

## 2022-10-08 DIAGNOSIS — D509 Iron deficiency anemia, unspecified: Secondary | ICD-10-CM | POA: Diagnosis present

## 2022-10-08 MED ORDER — SODIUM CHLORIDE 0.9 % IV SOLN: Freq: Once | INTRAVENOUS | Status: AC

## 2022-10-08 MED ORDER — SODIUM CHLORIDE 0.9 % IV SOLN
510.0000 mg | Freq: Once | INTRAVENOUS | Status: AC
  Administered 2022-10-08: 510 mg via INTRAVENOUS
  Filled 2022-10-08: qty 510

## 2022-10-08 NOTE — Patient Instructions (Signed)

## 2022-10-20 ENCOUNTER — Telehealth: Payer: Self-pay | Admitting: Internal Medicine

## 2022-10-20 NOTE — Telephone Encounter (Signed)
Pharmacy called needing this medication sent over for PA Ferric Maltol (ACCRUFER) 30 MG CAPS. Please advise.

## 2022-10-21 ENCOUNTER — Telehealth: Payer: Self-pay

## 2022-10-21 ENCOUNTER — Other Ambulatory Visit (HOSPITAL_COMMUNITY): Payer: Self-pay

## 2022-10-21 ENCOUNTER — Encounter: Payer: Self-pay | Admitting: Family

## 2022-10-21 NOTE — Telephone Encounter (Signed)
Pharmacy Patient Advocate Encounter   Received notification that prior authorization for Accrufer 30mg  is required/requested.   PA submitted to [facility] via CoverMyMeds Key  # [id number] Status is pending

## 2022-10-25 NOTE — Telephone Encounter (Signed)
Pharmacy Patient Advocate Encounter  Received notification from [facility] that the request for prior authorization for Accrufer 30mg   has been denied due to not meeting the plan's medical necessity criteria for non-formulary drugs. To be considered for approval, you must show a trial and failure or inability to use all the preferred medications that are covered which include ferrous fumarate 29 milligram (mg) tablets, ferrous fumarate 324 mg tablets, ferrous gluconate 240 mg tablets, ferrous gluconate 324 mg tablets, ferrous sulfate 220 mg / 5 milliliter (mL) elixir / liquid, ferrous sulfate 325 mg enteric coated tablets, and ferrous sulfate 324 mg enteric coated tablets.  Please be advised we currently do not have a Pharmacist to review denials, therefore you will need to process appeals accordingly as needed. Thanks for your support at this time.   You may call [phone]  to appeal.

## 2022-10-26 NOTE — Telephone Encounter (Signed)
The patient should contact [facility] pharmacy. They should sell her Accrupher for about $30 a month after it was denied by her health insurance company.  She will have to pay. She is aware of it. The other alternative is to start IV iron via hematology clinic. Thanks

## 2022-11-12 ENCOUNTER — Other Ambulatory Visit: Payer: Self-pay | Admitting: Internal Medicine

## 2022-12-03 ENCOUNTER — Ambulatory Visit: Payer: 59 | Admitting: Family

## 2022-12-03 ENCOUNTER — Other Ambulatory Visit: Payer: 59

## 2022-12-07 ENCOUNTER — Inpatient Hospital Stay: Payer: 59 | Admitting: Family

## 2022-12-07 ENCOUNTER — Inpatient Hospital Stay: Payer: 59 | Attending: Hematology & Oncology

## 2022-12-15 ENCOUNTER — Other Ambulatory Visit: Payer: Self-pay | Admitting: Internal Medicine

## 2023-01-19 ENCOUNTER — Other Ambulatory Visit: Payer: Self-pay | Admitting: Internal Medicine

## 2023-02-08 ENCOUNTER — Ambulatory Visit: Payer: 59 | Admitting: Internal Medicine

## 2023-02-08 ENCOUNTER — Encounter: Payer: Self-pay | Admitting: Internal Medicine

## 2023-02-08 VITALS — BP 110/70 | HR 78 | Temp 98.1°F | Ht 62.0 in | Wt 218.0 lb

## 2023-02-08 DIAGNOSIS — E559 Vitamin D deficiency, unspecified: Secondary | ICD-10-CM

## 2023-02-08 DIAGNOSIS — D500 Iron deficiency anemia secondary to blood loss (chronic): Secondary | ICD-10-CM | POA: Diagnosis not present

## 2023-02-08 DIAGNOSIS — R5382 Chronic fatigue, unspecified: Secondary | ICD-10-CM

## 2023-02-08 DIAGNOSIS — E538 Deficiency of other specified B group vitamins: Secondary | ICD-10-CM

## 2023-02-08 LAB — CBC WITH DIFFERENTIAL/PLATELET
Basophils Absolute: 0.1 10*3/uL (ref 0.0–0.1)
Basophils Relative: 1.4 % (ref 0.0–3.0)
Eosinophils Absolute: 0.2 10*3/uL (ref 0.0–0.7)
Eosinophils Relative: 4.1 % (ref 0.0–5.0)
HCT: 37.5 % (ref 36.0–46.0)
Hemoglobin: 12.1 g/dL (ref 12.0–15.0)
Lymphocytes Relative: 27.4 % (ref 12.0–46.0)
Lymphs Abs: 1.5 10*3/uL (ref 0.7–4.0)
MCHC: 32.3 g/dL (ref 30.0–36.0)
MCV: 78.7 fL (ref 78.0–100.0)
Monocytes Absolute: 0.4 10*3/uL (ref 0.1–1.0)
Monocytes Relative: 7.5 % (ref 3.0–12.0)
Neutro Abs: 3.2 10*3/uL (ref 1.4–7.7)
Neutrophils Relative %: 59.6 % (ref 43.0–77.0)
Platelets: 269 10*3/uL (ref 150.0–400.0)
RBC: 4.76 Mil/uL (ref 3.87–5.11)
RDW: 15 % (ref 11.5–15.5)
WBC: 5.4 10*3/uL (ref 4.0–10.5)

## 2023-02-08 LAB — COMPREHENSIVE METABOLIC PANEL
ALT: 21 U/L (ref 0–35)
AST: 17 U/L (ref 0–37)
Albumin: 4.3 g/dL (ref 3.5–5.2)
Alkaline Phosphatase: 56 U/L (ref 39–117)
BUN: 14 mg/dL (ref 6–23)
CO2: 27 meq/L (ref 19–32)
Calcium: 9.6 mg/dL (ref 8.4–10.5)
Chloride: 105 meq/L (ref 96–112)
Creatinine, Ser: 0.94 mg/dL (ref 0.40–1.20)
GFR: 68.2 mL/min (ref >60.00)
Glucose, Bld: 101 mg/dL — ABNORMAL HIGH (ref 70–99)
Potassium: 4.5 meq/L (ref 3.5–5.1)
Sodium: 140 meq/L (ref 135–145)
Total Bilirubin: 0.4 mg/dL (ref 0.2–1.2)
Total Protein: 7.1 g/dL (ref 6.0–8.3)

## 2023-02-08 LAB — VITAMIN B12: Vitamin B-12: 361 pg/mL (ref 211–911)

## 2023-02-08 LAB — VITAMIN D 25 HYDROXY (VIT D DEFICIENCY, FRACTURES): VITD: 31.68 ng/mL (ref 30.00–100.00)

## 2023-02-08 LAB — TSH: TSH: 1.96 u[IU]/mL (ref 0.35–5.50)

## 2023-02-08 NOTE — Assessment & Plan Note (Signed)
Worsening CFS - multifactorial: anemia, obesity, deconditioning, stress, r/o OSA and RLS Pulm ref to r/o OSA Start walking Cont w/iron infusions Obtain labs including Vit B12

## 2023-02-08 NOTE — Progress Notes (Signed)
Subjective:  Patient ID: Amanda [name], female    DOB: 10/07/1965  Age: 57 y.o. MRN: [id number]  CC: Cough (Pts states she has had a cough and sneezing.)   HPI Amanda [name] presents for anemia, fatigue, insomnia f/u URI sx's resolved  Outpatient Medications Prior to Visit  Medication Sig Dispense Refill   b complex vitamins tablet Take 1 tablet by mouth daily. 100 tablet 3   cetirizine (ZYRTEC) 10 MG tablet Take 10 mg by mouth daily.     Cholecalciferol (VITAMIN D3) 2000 units capsule Take 1 capsule (2,000 Units total) by mouth daily. 100 capsule 3   Cyanocobalamin (VITAMIN B-12) 1000 MCG SUBL Place 2 tablets (2,000 mcg total) under the tongue daily. 100 tablet 3   esomeprazole (NEXIUM) 40 MG capsule Take 40 mg by mouth daily.     pantoprazole (PROTONIX) 40 MG tablet Take 1 tablet (40 mg total) by mouth daily. 90 tablet 3   phentermine (ADIPEX-P) 37.5 MG tablet TAKE ONE TABLET BY MOUTH ONE TIME DAILY BEFORE BREAKFAST 30 tablet 0   traMADol (ULTRAM) 50 MG tablet TAKE ONE TABLET BY MOUTH EVERY 6 HOURS AS NEEDED 120 tablet 0   triamcinolone cream (KENALOG) 0.5 % APPLY TO THE AFFECTED AREA(S) TOPICALLY FOUR TIMES DAILY 90 g 2   valACYclovir (VALTREX) 500 MG tablet TAKE ONE TABLET BY MOUTH TWICE A DAY FOR 7 DAYS AS NEEDED FOR COLD SORES 56 tablet 1   zolpidem (AMBIEN) 5 MG tablet TAKE ONE-HALF TO ONE TABLET BY MOUTH AT BEDTIME AS NEEDED FOR SLEEP 30 tablet 3   doxycycline (VIBRA-TABS) 100 MG tablet TAKE ONE TABLET BY MOUTH TWICE A DAY FOR SKIN BACTERIAL INFECTION (Patient not taking: Reported on 02/08/2023) 20 tablet 0   Ferric Maltol (ACCRUFER) 30 MG CAPS 1 po bid (Patient not taking: Reported on 09/29/2022) 60 capsule 5   No facility-administered medications prior to visit.    ROS: Review of Systems  Constitutional:  Positive for fatigue. Negative for activity change, appetite change, chills and unexpected weight change.  HENT:  Positive for congestion. Negative for mouth sores and sinus  pressure.   Eyes:  Negative for visual disturbance.  Respiratory:  Negative for cough and chest tightness.   Gastrointestinal:  Negative for abdominal pain and nausea.  Genitourinary:  Negative for difficulty urinating, frequency and vaginal pain.  Musculoskeletal:  Positive for arthralgias. Negative for back pain and gait problem.  Skin:  Negative for pallor and rash.  Neurological:  Negative for dizziness, tremors, weakness, numbness and headaches.  Psychiatric/Behavioral:  Negative for confusion and sleep disturbance.     Objective:  BP 110/70 (BP Location: Left Arm, Patient Position: Sitting, Cuff Size: Normal)   Pulse 78   Temp 98.1 F (36.7 C) (Oral)   Ht 5\' 2"  (1.575 m)   Wt 218 lb (98.9 kg)   SpO2 98%   BMI 39.87 kg/m   BP Readings from Last 3 Encounters:  02/08/23 110/70  10/08/22 134/67  09/29/22 127/67    Wt Readings from Last 3 Encounters:  02/08/23 218 lb (98.9 kg)  09/29/22 221 lb (100.2 kg)  08/30/22 222 lb (100.7 kg)    Physical Exam Constitutional:      General: She is not in acute distress.    Appearance: She is well-developed. She is obese.  HENT:     Head: Normocephalic.     Right Ear: External ear normal.     Left Ear: External ear normal.     Nose: Nose normal.  Eyes:     General:        Right eye: No discharge.        Left eye: No discharge.     Conjunctiva/sclera: Conjunctivae normal.     Pupils: Pupils are equal, round, and reactive to light.  Neck:     Thyroid: No thyromegaly.     Vascular: No JVD.     Trachea: No tracheal deviation.  Cardiovascular:     Rate and Rhythm: Normal rate and regular rhythm.     Heart sounds: Normal heart sounds.  Pulmonary:     Effort: No respiratory distress.     Breath sounds: No stridor. No wheezing.  Abdominal:     General: Bowel sounds are normal. There is no distension.     Palpations: Abdomen is soft. There is no mass.     Tenderness: There is no abdominal tenderness. There is no guarding or  rebound.  Musculoskeletal:        General: No tenderness.     Cervical back: Normal range of motion and neck supple. No rigidity.     Right lower leg: No edema.     Left lower leg: No edema.  Lymphadenopathy:     Cervical: No cervical adenopathy.  Skin:    Findings: No erythema or rash.  Neurological:     Cranial Nerves: No cranial nerve deficit.     Motor: No abnormal muscle tone.     Coordination: Coordination normal.     Deep Tendon Reflexes: Reflexes normal.  Psychiatric:        Behavior: Behavior normal.        Thought Content: Thought content normal.        Judgment: Judgment normal.     Lab Results  Component Value Date   WBC 6.2 09/22/2022   HGB 9.1 (L) 09/22/2022   HCT 30.7 (L) 09/22/2022   PLT 358 09/22/2022   GLUCOSE 104 (H) 05/31/2022   ALT 16 05/31/2022   AST 15 05/31/2022   NA 139 05/31/2022   K 4.3 05/31/2022   CL 103 05/31/2022   CREATININE 0.92 05/31/2022   BUN 11 05/31/2022   CO2 26 05/31/2022   TSH 3.22 08/30/2022   INR 1.03 10/14/2017    No results found.  Assessment & Plan:   Problem List Items Addressed This Visit     Anemia, iron deficiency - Primary    Check CBC, iron      Relevant Orders   CBC with Differential/Platelet   Iron, TIBC and Ferritin Panel   TSH   Comprehensive metabolic panel   Vitamin B12   VITAMIN D 25 Hydroxy (Vit-D Deficiency, Fractures)   Fatigue    Worsening CFS - multifactorial: anemia, obesity, deconditioning, stress, r/o OSA and RLS Pulm ref to r/o OSA Start walking Cont w/iron infusions Obtain labs including Vit B12      Relevant Orders   CBC with Differential/Platelet   Iron, TIBC and Ferritin Panel   TSH   Comprehensive metabolic panel   Vitamin B12   VITAMIN D 25 Hydroxy (Vit-D Deficiency, Fractures)   B12 deficiency    Borderline  Cont w/B12 SL      Relevant Orders   Vitamin B12   Vitamin D deficiency    On Vit D      Relevant Orders   VITAMIN D 25 Hydroxy (Vit-D Deficiency,  Fractures)      No orders of the defined types were placed in this encounter.  Follow-up: Return in about 3 months (around 05/11/2023) for a follow-up visit.  Sonda Primes, MD

## 2023-02-08 NOTE — Assessment & Plan Note (Signed)
Borderline  Cont w/B12 SL

## 2023-02-08 NOTE — Assessment & Plan Note (Signed)
On Vit D 

## 2023-02-08 NOTE — Assessment & Plan Note (Signed)
Check CBC, iron 

## 2023-02-09 LAB — IRON,TIBC AND FERRITIN PANEL
%SAT: 11 % — ABNORMAL LOW (ref 16–45)
Ferritin: 9 ng/mL — ABNORMAL LOW (ref 16–232)
Iron: 50 ug/dL (ref 45–160)
TIBC: 464 ug/dL — ABNORMAL HIGH (ref 250–450)

## 2023-03-19 ENCOUNTER — Other Ambulatory Visit: Payer: Self-pay | Admitting: Internal Medicine

## 2023-03-25 ENCOUNTER — Telehealth: Payer: Self-pay | Admitting: Hematology & Oncology

## 2023-03-25 NOTE — Telephone Encounter (Signed)
Called pt for scheduling. LVM to return call.

## 2023-03-28 ENCOUNTER — Telehealth: Payer: Self-pay | Admitting: Medical Oncology

## 2023-03-28 NOTE — Telephone Encounter (Signed)
Called to schedule appts per [facility]. LVM to return call for scheduling.

## 2023-03-30 ENCOUNTER — Other Ambulatory Visit: Payer: Self-pay

## 2023-03-30 ENCOUNTER — Inpatient Hospital Stay: Payer: 59 | Attending: Hematology & Oncology

## 2023-03-30 ENCOUNTER — Inpatient Hospital Stay (HOSPITAL_BASED_OUTPATIENT_CLINIC_OR_DEPARTMENT_OTHER): Payer: 59 | Admitting: Medical Oncology

## 2023-03-30 VITALS — BP 133/81 | HR 92 | Temp 97.8°F | Resp 20 | Wt 226.0 lb

## 2023-03-30 DIAGNOSIS — D500 Iron deficiency anemia secondary to blood loss (chronic): Secondary | ICD-10-CM

## 2023-03-30 DIAGNOSIS — K922 Gastrointestinal hemorrhage, unspecified: Secondary | ICD-10-CM | POA: Diagnosis present

## 2023-03-30 DIAGNOSIS — E639 Nutritional deficiency, unspecified: Secondary | ICD-10-CM | POA: Diagnosis not present

## 2023-03-30 LAB — CBC WITH DIFFERENTIAL (CANCER CENTER ONLY)
Abs Immature Granulocytes: 0.05 10*3/uL (ref 0.00–0.07)
Basophils Absolute: 0.1 10*3/uL (ref 0.0–0.1)
Basophils Relative: 2 %
Eosinophils Absolute: 0.3 10*3/uL (ref 0.0–0.5)
Eosinophils Relative: 6 %
HCT: 31.7 % — ABNORMAL LOW (ref 36.0–46.0)
Hemoglobin: 9.7 g/dL — ABNORMAL LOW (ref 12.0–15.0)
Immature Granulocytes: 1 %
Lymphocytes Relative: 32 %
Lymphs Abs: 1.9 10*3/uL (ref 0.7–4.0)
MCH: 23.4 pg — ABNORMAL LOW (ref 26.0–34.0)
MCHC: 30.6 g/dL (ref 30.0–36.0)
MCV: 76.4 fL — ABNORMAL LOW (ref 80.0–100.0)
Monocytes Absolute: 0.5 10*3/uL (ref 0.1–1.0)
Monocytes Relative: 9 %
Neutro Abs: 3.1 10*3/uL (ref 1.7–7.7)
Neutrophils Relative %: 50 %
Platelet Count: 298 10*3/uL (ref 150–400)
RBC: 4.15 MIL/uL (ref 3.87–5.11)
RDW: 14.6 % (ref 11.5–15.5)
WBC Count: 6 10*3/uL (ref 4.0–10.5)
nRBC: 0 % (ref 0.0–0.2)

## 2023-03-30 LAB — IRON AND IRON BINDING CAPACITY (CC-WL,HP ONLY)
Iron: 19 ug/dL — ABNORMAL LOW (ref 28–170)
Saturation Ratios: 4 % — ABNORMAL LOW (ref 10.4–31.8)
TIBC: 547 ug/dL — ABNORMAL HIGH (ref 250–450)
UIBC: 528 ug/dL — ABNORMAL HIGH (ref 148–442)

## 2023-03-30 LAB — RETICULOCYTES
Immature Retic Fract: 25 % — ABNORMAL HIGH (ref 2.3–15.9)
RBC.: 4.18 MIL/uL (ref 3.87–5.11)
Retic Count, Absolute: 66 10*3/uL (ref 19.0–186.0)
Retic Ct Pct: 1.6 % (ref 0.4–3.1)

## 2023-03-30 LAB — FERRITIN: Ferritin: 4 ng/mL — ABNORMAL LOW (ref 11–307)

## 2023-03-30 NOTE — Progress Notes (Signed)
Hematology and Oncology Follow Up Visit  Amanda [name] [id number] 01-09-1966 57 y.o. 03/30/2023   Principle Diagnosis:   Chronic iron deficiency anemia     Current Therapy:        IV iron as indicated- Feraheme- last dose 10/08/2022   Interim History:  Amanda [name] is here today for follow-up.   Her symptoms of fatigue, SOB, dizziness, palpitations, chest discomfort and tingling in the hands at times are slowly returning  She is followed by GI and no internal bleeding is known.  There has been no bleeding to her knowledge: denies epistaxis, gingivitis, hemoptysis, hematemesis, hematuria, melena, excessive bruising, blood donation.  No fever, chills, n/v, cough, rash, abdominal pain or changes in bowel or bladder habits.  No swelling or tenderness in her extremities at this time.  No falls or syncope reported.  Appetite and hydration are good. Wt Readings from Last 3 Encounters:  03/30/23 226 lb (102.5 kg)  02/08/23 218 lb (98.9 kg)  09/29/22 221 lb (100.2 kg)   ECOG Performance Status: 1 - Symptomatic but completely ambulatory  Medications:  Allergies as of 03/30/2023   No Known Allergies      Medication List        Accurate as of March 30, 2023  9:42 AM. If you have any questions, ask your nurse or doctor.          ACCRUFeR 30 MG Caps Generic drug: Ferric Maltol 1 po bid   b complex vitamins tablet Take 1 tablet by mouth daily.   cetirizine 10 MG tablet Commonly known as: ZYRTEC Take 10 mg by mouth daily.   doxycycline 100 MG tablet Commonly known as: VIBRA-TABS TAKE ONE TABLET BY MOUTH TWICE A DAY FOR SKIN BACTERIAL INFECTION   esomeprazole 40 MG capsule Commonly known as: NEXIUM Take 40 mg by mouth daily.   pantoprazole 40 MG tablet Commonly known as: PROTONIX Take 1 tablet (40 mg total) by mouth daily.   phentermine 37.5 MG tablet Commonly known as: ADIPEX-P TAKE ONE TABLET BY MOUTH ONE TIME DAILY BEFORE BREAKFAST **SCHEDULE OFFICE VISIT**    traMADol 50 MG tablet Commonly known as: ULTRAM TAKE ONE TABLET BY MOUTH EVERY 6 HOURS AS NEEDED   triamcinolone cream 0.5 % Commonly known as: KENALOG APPLY TO THE AFFECTED AREA(S) TOPICALLY FOUR TIMES DAILY   valACYclovir 500 MG tablet Commonly known as: VALTREX TAKE ONE TABLET BY MOUTH TWICE A DAY FOR 7 DAYS AS NEEDED FOR COLD SORES   Vitamin B-12 1000 MCG Subl Place 2 tablets (2,000 mcg total) under the tongue daily.   Vitamin D3 50 MCG (2000 UT) capsule Take 1 capsule (2,000 Units total) by mouth daily.   zolpidem 5 MG tablet Commonly known as: AMBIEN TAKE ONE-HALF TO ONE TABLET BY MOUTH AT BEDTIME AS NEEDED FOR SLEEP        Allergies: No Known Allergies  Past Medical History, Surgical history, Social history, and Family History were reviewed and updated.  Review of Systems: All other 10 point review of systems is negative.   Physical Exam:  weight is 226 lb (102.5 kg). Her oral temperature is 97.8 F (36.6 C). Her blood pressure is 133/81 and her pulse is 92. Her respiration is 20 and oxygen saturation is 100%.   Wt Readings from Last 3 Encounters:  03/30/23 226 lb (102.5 kg)  02/08/23 218 lb (98.9 kg)  09/29/22 221 lb (100.2 kg)   Ocular: Sclerae unicteric, pupils equal, round and reactive to light Ear-nose-throat: Oropharynx clear, dentition  fair Lymphatic: No cervical or supraclavicular adenopathy Lungs no rales or rhonchi, good excursion bilaterally Heart regular rate and rhythm, no murmur appreciated Abd soft, nontender, positive bowel sounds MSK no focal spinal tenderness, no joint edema Neuro: non-focal, well-oriented, appropriate affect  Lab Results  Component Value Date   WBC 6.0 03/30/2023   HGB 9.7 (L) 03/30/2023   HCT 31.7 (L) 03/30/2023   MCV 76.4 (L) 03/30/2023   PLT 298 03/30/2023   Lab Results  Component Value Date   FERRITIN 9 (L) 02/08/2023   IRON 50 02/08/2023   TIBC 464 (H) 02/08/2023   UIBC 548 (H) 09/22/2022   IRONPCTSAT  11 (L) 02/08/2023   Lab Results  Component Value Date   RETICCTPCT 1.6 03/30/2023   RBC 4.18 03/30/2023   No results found for: "KPAFRELGTCHN", "LAMBDASER", "KAPLAMBRATIO" No results found for: "IGGSERUM", "IGA", "IGMSERUM" No results found for: "TOTALPROTELP", "ALBUMINELP", "A1GS", "A2GS", "BETS", "BETA2SER", "GAMS", "MSPIKE", "SPEI"   Chemistry      Component Value Date/Time   NA 140 02/08/2023 1514   K 4.5 02/08/2023 1514   CL 105 02/08/2023 1514   CO2 27 02/08/2023 1514   BUN 14 02/08/2023 1514   CREATININE 0.94 02/08/2023 1514   CREATININE 0.89 04/10/2021 0829      Component Value Date/Time   CALCIUM 9.6 02/08/2023 1514   ALKPHOS 56 02/08/2023 1514   AST 17 02/08/2023 1514   AST 13 (L) 04/10/2021 0829   ALT 21 02/08/2023 1514   ALT 20 04/10/2021 0829   BILITOT 0.4 02/08/2023 1514   BILITOT 0.2 (L) 04/10/2021 0829     Encounter Diagnosis  Name Primary?   Iron deficiency anemia due to chronic blood loss Yes   Impression and Plan: Amanda [name] is a pleasant 57 yo [place] female with chronic iron deficiency anemia felt to be due to intermittent GI blood loss. Given her negative GI work up, history of nutritional deficiencies, and continued anemia I will also check her B12 levels and a CMP.   Today Hgb is 9.7. MCV is low. Suspect she will need IV iron.  Iron levels pending- we will place orders today  RTC weekly x 2 for IV Feraheme RTC 1 month APP, labs (CBC, CMP, iron, ferritin, retic, B12)-[place]  Rushie Chestnut, PA-C 11/27/20249:42 AM

## 2023-04-05 ENCOUNTER — Inpatient Hospital Stay: Payer: 59 | Attending: Hematology & Oncology

## 2023-04-05 VITALS — BP 134/94 | HR 79 | Temp 98.0°F | Resp 18

## 2023-04-05 DIAGNOSIS — D500 Iron deficiency anemia secondary to blood loss (chronic): Secondary | ICD-10-CM | POA: Diagnosis present

## 2023-04-05 DIAGNOSIS — D509 Iron deficiency anemia, unspecified: Secondary | ICD-10-CM

## 2023-04-05 DIAGNOSIS — K922 Gastrointestinal hemorrhage, unspecified: Secondary | ICD-10-CM | POA: Insufficient documentation

## 2023-04-05 MED ORDER — SODIUM CHLORIDE 0.9 % IV SOLN: Freq: Once | INTRAVENOUS | Status: AC

## 2023-04-05 MED ORDER — SODIUM CHLORIDE 0.9 % IV SOLN
510.0000 mg | Freq: Once | INTRAVENOUS | Status: AC
  Administered 2023-04-05: 510 mg via INTRAVENOUS
  Filled 2023-04-05: qty 17

## 2023-04-05 NOTE — Patient Instructions (Signed)
 Ferumoxytol Injection What is this medication? FERUMOXYTOL (FER ue MOX i tol) treats low levels of iron in your body (iron deficiency anemia). Iron is a mineral that plays an important role in making red blood cells, which carry oxygen from your lungs to the rest of your body. This medicine may be used for other purposes; ask your health care provider or pharmacist if you have questions. COMMON BRAND NAME(S): Feraheme What should I tell my care team before I take this medication? They need to know if you have any of these conditions: Anemia not caused by low iron levels High levels of iron in the blood Magnetic resonance imaging (MRI) test scheduled An unusual or allergic reaction to iron, other medications, foods, dyes, or preservatives Pregnant or trying to get pregnant Breastfeeding How should I use this medication? This medication is injected into a vein. It is given by your care team in a hospital or clinic setting. Talk to your care team the use of this medication in children. Special care may be needed. Overdosage: If you think you have taken too much of this medicine contact a poison control center or emergency room at once. NOTE: This medicine is only for you. Do not share this medicine with others. What if I miss a dose? It is important not to miss your dose. Call your care team if you are unable to keep an appointment. What may interact with this medication? Other iron products This list may not describe all possible interactions. Give your health care provider a list of all the medicines, herbs, non-prescription drugs, or dietary supplements you use. Also tell them if you smoke, drink alcohol, or use illegal drugs. Some items may interact with your medicine. What should I watch for while using this medication? Visit your care team regularly. Tell your care team if your symptoms do not start to get better or if they get worse. You may need blood work done while you are taking this  medication. You may need to follow a special diet. Talk to your care team. Foods that contain iron include: whole grains/cereals, dried fruits, beans, or peas, leafy green vegetables, and organ meats (liver, kidney). What side effects may I notice from receiving this medication? Side effects that you should report to your care team as soon as possible: Allergic reactions--skin rash, itching, hives, swelling of the face, lips, tongue, or throat Low blood pressure--dizziness, feeling faint or lightheaded, blurry vision Shortness of breath Side effects that usually do not require medical attention (report to your care team if they continue or are bothersome): Flushing Headache Joint pain Muscle pain Nausea Pain, redness, or irritation at injection site This list may not describe all possible side effects. Call your doctor for medical advice about side effects. You may report side effects to FDA at 1-800-FDA-1088. Where should I keep my medication? This medication is given in a hospital or clinic. It will not be stored at home. NOTE: This sheet is a summary. It may not cover all possible information. If you have questions about this medicine, talk to your doctor, pharmacist, or health care provider.  2024 Elsevier/Gold Standard (2022-09-24 00:00:00)

## 2023-04-05 NOTE — Progress Notes (Signed)
Patient refused to wait 30 minutes post infusion. Released stable and ASX. 

## 2023-04-12 ENCOUNTER — Inpatient Hospital Stay: Payer: 59

## 2023-04-12 VITALS — BP 136/80 | HR 89 | Temp 97.8°F | Resp 17

## 2023-04-12 DIAGNOSIS — D509 Iron deficiency anemia, unspecified: Secondary | ICD-10-CM

## 2023-04-12 DIAGNOSIS — D500 Iron deficiency anemia secondary to blood loss (chronic): Secondary | ICD-10-CM | POA: Diagnosis not present

## 2023-04-12 MED ORDER — SODIUM CHLORIDE 0.9 % IV SOLN: Freq: Once | INTRAVENOUS | Status: AC

## 2023-04-12 MED ORDER — SODIUM CHLORIDE 0.9 % IV SOLN
510.0000 mg | Freq: Once | INTRAVENOUS | Status: AC
  Administered 2023-04-12: 510 mg via INTRAVENOUS
  Filled 2023-04-12: qty 17

## 2023-04-24 ENCOUNTER — Other Ambulatory Visit: Payer: Self-pay | Admitting: Internal Medicine

## 2023-04-29 ENCOUNTER — Encounter: Payer: Self-pay | Admitting: Family

## 2023-04-29 ENCOUNTER — Inpatient Hospital Stay (HOSPITAL_BASED_OUTPATIENT_CLINIC_OR_DEPARTMENT_OTHER): Payer: 59 | Admitting: Family

## 2023-04-29 ENCOUNTER — Other Ambulatory Visit: Payer: Self-pay | Admitting: Internal Medicine

## 2023-04-29 ENCOUNTER — Inpatient Hospital Stay: Payer: 59

## 2023-04-29 VITALS — BP 143/71 | HR 81 | Temp 97.8°F | Resp 17 | Ht 62.0 in | Wt 226.0 lb

## 2023-04-29 DIAGNOSIS — E639 Nutritional deficiency, unspecified: Secondary | ICD-10-CM

## 2023-04-29 DIAGNOSIS — E538 Deficiency of other specified B group vitamins: Secondary | ICD-10-CM

## 2023-04-29 DIAGNOSIS — D500 Iron deficiency anemia secondary to blood loss (chronic): Secondary | ICD-10-CM

## 2023-04-29 DIAGNOSIS — D509 Iron deficiency anemia, unspecified: Secondary | ICD-10-CM | POA: Diagnosis not present

## 2023-04-29 LAB — CBC
HCT: 39 % (ref 36.0–46.0)
Hemoglobin: 12.4 g/dL (ref 12.0–15.0)
MCH: 25.4 pg — ABNORMAL LOW (ref 26.0–34.0)
MCHC: 31.8 g/dL (ref 30.0–36.0)
MCV: 79.9 fL — ABNORMAL LOW (ref 80.0–100.0)
Platelets: 294 10*3/uL (ref 150–400)
RBC: 4.88 MIL/uL (ref 3.87–5.11)
RDW: 21.1 % — ABNORMAL HIGH (ref 11.5–15.5)
WBC: 4.4 10*3/uL (ref 4.0–10.5)
nRBC: 0 % (ref 0.0–0.2)

## 2023-04-29 LAB — CMP (CANCER CENTER ONLY)
ALT: 16 U/L (ref 0–44)
AST: 16 U/L (ref 15–41)
Albumin: 4.2 g/dL (ref 3.5–5.0)
Alkaline Phosphatase: 51 U/L (ref 38–126)
Anion gap: 9 (ref 5–15)
BUN: 17 mg/dL (ref 6–20)
CO2: 27 mmol/L (ref 22–32)
Calcium: 9.3 mg/dL (ref 8.9–10.3)
Chloride: 106 mmol/L (ref 98–111)
Creatinine: 0.86 mg/dL (ref 0.44–1.00)
GFR, Estimated: >60 mL/min (ref >60)
Glucose, Bld: 111 mg/dL — ABNORMAL HIGH (ref 70–99)
Potassium: 4.1 mmol/L (ref 3.5–5.1)
Sodium: 142 mmol/L (ref 135–145)
Total Bilirubin: 0.3 mg/dL (ref <1.2)
Total Protein: 7 g/dL (ref 6.5–8.1)

## 2023-04-29 LAB — IRON AND IRON BINDING CAPACITY (CC-WL,HP ONLY)
Iron: 80 ug/dL (ref 28–170)
Saturation Ratios: 22 % (ref 10.4–31.8)
TIBC: 363 ug/dL (ref 250–450)
UIBC: 283 ug/dL (ref 148–442)

## 2023-04-29 LAB — FERRITIN: Ferritin: 130 ng/mL (ref 11–307)

## 2023-04-29 LAB — FOLATE: Folate: 13.9 ng/mL (ref >5.9)

## 2023-04-29 LAB — VITAMIN B12: Vitamin B-12: 372 pg/mL (ref 180–914)

## 2023-04-29 NOTE — Progress Notes (Signed)
Hematology and Oncology Follow Up Visit  Amanda [name] [id number] October 10, 1965 57 y.o. 04/29/2023   Principle Diagnosis:  Chronic iron deficiency anemia     Current Therapy:        IV iron as indicated   Interim History:  Amanda [name] is here today with her sweet granddaughter for follow-up. She is feeling better since receiving IV iron last month. Energy has improved.  She does notice some dizziness and palpitations when she leans forward or bends down to pick something up. She is being careful.  No falls or syncope reported.  No fever, chills, n/v, cough, rash, SOB, chest pain, abdominal pain or changes in bowel or bladder habits.  No blood loss noted. No abnormal bruising, no petechiae.  She has tingling in her heels when she lays down. No swelling or tenderness in her extremities.  Appetite and hydration are good. Weight is stable at 226 lbs.   ECOG Performance Status: 1 - Symptomatic but completely ambulatory  Medications:  Allergies as of 04/29/2023   No Known Allergies      Medication List        Accurate as of April 29, 2023  9:57 AM. If you have any questions, ask your nurse or doctor.          ACCRUFeR 30 MG Caps Generic drug: Ferric Maltol 1 po bid   b complex vitamins tablet Take 1 tablet by mouth daily.   cetirizine 10 MG tablet Commonly known as: ZYRTEC Take 10 mg by mouth daily.   doxycycline 100 MG tablet Commonly known as: VIBRA-TABS TAKE ONE TABLET BY MOUTH TWICE A DAY FOR SKIN BACTERIAL INFECTION   esomeprazole 40 MG capsule Commonly known as: NEXIUM Take 40 mg by mouth daily.   pantoprazole 40 MG tablet Commonly known as: PROTONIX Take 1 tablet (40 mg total) by mouth daily.   phentermine 37.5 MG tablet Commonly known as: ADIPEX-P TAKE ONE TABLET BY MOUTH ONE TIME DAILY BEFORE BREAKFAST  Schedule office visit every 3 months   traMADol 50 MG tablet Commonly known as: ULTRAM TAKE ONE TABLET BY MOUTH EVERY 6 HOURS AS NEEDED    triamcinolone cream 0.5 % Commonly known as: KENALOG APPLY TO THE AFFECTED AREA(S) TOPICALLY FOUR TIMES DAILY   valACYclovir 500 MG tablet Commonly known as: VALTREX TAKE ONE TABLET BY MOUTH TWICE A DAY FOR 7 DAYS AS NEEDED FOR COLD SORES   Vitamin B-12 1000 MCG Subl Place 2 tablets (2,000 mcg total) under the tongue daily.   Vitamin D3 50 MCG (2000 UT) capsule Take 1 capsule (2,000 Units total) by mouth daily.   zolpidem 5 MG tablet Commonly known as: AMBIEN TAKE ONE-HALF TO ONE TABLET BY MOUTH AT BEDTIME AS NEEDED FOR SLEEP        Allergies: No Known Allergies  Past Medical History, Surgical history, Social history, and Family History were reviewed and updated.  Review of Systems: All other 10 point review of systems is negative.   Physical Exam:  height is 5\' 2"  (1.575 m) and weight is 226 lb (102.5 kg). Her oral temperature is 97.8 F (36.6 C). Her blood pressure is 143/71 (abnormal) and her pulse is 81. Her respiration is 17 and oxygen saturation is 100%.   Wt Readings from Last 3 Encounters:  04/29/23 226 lb (102.5 kg)  03/30/23 226 lb (102.5 kg)  02/08/23 218 lb (98.9 kg)    Ocular: Sclerae unicteric, pupils equal, round and reactive to light Ear-nose-throat: Oropharynx clear, dentition fair Lymphatic: No  cervical or supraclavicular adenopathy Lungs no rales or rhonchi, good excursion bilaterally Heart regular rate and rhythm, no murmur appreciated Abd soft, nontender, positive bowel sounds MSK no focal spinal tenderness, no joint edema Neuro: non-focal, well-oriented, appropriate affect Breasts: Deferred   Lab Results  Component Value Date   WBC 4.4 04/29/2023   HGB 12.4 04/29/2023   HCT 39.0 04/29/2023   MCV 79.9 (L) 04/29/2023   PLT 294 04/29/2023   Lab Results  Component Value Date   FERRITIN 4 (L) 03/30/2023   IRON 19 (L) 03/30/2023   TIBC 547 (H) 03/30/2023   UIBC 528 (H) 03/30/2023   IRONPCTSAT 4 (L) 03/30/2023   Lab Results  Component  Value Date   RETICCTPCT 1.6 03/30/2023   RBC 4.88 04/29/2023   No results found for: "KPAFRELGTCHN", "LAMBDASER", "KAPLAMBRATIO" No results found for: "IGGSERUM", "IGA", "IGMSERUM" No results found for: "TOTALPROTELP", "ALBUMINELP", "A1GS", "A2GS", "BETS", "BETA2SER", "GAMS", "MSPIKE", "SPEI"   Chemistry      Component Value Date/Time   NA 140 02/08/2023 1514   K 4.5 02/08/2023 1514   CL 105 02/08/2023 1514   CO2 27 02/08/2023 1514   BUN 14 02/08/2023 1514   CREATININE 0.94 02/08/2023 1514   CREATININE 0.89 04/10/2021 0829      Component Value Date/Time   CALCIUM 9.6 02/08/2023 1514   ALKPHOS 56 02/08/2023 1514   AST 17 02/08/2023 1514   AST 13 (L) 04/10/2021 0829   ALT 21 02/08/2023 1514   ALT 20 04/10/2021 0829   BILITOT 0.4 02/08/2023 1514   BILITOT 0.2 (L) 04/10/2021 0829       Impression and Plan: Amanda [name] is a pleasant 57 yo [place] female with chronic iron deficiency anemia felt to be due to intermittent GI blood loss.  Iron studies and B 12 are pending. We will replace if needed.  Follow-up in 6 weeks.   Eileen Stanford, NP 12/27/20249:57 AM

## 2023-05-02 ENCOUNTER — Other Ambulatory Visit: Payer: Self-pay | Admitting: Internal Medicine

## 2023-05-26 ENCOUNTER — Other Ambulatory Visit: Payer: Self-pay | Admitting: Internal Medicine

## 2023-06-10 ENCOUNTER — Inpatient Hospital Stay: Payer: 59

## 2023-06-10 ENCOUNTER — Inpatient Hospital Stay: Payer: 59 | Admitting: Family

## 2023-06-16 ENCOUNTER — Other Ambulatory Visit: Payer: Self-pay | Admitting: Internal Medicine

## 2023-07-01 ENCOUNTER — Ambulatory Visit: Payer: 59 | Admitting: Family

## 2023-07-01 ENCOUNTER — Inpatient Hospital Stay: Payer: 59

## 2023-07-22 ENCOUNTER — Encounter: Payer: Self-pay | Admitting: Family

## 2023-07-29 ENCOUNTER — Inpatient Hospital Stay: Payer: 59 | Admitting: Family

## 2023-07-29 ENCOUNTER — Inpatient Hospital Stay: Payer: 59

## 2023-08-01 ENCOUNTER — Telehealth: Payer: Self-pay | Admitting: Internal Medicine

## 2023-08-01 DIAGNOSIS — E538 Deficiency of other specified B group vitamins: Secondary | ICD-10-CM

## 2023-08-01 DIAGNOSIS — G4700 Insomnia, unspecified: Secondary | ICD-10-CM

## 2023-08-01 DIAGNOSIS — D500 Iron deficiency anemia secondary to blood loss (chronic): Secondary | ICD-10-CM

## 2023-08-01 DIAGNOSIS — K219 Gastro-esophageal reflux disease without esophagitis: Secondary | ICD-10-CM

## 2023-08-01 DIAGNOSIS — R5382 Chronic fatigue, unspecified: Secondary | ICD-10-CM

## 2023-08-01 DIAGNOSIS — E559 Vitamin D deficiency, unspecified: Secondary | ICD-10-CM

## 2023-08-01 MED ORDER — PANTOPRAZOLE SODIUM 40 MG PO TBEC: 40.0000 mg | DELAYED_RELEASE_TABLET | Freq: Every day | ORAL | 1 refills | Status: DC

## 2023-08-01 MED ORDER — TRIAMCINOLONE ACETONIDE 0.5 % EX CREA: TOPICAL_CREAM | Freq: Two times a day (BID) | CUTANEOUS | 0 refills | Status: DC

## 2023-08-01 MED ORDER — PHENTERMINE HCL 37.5 MG PO TABS: ORAL_TABLET | ORAL | 1 refills | Status: AC

## 2023-08-01 MED ORDER — ZOLPIDEM TARTRATE 5 MG PO TABS: ORAL_TABLET | ORAL | 3 refills | Status: AC

## 2023-08-01 MED ORDER — TRAMADOL HCL 50 MG PO TABS: 50.0000 mg | ORAL_TABLET | Freq: Four times a day (QID) | ORAL | 0 refills | Status: AC | PRN

## 2023-08-01 NOTE — Telephone Encounter (Signed)
 Needs Rx Sch OV w/labs

## 2023-09-02 ENCOUNTER — Inpatient Hospital Stay: Payer: Self-pay | Attending: Family

## 2023-09-02 ENCOUNTER — Inpatient Hospital Stay (HOSPITAL_BASED_OUTPATIENT_CLINIC_OR_DEPARTMENT_OTHER): Payer: Self-pay | Admitting: Family

## 2023-09-02 ENCOUNTER — Encounter: Payer: Self-pay | Admitting: Family

## 2023-09-02 VITALS — BP 122/74 | HR 95 | Temp 97.6°F | Resp 18 | Ht 62.0 in | Wt 229.8 lb

## 2023-09-02 DIAGNOSIS — E538 Deficiency of other specified B group vitamins: Secondary | ICD-10-CM

## 2023-09-02 DIAGNOSIS — D509 Iron deficiency anemia, unspecified: Secondary | ICD-10-CM

## 2023-09-02 DIAGNOSIS — D500 Iron deficiency anemia secondary to blood loss (chronic): Secondary | ICD-10-CM

## 2023-09-02 DIAGNOSIS — K922 Gastrointestinal hemorrhage, unspecified: Secondary | ICD-10-CM | POA: Diagnosis present

## 2023-09-02 LAB — CBC WITH DIFFERENTIAL (CANCER CENTER ONLY)
Abs Immature Granulocytes: 0.04 10*3/uL (ref 0.00–0.07)
Basophils Absolute: 0.1 10*3/uL (ref 0.0–0.1)
Basophils Relative: 1 %
Eosinophils Absolute: 0.4 10*3/uL (ref 0.0–0.5)
Eosinophils Relative: 6 %
HCT: 37.9 % (ref 36.0–46.0)
Hemoglobin: 12.3 g/dL (ref 12.0–15.0)
Immature Granulocytes: 1 %
Lymphocytes Relative: 24 %
Lymphs Abs: 1.8 10*3/uL (ref 0.7–4.0)
MCH: 26.9 pg (ref 26.0–34.0)
MCHC: 32.5 g/dL (ref 30.0–36.0)
MCV: 82.8 fL (ref 80.0–100.0)
Monocytes Absolute: 0.6 10*3/uL (ref 0.1–1.0)
Monocytes Relative: 8 %
Neutro Abs: 4.4 10*3/uL (ref 1.7–7.7)
Neutrophils Relative %: 60 %
Platelet Count: 309 10*3/uL (ref 150–400)
RBC: 4.58 MIL/uL (ref 3.87–5.11)
RDW: 13.5 % (ref 11.5–15.5)
WBC Count: 7.3 10*3/uL (ref 4.0–10.5)
nRBC: 0 % (ref 0.0–0.2)

## 2023-09-02 LAB — FERRITIN: Ferritin: 16 ng/mL (ref 11–307)

## 2023-09-02 LAB — IRON AND IRON BINDING CAPACITY (CC-WL,HP ONLY)
Iron: 55 ug/dL (ref 28–170)
Saturation Ratios: 11 % (ref 10.4–31.8)
TIBC: 494 ug/dL — ABNORMAL HIGH (ref 250–450)
UIBC: 439 ug/dL (ref 148–442)

## 2023-09-02 LAB — VITAMIN B12: Vitamin B-12: 240 pg/mL (ref 180–914)

## 2023-09-02 LAB — RETICULOCYTES
Immature Retic Fract: 13.3 % (ref 2.3–15.9)
RBC.: 4.51 MIL/uL (ref 3.87–5.11)
Retic Count, Absolute: 57.7 10*3/uL (ref 19.0–186.0)
Retic Ct Pct: 1.3 % (ref 0.4–3.1)

## 2023-09-02 NOTE — Progress Notes (Signed)
 Hematology and Oncology Follow Up Visit  Amanda [name] [id number] 1966-02-21 58 y.o. 09/02/2023   Principle Diagnosis:  Chronic iron  deficiency anemia     Current Therapy:        IV iron  as indicated               Interim History:  Amanda [name] is here today for follow-up. She is doing well but notes fatigue, dizziness, SOB with exertion and occasional palpitations.  No blood loss noted. No abnormal bruising or petechiae.  She was able to go to the beach with her daughters last week and had a great time.  Minimal puffiness in her feet and ankles that comes and goes. Pedal pulses are 2+ and no pitting or redness noted on exam.  No falls or syncope reported.  No fever, chills, n/v, cough, rash, chest pain, abdominal pain or changes in bowel or bladder habits.  Appetite and hydration are good. Weight is stable at 229 lbs.   ECOG Performance Status: 1 - Symptomatic but completely ambulatory  Medications:  Allergies as of 09/02/2023   No Known Allergies      Medication List        Accurate as of Sep 02, 2023 10:41 AM. If you have any questions, ask your nurse or doctor.          STOP taking these medications    pantoprazole  40 MG tablet Commonly known as: PROTONIX  Stopped by: Kennard Pea       TAKE these medications    ACCRUFeR  30 MG Caps Generic drug: Ferric Maltol  1 po bid   b complex vitamins tablet Take 1 tablet by mouth daily.   cetirizine 10 MG tablet Commonly known as: ZYRTEC Take 10 mg by mouth daily.   esomeprazole  40 MG capsule Commonly known as: NEXIUM  Take 40 mg by mouth daily.   phentermine  37.5 MG tablet Commonly known as: ADIPEX-P  TAKE ONE TABLET BY MOUTH ONE TIME DAILY BEFORE BREAKFAST **SCHEDULE OFFICE VISIT**   traMADol  50 MG tablet Commonly known as: ULTRAM  Take 1 tablet (50 mg total) by mouth every 6 (six) hours as needed.   triamcinolone  cream 0.5 % Commonly known as: KENALOG  Apply topically 2 (two) times daily.   valACYclovir  500  MG tablet Commonly known as: VALTREX  TAKE ONE TABLET BY MOUTH TWICE A DAY FOR 7 DAYS AS NEEDED FOR COLD SORES   Vitamin B-12 1000 MCG Subl Place 2 tablets (2,000 mcg total) under the tongue daily.   Vitamin D3 50 MCG (2000 UT) capsule Take 1 capsule (2,000 Units total) by mouth daily.   zolpidem  5 MG tablet Commonly known as: AMBIEN  TAKE ONE-HALF TO ONE TABLET BY MOUTH AT BEDTIME AS NEEDED FOR SLEEP        Allergies: No Known Allergies  Past Medical History, Surgical history, Social history, and Family History were reviewed and updated.  Review of Systems: All other 10 point review of systems is negative.   Physical Exam:  vitals were not taken for this visit.   Wt Readings from Last 3 Encounters:  04/29/23 226 lb (102.5 kg)  03/30/23 226 lb (102.5 kg)  02/08/23 218 lb (98.9 kg)    Ocular: Sclerae unicteric, pupils equal, round and reactive to light Ear-nose-throat: Oropharynx clear, dentition fair Lymphatic: No cervical or supraclavicular adenopathy Lungs no rales or rhonchi, good excursion bilaterally Heart regular rate and rhythm, no murmur appreciated Abd soft, nontender, positive bowel sounds MSK no focal spinal tenderness, no joint edema Neuro: non-focal, well-oriented, appropriate  affect Breasts: Deferred   Lab Results  Component Value Date   WBC 7.3 09/02/2023   HGB 12.3 09/02/2023   HCT 37.9 09/02/2023   MCV 82.8 09/02/2023   PLT 309 09/02/2023   Lab Results  Component Value Date   FERRITIN 130 04/29/2023   IRON  80 04/29/2023   TIBC 363 04/29/2023   UIBC 283 04/29/2023   IRONPCTSAT 22 04/29/2023   Lab Results  Component Value Date   RETICCTPCT 1.3 09/02/2023   RBC 4.58 09/02/2023   RBC 4.51 09/02/2023   No results found for: "KPAFRELGTCHN", "LAMBDASER", "KAPLAMBRATIO" No results found for: "IGGSERUM", "IGA", "IGMSERUM" No results found for: "TOTALPROTELP", "ALBUMINELP", "A1GS", "A2GS", "BETS", "BETA2SER", "GAMS", "MSPIKE", "SPEI"    Chemistry      Component Value Date/Time   NA 142 04/29/2023 0932   K 4.1 04/29/2023 0932   CL 106 04/29/2023 0932   CO2 27 04/29/2023 0932   BUN 17 04/29/2023 0932   CREATININE 0.86 04/29/2023 0932      Component Value Date/Time   CALCIUM 9.3 04/29/2023 0932   ALKPHOS 51 04/29/2023 0932   AST 16 04/29/2023 0932   ALT 16 04/29/2023 0932   BILITOT 0.3 04/29/2023 0932       Impression and Plan: Amanda [name] is a pleasant 58 yo [place] female with chronic iron  deficiency anemia felt to be due to intermittent GI blood loss.  Iron  studies and B 12 are pending. We will replace if needed.  Follow-up in 2 months.   Kennard Pea, NP 5/2/202510:41 AM

## 2023-10-08 ENCOUNTER — Other Ambulatory Visit: Payer: Self-pay | Admitting: Internal Medicine

## 2023-11-11 ENCOUNTER — Inpatient Hospital Stay: Payer: Self-pay

## 2023-11-11 ENCOUNTER — Inpatient Hospital Stay: Admitting: Family

## 2024-01-10 ENCOUNTER — Inpatient Hospital Stay (HOSPITAL_BASED_OUTPATIENT_CLINIC_OR_DEPARTMENT_OTHER): Payer: Self-pay | Admitting: Family

## 2024-01-10 ENCOUNTER — Inpatient Hospital Stay: Payer: Self-pay | Attending: Hematology & Oncology

## 2024-01-10 VITALS — BP 132/89 | HR 92 | Temp 97.9°F | Resp 19 | Ht 62.0 in | Wt 226.4 lb

## 2024-01-10 DIAGNOSIS — D509 Iron deficiency anemia, unspecified: Secondary | ICD-10-CM | POA: Diagnosis present

## 2024-01-10 DIAGNOSIS — E538 Deficiency of other specified B group vitamins: Secondary | ICD-10-CM

## 2024-01-10 LAB — CBC WITH DIFFERENTIAL (CANCER CENTER ONLY)
Abs Immature Granulocytes: 0.06 K/uL (ref 0.00–0.07)
Basophils Absolute: 0.1 K/uL (ref 0.0–0.1)
Basophils Relative: 1 %
Eosinophils Absolute: 0.2 K/uL (ref 0.0–0.5)
Eosinophils Relative: 4 %
HCT: 32.2 % — ABNORMAL LOW (ref 36.0–46.0)
Hemoglobin: 9.4 g/dL — ABNORMAL LOW (ref 12.0–15.0)
Immature Granulocytes: 1 %
Lymphocytes Relative: 28 %
Lymphs Abs: 1.6 K/uL (ref 0.7–4.0)
MCH: 20.9 pg — ABNORMAL LOW (ref 26.0–34.0)
MCHC: 29.2 g/dL — ABNORMAL LOW (ref 30.0–36.0)
MCV: 71.6 fL — ABNORMAL LOW (ref 80.0–100.0)
Monocytes Absolute: 0.5 K/uL (ref 0.1–1.0)
Monocytes Relative: 10 %
Neutro Abs: 3.1 K/uL (ref 1.7–7.7)
Neutrophils Relative %: 56 %
Platelet Count: 366 K/uL (ref 150–400)
RBC: 4.5 MIL/uL (ref 3.87–5.11)
RDW: 15.9 % — ABNORMAL HIGH (ref 11.5–15.5)
WBC Count: 5.6 K/uL (ref 4.0–10.5)
nRBC: 0 % (ref 0.0–0.2)

## 2024-01-10 LAB — IRON AND IRON BINDING CAPACITY (CC-WL,HP ONLY)
Iron: 34 ug/dL (ref 28–170)
Saturation Ratios: 6 % — ABNORMAL LOW (ref 10.4–31.8)
TIBC: 556 ug/dL — ABNORMAL HIGH (ref 250–450)
UIBC: 522 ug/dL

## 2024-01-10 LAB — RETICULOCYTES
Immature Retic Fract: 19.5 % — ABNORMAL HIGH (ref 2.3–15.9)
RBC.: 4.49 MIL/uL (ref 3.87–5.11)
Retic Count, Absolute: 59.7 K/uL (ref 19.0–186.0)
Retic Ct Pct: 1.3 % (ref 0.4–3.1)

## 2024-01-10 LAB — VITAMIN B12: Vitamin B-12: 390 pg/mL (ref 180–914)

## 2024-01-10 LAB — FERRITIN: Ferritin: 3 ng/mL — ABNORMAL LOW (ref 11–307)

## 2024-01-10 NOTE — Progress Notes (Signed)
 Hematology and Oncology Follow Up Visit  Amanda [name] [id number] February 26, 1966 58 y.o. 01/10/2024   Principle Diagnosis:  Chronic iron  deficiency anemia     Current Therapy:        IV iron  as indicated   Interim History:  Amanda [name] is here today for follow-up. She is symptomatic with fatigue, lightheadedness, SOB with exertion and palpitations.  She has not noted any obvious blood loss. No bruising or petechiae.  No fever, chills, n/v, cough, rash, chest pain, abdominal pain or changes in bowel or bladder habits.  She notes intermittent numbness and tingling and fluid retention in her feet. None noted at this time.  No falls or syncope reported.  Appetite and hydration are good. Weight is stable at 226 lbs.   ECOG Performance Status: 1 - Symptomatic but completely ambulatory  Medications:  Allergies as of 01/10/2024   No Known Allergies      Medication List        Accurate as of January 10, 2024  1:17 PM. If you have any questions, ask your nurse or doctor.          ACCRUFeR  30 MG Caps Generic drug: Ferric Maltol  1 po bid   b complex vitamins tablet Take 1 tablet by mouth daily.   cetirizine 10 MG tablet Commonly known as: ZYRTEC Take 10 mg by mouth daily.   esomeprazole  40 MG capsule Commonly known as: NEXIUM  Take 40 mg by mouth daily.   phentermine  37.5 MG tablet Commonly known as: ADIPEX-P  TAKE ONE TABLET BY MOUTH ONE TIME DAILY BEFORE BREAKFAST **SCHEDULE OFFICE VISIT**   traMADol  50 MG tablet Commonly known as: ULTRAM  Take 1 tablet (50 mg total) by mouth every 6 (six) hours as needed.   triamcinolone  cream 0.5 % Commonly known as: KENALOG  APPLY TO THE AFFECTED AREA(S) TOPICALLY FOUR TIMES DAILY   valACYclovir  500 MG tablet Commonly known as: VALTREX  TAKE ONE TABLET BY MOUTH TWICE A DAY FOR 7 DAYS AS NEEDED FOR COLD SORES   Vitamin B-12 1000 MCG Subl Place 2 tablets (2,000 mcg total) under the tongue daily.   Vitamin D3 50 MCG (2000 UT)  capsule Take 1 capsule (2,000 Units total) by mouth daily.   zolpidem  5 MG tablet Commonly known as: AMBIEN  TAKE ONE-HALF TO ONE TABLET BY MOUTH AT BEDTIME AS NEEDED FOR SLEEP        Allergies: No Known Allergies  Past Medical History, Surgical history, Social history, and Family History were reviewed and updated.  Review of Systems: All other 10 point review of systems is negative.   Physical Exam:  vitals were not taken for this visit.   Wt Readings from Last 3 Encounters:  09/02/23 229 lb 12.8 oz (104.2 kg)  04/29/23 226 lb (102.5 kg)  03/30/23 226 lb (102.5 kg)    Ocular: Sclerae unicteric, pupils equal, round and reactive to light Ear-nose-throat: Oropharynx clear, dentition fair Lymphatic: No cervical or supraclavicular adenopathy Lungs no rales or rhonchi, good excursion bilaterally Heart regular rate and rhythm, no murmur appreciated Abd soft, nontender, positive bowel sounds MSK no focal spinal tenderness, no joint edema Neuro: non-focal, well-oriented, appropriate affect Breasts: Deferred   Lab Results  Component Value Date   WBC 5.6 01/10/2024   HGB 9.4 (L) 01/10/2024   HCT 32.2 (L) 01/10/2024   MCV 71.6 (L) 01/10/2024   PLT 366 01/10/2024   Lab Results  Component Value Date   FERRITIN 16 09/02/2023   IRON  55 09/02/2023   TIBC 494 (H)  09/02/2023   UIBC 439 09/02/2023   IRONPCTSAT 11 09/02/2023   Lab Results  Component Value Date   RETICCTPCT 1.3 01/10/2024   RBC 4.50 01/10/2024   RBC 4.49 01/10/2024   No results found for: KPAFRELGTCHN, LAMBDASER, KAPLAMBRATIO No results found for: IGGSERUM, IGA, IGMSERUM No results found for: STEPHANY CARLOTA BENSON MARKEL EARLA JOANNIE DOC VICK, SPEI   Chemistry      Component Value Date/Time   NA 142 04/29/2023 0932   K 4.1 04/29/2023 0932   CL 106 04/29/2023 0932   CO2 27 04/29/2023 0932   BUN 17 04/29/2023 0932   CREATININE 0.86 04/29/2023 0932       Component Value Date/Time   CALCIUM 9.3 04/29/2023 0932   ALKPHOS 51 04/29/2023 0932   AST 16 04/29/2023 0932   ALT 16 04/29/2023 0932   BILITOT 0.3 04/29/2023 0932       Impression and Plan: Amanda [name] is a pleasant 58 yo [place] female with chronic iron  deficiency anemia felt to be due to intermittent GI blood loss.  Iron  studies and B 12 are pending. We will replace if needed.  Follow-up in 2 months.    Lauraine Pepper, NP 9/9/20251:17 PM

## 2024-01-11 ENCOUNTER — Ambulatory Visit: Admitting: Family

## 2024-01-11 ENCOUNTER — Other Ambulatory Visit

## 2024-01-17 ENCOUNTER — Inpatient Hospital Stay

## 2024-01-17 VITALS — BP 128/77 | HR 91 | Temp 97.7°F | Resp 18

## 2024-01-17 DIAGNOSIS — D509 Iron deficiency anemia, unspecified: Secondary | ICD-10-CM

## 2024-01-17 MED ORDER — SODIUM CHLORIDE 0.9 % IV SOLN
510.0000 mg | Freq: Once | INTRAVENOUS | Status: AC
  Administered 2024-01-17: 510 mg via INTRAVENOUS
  Filled 2024-01-17: qty 510

## 2024-01-17 MED ORDER — SODIUM CHLORIDE 0.9 % IV SOLN: Freq: Once | INTRAVENOUS | Status: AC

## 2024-01-24 ENCOUNTER — Inpatient Hospital Stay

## 2024-01-31 ENCOUNTER — Inpatient Hospital Stay

## 2024-01-31 VITALS — BP 139/76 | HR 69 | Resp 17

## 2024-01-31 DIAGNOSIS — D509 Iron deficiency anemia, unspecified: Secondary | ICD-10-CM

## 2024-01-31 MED ORDER — SODIUM CHLORIDE 0.9 % IV SOLN: Freq: Once | INTRAVENOUS | Status: AC

## 2024-01-31 MED ORDER — SODIUM CHLORIDE 0.9 % IV SOLN
510.0000 mg | Freq: Once | INTRAVENOUS | Status: AC
  Administered 2024-01-31: 510 mg via INTRAVENOUS
  Filled 2024-01-31: qty 17

## 2024-01-31 NOTE — Patient Instructions (Signed)

## 2024-03-13 ENCOUNTER — Inpatient Hospital Stay: Admitting: Family

## 2024-03-13 ENCOUNTER — Inpatient Hospital Stay: Payer: Self-pay

## 2024-04-25 ENCOUNTER — Encounter: Payer: Self-pay | Admitting: Family

## 2024-04-26 ENCOUNTER — Encounter: Payer: Self-pay | Admitting: Family

## 2024-05-01 ENCOUNTER — Inpatient Hospital Stay: Attending: Hematology & Oncology

## 2024-05-01 ENCOUNTER — Other Ambulatory Visit: Payer: Self-pay

## 2024-05-01 ENCOUNTER — Inpatient Hospital Stay

## 2024-05-01 ENCOUNTER — Encounter: Payer: Self-pay | Admitting: Family

## 2024-05-01 ENCOUNTER — Inpatient Hospital Stay: Payer: Self-pay | Admitting: Family

## 2024-05-01 VITALS — BP 135/82 | HR 88 | Temp 97.7°F | Resp 16 | Ht 62.0 in | Wt 226.0 lb

## 2024-05-01 VITALS — BP 139/63 | HR 77 | Resp 18

## 2024-05-01 DIAGNOSIS — D509 Iron deficiency anemia, unspecified: Secondary | ICD-10-CM | POA: Insufficient documentation

## 2024-05-01 DIAGNOSIS — E538 Deficiency of other specified B group vitamins: Secondary | ICD-10-CM | POA: Insufficient documentation

## 2024-05-01 DIAGNOSIS — D500 Iron deficiency anemia secondary to blood loss (chronic): Secondary | ICD-10-CM | POA: Diagnosis not present

## 2024-05-01 LAB — CBC WITH DIFFERENTIAL (CANCER CENTER ONLY)
Abs Immature Granulocytes: 0.09 K/uL — ABNORMAL HIGH (ref 0.00–0.07)
Basophils Absolute: 0.1 K/uL (ref 0.0–0.1)
Basophils Relative: 1 %
Eosinophils Absolute: 0.2 K/uL (ref 0.0–0.5)
Eosinophils Relative: 4 %
HCT: 27 % — ABNORMAL LOW (ref 36.0–46.0)
Hemoglobin: 8.1 g/dL — ABNORMAL LOW (ref 12.0–15.0)
Immature Granulocytes: 1 %
Lymphocytes Relative: 25 %
Lymphs Abs: 1.5 K/uL (ref 0.7–4.0)
MCH: 21.1 pg — ABNORMAL LOW (ref 26.0–34.0)
MCHC: 30 g/dL (ref 30.0–36.0)
MCV: 70.5 fL — ABNORMAL LOW (ref 80.0–100.0)
Monocytes Absolute: 0.6 K/uL (ref 0.1–1.0)
Monocytes Relative: 9 %
Neutro Abs: 3.8 K/uL (ref 1.7–7.7)
Neutrophils Relative %: 60 %
Platelet Count: 404 K/uL — ABNORMAL HIGH (ref 150–400)
RBC: 3.83 MIL/uL — ABNORMAL LOW (ref 3.87–5.11)
RDW: 18.2 % — ABNORMAL HIGH (ref 11.5–15.5)
WBC Count: 6.3 K/uL (ref 4.0–10.5)
nRBC: 0 % (ref 0.0–0.2)

## 2024-05-01 LAB — IRON AND IRON BINDING CAPACITY (CC-WL,HP ONLY)
Iron: 14 ug/dL — ABNORMAL LOW (ref 28–170)
Saturation Ratios: 3 % — ABNORMAL LOW (ref 10.4–31.8)
TIBC: 546 ug/dL — ABNORMAL HIGH (ref 250–450)
UIBC: 532 ug/dL

## 2024-05-01 LAB — RETICULOCYTES
Immature Retic Fract: 33.2 % — ABNORMAL HIGH (ref 2.3–15.9)
RBC.: 3.85 MIL/uL — ABNORMAL LOW (ref 3.87–5.11)
Retic Count, Absolute: 68.9 K/uL (ref 19.0–186.0)
Retic Ct Pct: 1.8 % (ref 0.4–3.1)

## 2024-05-01 LAB — VITAMIN B12: Vitamin B-12: 425 pg/mL (ref 180–914)

## 2024-05-01 LAB — FERRITIN: Ferritin: 7 ng/mL — ABNORMAL LOW (ref 11–307)

## 2024-05-01 MED ORDER — CYANOCOBALAMIN 1000 MCG/ML IJ SOLN
1000.0000 ug | Freq: Once | INTRAMUSCULAR | Status: AC
  Administered 2024-05-01: 1000 ug via INTRAMUSCULAR
  Filled 2024-05-01: qty 1

## 2024-05-01 MED ORDER — SODIUM CHLORIDE 0.9 % IV SOLN
510.0000 mg | Freq: Once | INTRAVENOUS | Status: AC
  Administered 2024-05-01: 510 mg via INTRAVENOUS
  Filled 2024-05-01: qty 17

## 2024-05-01 MED ORDER — SODIUM CHLORIDE 0.9 % IV SOLN: Freq: Once | INTRAVENOUS | Status: AC

## 2024-05-01 NOTE — Progress Notes (Addendum)
 " Hematology and Oncology Follow Up Visit  Cadey Bazile [id number] May 31, 1965 58 y.o. 05/01/2024   Principle Diagnosis:  Chronic iron  deficiency anemia     Current Therapy:        IV iron  as indicated               Interim History:  Ms. Litke is here today for follow-up. She is symptomatic with fatigue, weakness, SOB with exertion, dizziness, nausea without vomiting and feeling tightness in her fingers.  No falls or syncope reported.  No fever, chills, cough, rash, chest pain, palpitations, abdominal pain or changes in bowel or bladder habits at this time.  She has GERD and takes pepto bismol as needed.  Appetite and hydration are good. Weight is stable at 226 lbs.   ECOG Performance Status: 1 - Symptomatic but completely ambulatory  Medications:  Allergies as of 05/01/2024   No Known Allergies      Medication List        Accurate as of May 01, 2024  1:37 PM. If you have any questions, ask your nurse or doctor.          ACCRUFeR  30 MG Caps Generic drug: Ferric Maltol  1 po bid   b complex vitamins tablet Take 1 tablet by mouth daily.   cetirizine 10 MG tablet Commonly known as: ZYRTEC Take 10 mg by mouth daily.   esomeprazole  40 MG capsule Commonly known as: NEXIUM  Take 40 mg by mouth daily.   phentermine  37.5 MG tablet Commonly known as: ADIPEX-P  TAKE ONE TABLET BY MOUTH ONE TIME DAILY BEFORE BREAKFAST **SCHEDULE OFFICE VISIT**   traMADol  50 MG tablet Commonly known as: ULTRAM  Take 1 tablet (50 mg total) by mouth every 6 (six) hours as needed.   triamcinolone  cream 0.5 % Commonly known as: KENALOG  APPLY TO THE AFFECTED AREA(S) TOPICALLY FOUR TIMES DAILY   valACYclovir  500 MG tablet Commonly known as: VALTREX  TAKE ONE TABLET BY MOUTH TWICE A DAY FOR 7 DAYS AS NEEDED FOR COLD SORES   Vitamin B-12 1000 MCG Subl Place 2 tablets (2,000 mcg total) under the tongue daily.   Vitamin D3 50 MCG (2000 UT) capsule Take 1 capsule (2,000 Units total) by  mouth daily.   zolpidem  5 MG tablet Commonly known as: AMBIEN  TAKE ONE-HALF TO ONE TABLET BY MOUTH AT BEDTIME AS NEEDED FOR SLEEP        Allergies: Allergies[1]  Past Medical History, Surgical history, Social history, and Family History were reviewed and updated.  Review of Systems: All other 10 point review of systems is negative.   Physical Exam:  vitals were not taken for this visit.   Wt Readings from Last 3 Encounters:  01/10/24 226 lb 6.4 oz (102.7 kg)  09/02/23 229 lb 12.8 oz (104.2 kg)  04/29/23 226 lb (102.5 kg)    Ocular: Sclerae unicteric, pupils equal, round and reactive to light Ear-nose-throat: Oropharynx clear, dentition fair Lymphatic: No cervical or supraclavicular adenopathy Lungs no rales or rhonchi, good excursion bilaterally Heart regular rate and rhythm, no murmur appreciated Abd soft, nontender, positive bowel sounds MSK no focal spinal tenderness, no joint edema Neuro: non-focal, well-oriented, appropriate affect Breasts: Deferred   Lab Results  Component Value Date   WBC 6.3 05/01/2024   HGB 8.1 (L) 05/01/2024   HCT 27.0 (L) 05/01/2024   MCV 70.5 (L) 05/01/2024   PLT 404 (H) 05/01/2024   Lab Results  Component Value Date   FERRITIN 3 (L) 01/10/2024   IRON  34 01/10/2024  TIBC 556 (H) 01/10/2024   UIBC 522 01/10/2024   IRONPCTSAT 6 (L) 01/10/2024   Lab Results  Component Value Date   RETICCTPCT 1.8 05/01/2024   RBC 3.83 (L) 05/01/2024   RBC 3.85 (L) 05/01/2024   No results found for: KPAFRELGTCHN, LAMBDASER, KAPLAMBRATIO No results found for: IGGSERUM, IGA, IGMSERUM No results found for: STEPHANY RINGS, A1GS, MARKEL EARLA JOANNIE DOC VICK, SPEI   Chemistry      Component Value Date/Time   NA 142 04/29/2023 0932   K 4.1 04/29/2023 0932   CL 106 04/29/2023 0932   CO2 27 04/29/2023 0932   BUN 17 04/29/2023 0932   CREATININE 0.86 04/29/2023 0932      Component Value Date/Time    CALCIUM 9.3 04/29/2023 0932   ALKPHOS 51 04/29/2023 0932   AST 16 04/29/2023 0932   ALT 16 04/29/2023 0932   BILITOT 0.3 04/29/2023 0932       Impression and Plan: Ms. Niday is a pleasant 58 yo [place] female with chronic iron  deficiency anemia felt to be due to intermittent GI blood loss.  We will get her onto monthly B 12 injections.  IV iron  given today. She will get a second dose next week.  She was given supplies to collect 3 stool specimens to assess for occult blood. We will get her back over to GI if positive.  Follow-up in 6 weeks.    Lauraine Pepper, NP 12/30/20251:37 PM     [1] No Known Allergies  "

## 2024-05-01 NOTE — Progress Notes (Signed)
 Pt refused to stay  for 30 minute post infusion monitoring peroid. VSS at time of discharge. Steady gait noted on discharge.

## 2024-05-01 NOTE — Patient Instructions (Signed)
 Ferumoxytol Injection What is this medication? FERUMOXYTOL (FER ue MOX i tol) treats low levels of iron in your body (iron deficiency anemia). Iron is a mineral that plays an important role in making red blood cells, which carry oxygen from your lungs to the rest of your body. This medicine may be used for other purposes; ask your health care provider or pharmacist if you have questions. COMMON BRAND NAME(S): Feraheme What should I tell my care team before I take this medication? They need to know if you have any of these conditions: Anemia not caused by low iron levels High levels of iron in the blood Magnetic resonance imaging (MRI) test scheduled An unusual or allergic reaction to iron, other medications, foods, dyes, or preservatives Pregnant or trying to get pregnant Breastfeeding How should I use this medication? This medication is injected into a vein. It is given by your care team in a hospital or clinic setting. Talk to your care team the use of this medication in children. Special care may be needed. Overdosage: If you think you have taken too much of this medicine contact a poison control center or emergency room at once. NOTE: This medicine is only for you. Do not share this medicine with others. What if I miss a dose? It is important not to miss your dose. Call your care team if you are unable to keep an appointment. What may interact with this medication? Other iron products This list may not describe all possible interactions. Give your health care provider a list of all the medicines, herbs, non-prescription drugs, or dietary supplements you use. Also tell them if you smoke, drink alcohol, or use illegal drugs. Some items may interact with your medicine. What should I watch for while using this medication? Visit your care team for regular checks on your progress. Tell your care team if your symptoms do not start to get better or if they get worse. You may need blood work done  while you are taking this medication. You may need to eat more foods that contain iron. Talk to your care team. Foods that contain iron include whole grains or cereals, dried fruits, beans, peas, leafy green vegetables, and organ meats (liver, kidney). What side effects may I notice from receiving this medication? Side effects that you should report to your care team as soon as possible: Allergic reactions--skin rash, itching, hives, swelling of the face, lips, tongue, or throat Low blood pressure--dizziness, feeling faint or lightheaded, blurry vision Shortness of breath Side effects that usually do not require medical attention (report to your care team if they continue or are bothersome): Flushing Headache Joint pain Muscle pain Nausea Pain, redness, or irritation at injection site This list may not describe all possible side effects. Call your doctor for medical advice about side effects. You may report side effects to FDA at 1-800-FDA-1088. Where should I keep my medication? This medication is given in a hospital or clinic. It will not be stored at home. NOTE: This sheet is a summary. It may not cover all possible information. If you have questions about this medicine, talk to your doctor, pharmacist, or health care provider.  2024 Elsevier/Gold Standard (2022-12-08 00:00:00)  Vitamin B12 Injection What is this medication? Vitamin B12 (VAHY tuh min B12) prevents and treats low vitamin B12 levels in your body. It is used in people who do not get enough vitamin B12 from their diet or when their digestive tract does not absorb enough. Vitamin B12 plays an  important role in maintaining the health of your nervous system and red blood cells. This medicine may be used for other purposes; ask your health care provider or pharmacist if you have questions. COMMON BRAND NAME(S): B-12 Compliance Kit, B-12 Injection Kit, Cyomin, Dodex, LA-12, Nutri-Twelve, Physicians EZ Use B-12, Primabalt, Vitamin  Deficiency Injectable System - B12 What should I tell my care team before I take this medication? They need to know if you have any of these conditions: Kidney disease Leber's disease Megaloblastic anemia An unusual or allergic reaction to cyanocobalamin, cobalt, other medications, foods, dyes, or preservatives Pregnant or trying to get pregnant Breast-feeding How should I use this medication? This medication is injected into a muscle or deeply under the skin. It is usually given in a clinic or care team's office. However, your care team may teach you how to inject yourself. Follow all instructions. Talk to your care team about the use of this medication in children. Special care may be needed. Overdosage: If you think you have taken too much of this medicine contact a poison control center or emergency room at once. NOTE: This medicine is only for you. Do not share this medicine with others. What if I miss a dose? If you are given your dose at a clinic or care team's office, call to reschedule your appointment. If you give your own injections, and you miss a dose, take it as soon as you can. If it is almost time for your next dose, take only that dose. Do not take double or extra doses. What may interact with this medication? Alcohol Colchicine This list may not describe all possible interactions. Give your health care provider a list of all the medicines, herbs, non-prescription drugs, or dietary supplements you use. Also tell them if you smoke, drink alcohol, or use illegal drugs. Some items may interact with your medicine. What should I watch for while using this medication? Visit your care team regularly. You may need blood work done while you are taking this medication. You may need to follow a special diet. Talk to your care team. Limit your alcohol intake and avoid smoking to get the best benefit. What side effects may I notice from receiving this medication? Side effects that you  should report to your care team as soon as possible: Allergic reactions--skin rash, itching, hives, swelling of the face, lips, tongue, or throat Swelling of the ankles, hands, or feet Trouble breathing Side effects that usually do not require medical attention (report to your care team if they continue or are bothersome): Diarrhea This list may not describe all possible side effects. Call your doctor for medical advice about side effects. You may report side effects to FDA at 1-800-FDA-1088. Where should I keep my medication? Keep out of the reach of children. Store at room temperature between 15 and 30 degrees C (59 and 85 degrees F). Protect from light. Throw away any unused medication after the expiration date. NOTE: This sheet is a summary. It may not cover all possible information. If you have questions about this medicine, talk to your doctor, pharmacist, or health care provider.  2024 Elsevier/Gold Standard (2020-12-30 00:00:00)

## 2024-05-02 ENCOUNTER — Other Ambulatory Visit: Payer: Self-pay | Admitting: Family

## 2024-05-02 DIAGNOSIS — E538 Deficiency of other specified B group vitamins: Secondary | ICD-10-CM

## 2024-05-02 DIAGNOSIS — D509 Iron deficiency anemia, unspecified: Secondary | ICD-10-CM

## 2024-05-03 ENCOUNTER — Encounter: Payer: Self-pay | Admitting: Family

## 2024-05-08 ENCOUNTER — Inpatient Hospital Stay: Attending: Hematology & Oncology

## 2024-05-08 ENCOUNTER — Other Ambulatory Visit: Payer: Self-pay | Admitting: Family

## 2024-05-08 VITALS — BP 112/76 | HR 84 | Temp 97.9°F | Resp 18

## 2024-05-08 DIAGNOSIS — D509 Iron deficiency anemia, unspecified: Secondary | ICD-10-CM | POA: Insufficient documentation

## 2024-05-08 MED ORDER — SODIUM CHLORIDE 0.9 % IV SOLN
510.0000 mg | Freq: Once | INTRAVENOUS | Status: AC
  Administered 2024-05-08: 510 mg via INTRAVENOUS
  Filled 2024-05-08: qty 510

## 2024-05-08 MED ORDER — SODIUM CHLORIDE 0.9 % IV SOLN: Freq: Once | INTRAVENOUS | Status: AC

## 2024-05-08 NOTE — Progress Notes (Signed)
Patient refused to wait 30 minutes post infusion. Released stable and ASX. 

## 2024-05-08 NOTE — Patient Instructions (Signed)

## 2024-05-15 ENCOUNTER — Inpatient Hospital Stay: Payer: Self-pay | Admitting: Family

## 2024-05-15 ENCOUNTER — Inpatient Hospital Stay

## 2024-05-29 ENCOUNTER — Inpatient Hospital Stay

## 2024-06-12 ENCOUNTER — Inpatient Hospital Stay

## 2024-06-12 ENCOUNTER — Inpatient Hospital Stay: Admitting: Family

## 2024-06-26 ENCOUNTER — Inpatient Hospital Stay

## 2024-06-26 ENCOUNTER — Inpatient Hospital Stay: Admitting: Family
